# Patient Record
Sex: Female | Born: 1965 | Race: White | Hispanic: No | Marital: Married | Smoking: Never smoker
Health system: Southern US, Community
[De-identification: ages and names within clinical notes are randomized; demographics above are authoritative.]

## PROBLEM LIST (undated history)

## (undated) DIAGNOSIS — I739 Peripheral vascular disease, unspecified: Secondary | ICD-10-CM

## (undated) DIAGNOSIS — G43009 Migraine without aura, not intractable, without status migrainosus: Secondary | ICD-10-CM

## (undated) DIAGNOSIS — E559 Vitamin D deficiency, unspecified: Secondary | ICD-10-CM

## (undated) DIAGNOSIS — E034 Atrophy of thyroid (acquired): Secondary | ICD-10-CM

## (undated) DIAGNOSIS — E063 Autoimmune thyroiditis: Secondary | ICD-10-CM

## (undated) DIAGNOSIS — K219 Gastro-esophageal reflux disease without esophagitis: Secondary | ICD-10-CM

## (undated) DIAGNOSIS — R5382 Chronic fatigue, unspecified: Secondary | ICD-10-CM

## (undated) DIAGNOSIS — N951 Menopausal and female climacteric states: Secondary | ICD-10-CM

## (undated) DIAGNOSIS — F411 Generalized anxiety disorder: Secondary | ICD-10-CM

## (undated) HISTORY — DX: Vitamin D deficiency, unspecified: E55.9

## (undated) HISTORY — DX: Chronic fatigue, unspecified: R53.82

## (undated) HISTORY — DX: Menopausal and female climacteric states: N95.1

## (undated) HISTORY — DX: Gastro-esophageal reflux disease without esophagitis: K21.9

## (undated) HISTORY — PX: TONSILLECTOMY: SUR1361

## (undated) HISTORY — DX: Autoimmune thyroiditis: E06.3

## (undated) HISTORY — DX: Atrophy of thyroid (acquired): E03.4

## (undated) HISTORY — DX: Generalized anxiety disorder: F41.1

## (undated) HISTORY — DX: Migraine without aura, not intractable, without status migrainosus: G43.009

## (undated) SURGERY — Surgical Case
Anesthesia: *Unknown

---

## 1990-08-16 DIAGNOSIS — I739 Peripheral vascular disease, unspecified: Secondary | ICD-10-CM

## 1990-08-16 HISTORY — DX: Peripheral vascular disease, unspecified: I73.9

## 1998-11-07 ENCOUNTER — Other Ambulatory Visit: Admission: RE | Admit: 1998-11-07 | Discharge: 1998-11-07 | Payer: Self-pay | Admitting: Obstetrics and Gynecology

## 1999-12-08 ENCOUNTER — Other Ambulatory Visit: Admission: RE | Admit: 1999-12-08 | Discharge: 1999-12-08 | Payer: Self-pay | Admitting: Emergency Medicine

## 2000-12-13 ENCOUNTER — Other Ambulatory Visit: Admission: RE | Admit: 2000-12-13 | Discharge: 2000-12-13 | Payer: Self-pay | Admitting: *Deleted

## 2002-03-27 ENCOUNTER — Other Ambulatory Visit: Admission: RE | Admit: 2002-03-27 | Discharge: 2002-03-27 | Payer: Self-pay | Admitting: Obstetrics and Gynecology

## 2003-05-22 ENCOUNTER — Other Ambulatory Visit: Admission: RE | Admit: 2003-05-22 | Discharge: 2003-05-22 | Payer: Self-pay | Admitting: Obstetrics and Gynecology

## 2004-09-14 ENCOUNTER — Ambulatory Visit: Payer: Self-pay | Admitting: Family Medicine

## 2004-10-02 ENCOUNTER — Ambulatory Visit: Payer: Self-pay | Admitting: Family Medicine

## 2004-10-06 ENCOUNTER — Ambulatory Visit: Payer: Self-pay | Admitting: Family Medicine

## 2007-08-29 ENCOUNTER — Encounter: Admission: RE | Admit: 2007-08-29 | Discharge: 2007-08-29 | Payer: Self-pay | Admitting: Obstetrics and Gynecology

## 2007-08-31 ENCOUNTER — Encounter: Admission: RE | Admit: 2007-08-31 | Discharge: 2007-08-31 | Payer: Self-pay | Admitting: Obstetrics and Gynecology

## 2008-09-02 ENCOUNTER — Encounter: Admission: RE | Admit: 2008-09-02 | Discharge: 2008-09-02 | Payer: Self-pay | Admitting: Obstetrics and Gynecology

## 2009-09-04 ENCOUNTER — Encounter: Admission: RE | Admit: 2009-09-04 | Discharge: 2009-09-04 | Payer: Self-pay | Admitting: Obstetrics and Gynecology

## 2010-08-16 HISTORY — PX: OTHER SURGICAL HISTORY: SHX169

## 2010-09-06 ENCOUNTER — Encounter: Payer: Self-pay | Admitting: Obstetrics and Gynecology

## 2010-09-07 ENCOUNTER — Encounter
Admission: RE | Admit: 2010-09-07 | Discharge: 2010-09-07 | Payer: Self-pay | Source: Home / Self Care | Attending: Obstetrics and Gynecology | Admitting: Obstetrics and Gynecology

## 2011-01-13 ENCOUNTER — Ambulatory Visit
Admission: RE | Admit: 2011-01-13 | Discharge: 2011-01-13 | Disposition: A | Discharge status: Home / Self Care | Payer: 59 | Source: Ambulatory Visit | Attending: Obstetrics and Gynecology | Admitting: Obstetrics and Gynecology

## 2011-01-13 ENCOUNTER — Other Ambulatory Visit: Payer: Self-pay | Admitting: Obstetrics and Gynecology

## 2011-01-13 DIAGNOSIS — R109 Unspecified abdominal pain: Secondary | ICD-10-CM

## 2011-01-13 DIAGNOSIS — N200 Calculus of kidney: Secondary | ICD-10-CM

## 2011-01-13 DIAGNOSIS — N23 Unspecified renal colic: Secondary | ICD-10-CM

## 2011-03-17 ENCOUNTER — Encounter (HOSPITAL_COMMUNITY): Payer: Self-pay | Admitting: *Deleted

## 2011-04-07 NOTE — H&P (Signed)
[name], [name]               ACCOUNT NO.:  [id number]  MEDICAL RECORD NO.:  [id number]  LOCATION:  [facility]                         FACILITY:  [facility]  PHYSICIAN:  Lenoard Aden, M.D.DATE OF BIRTH:  1966/04/03  DATE OF ADMISSION:  03/16/2011 DATE OF DISCHARGE:                             HISTORY & PHYSICAL   CHIEF COMPLAINT:  Refractory dysfunctional uterine bleeding with normal labs and normal hysterogram.  HISTORY OF PRESENT ILLNESS:  This is a 45 year old white female G1, P1 with a history of negative workup with dysfunctional bleeding.  She has had normal TSH and normal sonohysterogram who now presents for definitive therapy.  MEDICATIONS:  Synthroid, Tums.  ALLERGIES:  She has no known drug allergies.  FAMILY HISTORY:  Chronic hypertension, diabetes, and myocardial infarction.  She has a history of multiple pregnancy.  SURGICAL HISTORY:  Remarkable for laser surgery.  PHYSICAL EXAMINATION:  GENERAL:  She is a well-developed, well-nourished white female, in no acute distress. VITAL SIGNS:  Blood pressure 154/84, weight of 170 pounds. HEENT:  Normal. NECK:  Supple.  Full range of motion. LUNGS:  Clear. HEART:  Regular rate and rhythm. ABDOMEN:  Soft and nontender. PELVIC:  Anteflexed uterus.  No adnexal masses. EXTREMITIES:  No cords. NEUROLOGIC:  Nonfocal. SKIN:  Intact.  IMPRESSION:  Refractory dysfunctional uterine bleeding with negative workup for __________and proceed with diagnostic __________ NovaSure endometrial ablation.  Risks of anesthesia, infection, bleeding, injury to abdominal organs __________delayed versus immediate complications of bowel and bladder __________.     Lenoard Aden, M.D.     RJT/MEDQ  D:  04/07/2011  T:  04/07/2011  Job:  [id number]

## 2011-04-08 ENCOUNTER — Other Ambulatory Visit: Payer: Self-pay | Admitting: Obstetrics and Gynecology

## 2011-04-08 ENCOUNTER — Encounter (HOSPITAL_COMMUNITY): Payer: Self-pay | Admitting: Anesthesiology

## 2011-04-08 ENCOUNTER — Encounter (HOSPITAL_COMMUNITY)
Admission: RE | Disposition: A | Discharge status: Home / Self Care | Payer: Self-pay | Source: Ambulatory Visit | Attending: Obstetrics and Gynecology

## 2011-04-08 ENCOUNTER — Ambulatory Visit (HOSPITAL_COMMUNITY): Payer: 59 | Admitting: Anesthesiology

## 2011-04-08 ENCOUNTER — Ambulatory Visit (HOSPITAL_COMMUNITY)
Admission: RE | Admit: 2011-04-08 | Discharge: 2011-04-08 | Disposition: A | Discharge status: Home / Self Care | Payer: 59 | Source: Ambulatory Visit | Attending: Obstetrics and Gynecology | Admitting: Obstetrics and Gynecology

## 2011-04-08 DIAGNOSIS — N840 Polyp of corpus uteri: Secondary | ICD-10-CM | POA: Insufficient documentation

## 2011-04-08 DIAGNOSIS — N925 Other specified irregular menstruation: Secondary | ICD-10-CM | POA: Insufficient documentation

## 2011-04-08 DIAGNOSIS — N938 Other specified abnormal uterine and vaginal bleeding: Secondary | ICD-10-CM | POA: Insufficient documentation

## 2011-04-08 DIAGNOSIS — N949 Unspecified condition associated with female genital organs and menstrual cycle: Secondary | ICD-10-CM | POA: Insufficient documentation

## 2011-04-08 DIAGNOSIS — N920 Excessive and frequent menstruation with regular cycle: Secondary | ICD-10-CM

## 2011-04-08 HISTORY — DX: Peripheral vascular disease, unspecified: I73.9

## 2011-04-08 LAB — CBC
HCT: 39.7 % (ref 36.0–46.0)
Hemoglobin: 13.4 g/dL (ref 12.0–15.0)
MCH: 32.5 pg (ref 26.0–34.0)
MCHC: 33.8 g/dL (ref 30.0–36.0)
Platelets: 288 10*3/uL (ref 150–400)

## 2011-04-08 SURGERY — DILATATION & CURETTAGE/HYSTEROSCOPY WITH NOVASURE ABLATION
Anesthesia: General | Site: Uterus | Wound class: Clean Contaminated

## 2011-04-08 MED ORDER — FENTANYL CITRATE 0.05 MG/ML IJ SOLN
INTRAMUSCULAR | Status: DC | PRN
  Administered 2011-04-08 (×2): 50 ug via INTRAVENOUS

## 2011-04-08 MED ORDER — ONDANSETRON HCL 4 MG/2ML IJ SOLN
INTRAMUSCULAR | Status: AC
  Filled 2011-04-08: qty 2

## 2011-04-08 MED ORDER — PROPOFOL 10 MG/ML IV EMUL
INTRAVENOUS | Status: AC
  Filled 2011-04-08: qty 20

## 2011-04-08 MED ORDER — LACTATED RINGERS IR SOLN
Status: DC | PRN
  Administered 2011-04-08: 3000 mL

## 2011-04-08 MED ORDER — KETOROLAC TROMETHAMINE 30 MG/ML IJ SOLN: 15.0000 mg | Freq: Once | INTRAMUSCULAR | Status: DC | PRN

## 2011-04-08 MED ORDER — FENTANYL CITRATE 0.05 MG/ML IJ SOLN
INTRAMUSCULAR | Status: AC
  Filled 2011-04-08: qty 2

## 2011-04-08 MED ORDER — LACTATED RINGERS IV SOLN
INTRAVENOUS | Status: DC
  Administered 2011-04-08: 10:00:00 via INTRAVENOUS

## 2011-04-08 MED ORDER — PROPOFOL 10 MG/ML IV EMUL
INTRAVENOUS | Status: DC | PRN
  Administered 2011-04-08: 170 mg via INTRAVENOUS
  Administered 2011-04-08: 30 mg via INTRAVENOUS

## 2011-04-08 MED ORDER — MIDAZOLAM HCL 5 MG/5ML IJ SOLN
INTRAMUSCULAR | Status: DC | PRN
  Administered 2011-04-08: 2 mg via INTRAVENOUS

## 2011-04-08 MED ORDER — KETOROLAC TROMETHAMINE 30 MG/ML IJ SOLN
INTRAMUSCULAR | Status: DC | PRN
  Administered 2011-04-08: 30 mg via INTRAVENOUS

## 2011-04-08 MED ORDER — MIDAZOLAM HCL 2 MG/2ML IJ SOLN
INTRAMUSCULAR | Status: AC
  Filled 2011-04-08: qty 2

## 2011-04-08 MED ORDER — KETOROLAC TROMETHAMINE 30 MG/ML IJ SOLN
INTRAMUSCULAR | Status: AC
  Filled 2011-04-08: qty 1

## 2011-04-08 MED ORDER — LIDOCAINE HCL (CARDIAC) 20 MG/ML IV SOLN
INTRAVENOUS | Status: AC
  Filled 2011-04-08: qty 5

## 2011-04-08 MED ORDER — DEXAMETHASONE SODIUM PHOSPHATE 4 MG/ML IJ SOLN
INTRAMUSCULAR | Status: DC | PRN
  Administered 2011-04-08: 10 mg via INTRAVENOUS

## 2011-04-08 MED ORDER — BUPIVACAINE HCL (PF) 0.25 % IJ SOLN
INTRAMUSCULAR | Status: DC | PRN
  Administered 2011-04-08: 30 mL

## 2011-04-08 MED ORDER — OXYCODONE-ACETAMINOPHEN 5-325 MG PO TABS: 1.0000 | ORAL_TABLET | ORAL | Status: AC | PRN

## 2011-04-08 MED ORDER — FENTANYL CITRATE 0.05 MG/ML IJ SOLN
25.0000 ug | INTRAMUSCULAR | Status: DC | PRN
  Administered 2011-04-08: 50 ug via INTRAVENOUS

## 2011-04-08 MED ORDER — ONDANSETRON HCL 4 MG/2ML IJ SOLN
INTRAMUSCULAR | Status: DC | PRN
  Administered 2011-04-08: 4 mg via INTRAVENOUS

## 2011-04-08 MED ORDER — LIDOCAINE HCL (CARDIAC) 20 MG/ML IV SOLN
INTRAVENOUS | Status: DC | PRN
  Administered 2011-04-08 (×2): 30 mg via INTRAVENOUS

## 2011-04-08 SURGICAL SUPPLY — 14 items
ABLATOR ENDOMETRIAL BIPOLAR (ABLATOR) ×2 IMPLANT
CATH ROBINSON RED A/P 16FR (CATHETERS) ×2 IMPLANT
CLOTH BEACON ORANGE TIMEOUT ST (SAFETY) ×2 IMPLANT
CONTAINER PREFILL 10% NBF 60ML (FORM) ×3 IMPLANT
GLOVE BIO SURGEON STRL SZ7.5 (GLOVE) ×4 IMPLANT
GOWN PREVENTION PLUS LG XLONG (DISPOSABLE) ×2 IMPLANT
GOWN PREVENTION PLUS XLARGE (GOWN DISPOSABLE) ×2 IMPLANT
NDL SPNL 22GX3.5 QUINCKE BK (NEEDLE) ×1 IMPLANT
NEEDLE SPNL 22GX3.5 QUINCKE BK (NEEDLE) ×2 IMPLANT
PACK HYSTEROSCOPY LF (CUSTOM PROCEDURE TRAY) ×2 IMPLANT
PAD PREP 24X48 CUFFED NSTRL (MISCELLANEOUS) ×2 IMPLANT
SYR TB 1ML 25GX5/8 (SYRINGE) ×1 IMPLANT
TOWEL OR 17X24 6PK STRL BLUE (TOWEL DISPOSABLE) ×4 IMPLANT
WATER STERILE IRR 1000ML POUR (IV SOLUTION) ×1 IMPLANT

## 2011-04-08 NOTE — Anesthesia Preprocedure Evaluation (Signed)
Anesthesia Evaluation  Name, MR# and DOB Patient awake  General Assessment Comment  Reviewed: Allergy & Precautions, H&P , NPO status , Patient's Chart, lab work & pertinent test results, reviewed documented beta blocker date and time   History of Anesthesia Complications Negative for: history of anesthetic complications  Airway Mallampati: II TM Distance: >3 FB Neck ROM: full    Dental  (+) Teeth Intact   Pulmonary  clear to auscultation  breath sounds clear to auscultation none    Cardiovascular Exercise Tolerance: Good regular Normal PE from DVT while on OCPs and leg in a cast, 1992PE from DVT while on OCPs and leg in a cast, 1992:     Neuro/Psych Negative Neurological ROS  Negative Psych ROS  GI/Hepatic/Renal negative GI ROS  negative Liver ROS  negative Renal ROS        Endo/Other  (+) Hypothyroidism,      Abdominal   Musculoskeletal   Hematology negative hematology ROS (+)   Peds  Reproductive/Obstetrics negative OB ROS    Anesthesia Other Findings             Anesthesia Physical Anesthesia Plan  ASA: II  Anesthesia Plan: General   Post-op Pain Management:    Induction:   Airway Management Planned:   Additional Equipment:   Intra-op Plan:   Post-operative Plan:   Informed Consent: I have reviewed the patients History and Physical, chart, labs and discussed the procedure including the risks, benefits and alternatives for the proposed anesthesia with the patient or authorized representative who has indicated his/her understanding and acceptance.   Dental Advisory Given  Plan Discussed with: CRNA and Surgeon  Anesthesia Plan Comments:         Anesthesia Quick Evaluation

## 2011-04-08 NOTE — Anesthesia Procedure Notes (Addendum)
Procedure Name: LMA Insertion Date/Time: 04/08/2011 10:54 AM Performed by: Suella Grove Pre-anesthesia Checklist: Patient identified, Patient being monitored, Timeout performed, Emergency Drugs available and Suction available Patient Re-evaluated:Patient Re-evaluated prior to inductionOxygen Delivery Method: Circle System Utilized Preoxygenation: Pre-oxygenation with 100% oxygen Intubation Type: IV induction Ventilation: Mask ventilation without difficulty LMA: LMA with gastric port inserted LMA Size: 3.0 Grade View: Grade I Number of attempts: 1 Placement Confirmation: breath sounds checked- equal and bilateral and positive ETCO2 Dental Injury: Teeth and Oropharynx as per pre-operative assessment

## 2011-04-08 NOTE — Op Note (Signed)
[name], [name]               ACCOUNT NO.:  [id number]  MEDICAL RECORD NO.:  [id number]  LOCATION:  [facility]                          FACILITY:  [facility]  PHYSICIAN:  Lenoard Aden, M.D.DATE OF BIRTH:  06-25-1966  DATE OF PROCEDURE:  04/08/2011 DATE OF DISCHARGE:  04/08/2011                              OPERATIVE REPORT   PREOPERATIVE DIAGNOSIS:  Refractory menometrorrhagia.  POSTOPERATIVE DIAGNOSIS:  Refractory menometrorrhagia plus endometrial polyp.  PROCEDURE:  Diagnostic hysteroscopy, D and C, NovaSure endometrial ablation, endometrial polypectomy.  SURGEON:  Lenoard Aden, MD  ASSISTANT:  None.  ANESTHESIA:  General and local.  ESTIMATED BLOOD LOSS:  Less than 50 mL.  FLUID DEFICIT:  50 mL.  COMPLICATIONS:  None.  DRAINS:  None.  COUNTS:  Correct.  The patient to recovery in good condition.  After being apprised of the risks of anesthesia, infection, bleeding, injury to abdominal organs, need for repair, delayed versus immediate complications include bowel and bladder injury, possible need for repair.  The patient was brought to the operating room where she was administered a general anesthetic without complications, prepped and draped in usual sterile fashion and catheterized until the bladder was emptied.  Exam under anesthesia revealed a small anteflexed uterus and no adnexal masses.  Cervix was then easily dilated up to a #21 Pratt dilator. After dilute paracervical block with 30 mL of a dilute Marcaine solution placed, at this time, the hysteroscope was placed.  Visualization reveals a small posterior wall endometrial polyp and bilateral normal tubal ostia and otherwise normal endometrial cavity.  Specimen was collected using sharp curettage in a four-quadrant method followed by endometrial polypectomy.  Specimen was sent together.  At this time, measurements were taken for the NovaSure device.  The NovaSure device was seated placed.  CO2 test was  done and was negative.  The NovaSure device was then initiated for a time of 70 seconds to a width of 4.5 and a length of 6.5 without difficulty. At this time, the NovaSure device was removed, intact and the hysteroscope reveals a well-ablated endometrial cavity and no further presence of endometrial polyp.  At this time, the procedure was terminated.  The patient tolerated procedure well, was awakened and transferred to recovery in good condition.     Lenoard Aden, M.D.     RJT/MEDQ  D:  04/08/2011  T:  04/08/2011  Job:  [id number]  cc:   Dr. Ma Hillock

## 2011-04-08 NOTE — Anesthesia Postprocedure Evaluation (Signed)
Anesthesia Post Note  Patient: [name]  Procedure(s) Performed:  DILATATION & CURETTAGE/HYSTEROSCOPY WITH NOVASURE ABLATION  Anesthesia type: General  Patient location: PACU  Post pain: Pain level controlled  Post assessment: Post-op Vital signs reviewed  Last Vitals:  Filed Vitals:   04/08/11 1224  BP:   Pulse:   Temp: 98.4 F (36.9 C)  Resp:     Post vital signs: Reviewed  Level of consciousness: sedated  Complications: No apparent anesthesia complications

## 2011-04-08 NOTE — Progress Notes (Signed)
  No changes. History and physical done.

## 2011-04-08 NOTE — Transfer of Care (Signed)
  Anesthesia Post-op Note  Patient: [name]  Procedure(s) Performed:  DILATATION & CURETTAGE/HYSTEROSCOPY WITH NOVASURE ABLATION  Patient Location: PACU  Anesthesia Type: General  Level of Consciousness: awake, alert , oriented, patient cooperative and responds to stimulation  Airway and Oxygen Therapy: Patient Spontanous Breathing and Patient connected to nasal cannula oxygen  Post-op Pain: none  Post-op Assessment: Post-op Vital signs reviewed, Patient's Cardiovascular Status Stable, Respiratory Function Stable and Pain level controlled  Post-op Vital Signs: Reviewed and stable  Complications: No apparent anesthesia complications

## 2011-04-08 NOTE — Op Note (Signed)
04/08/2011  11:15 AM  PATIENT:  [name]  45 y.o. female  PRE-OPERATIVE DIAGNOSIS:  Menorrhagia refractory to medical therapy  POST-OPERATIVE DIAGNOSIS:  Same plus Endometrial polyp  PROCEDURE:  Procedure(s): DILATATION & CURETTAGE/HYSTEROSCOPY WITH NOVASURE ABLATION Endometrial polypectomy SURGEON:  Surgeon(s): Lenoard Aden, MD  PHYSICIAN ASSISTANT:   ASSISTANTS: none   ANESTHESIA:   local and general  ESTIMATED BLOOD LOSS: * No blood loss amount entered *   BLOOD ADMINISTERED:none  DRAINS: none   LOCAL MEDICATIONS USED:  MARCAINE 30CC  SPECIMEN:  Source of Specimen:  EMC with polyp  DISPOSITION OF SPECIMEN:  PATHOLOGY  COUNTS:  YES  TOURNIQUET:  * No tourniquets in log *  DICTATION #: [id number]  PLAN OF CARE: na  PATIENT DISPOSITION:  PACU - hemodynamically stable.

## 2011-09-20 ENCOUNTER — Other Ambulatory Visit: Payer: Self-pay | Admitting: Obstetrics and Gynecology

## 2011-09-20 DIAGNOSIS — Z1231 Encounter for screening mammogram for malignant neoplasm of breast: Secondary | ICD-10-CM

## 2011-09-28 ENCOUNTER — Ambulatory Visit
Admission: RE | Admit: 2011-09-28 | Discharge: 2011-09-28 | Disposition: A | Discharge status: Home / Self Care | Payer: 59 | Source: Ambulatory Visit | Attending: Obstetrics and Gynecology | Admitting: Obstetrics and Gynecology

## 2011-09-28 DIAGNOSIS — Z1231 Encounter for screening mammogram for malignant neoplasm of breast: Secondary | ICD-10-CM

## 2012-09-19 ENCOUNTER — Other Ambulatory Visit: Payer: Self-pay | Admitting: Obstetrics and Gynecology

## 2012-09-19 DIAGNOSIS — Z803 Family history of malignant neoplasm of breast: Secondary | ICD-10-CM

## 2012-09-19 DIAGNOSIS — Z1231 Encounter for screening mammogram for malignant neoplasm of breast: Secondary | ICD-10-CM

## 2012-09-28 ENCOUNTER — Ambulatory Visit: Payer: 59

## 2012-10-30 ENCOUNTER — Ambulatory Visit
Admission: RE | Admit: 2012-10-30 | Discharge: 2012-10-30 | Disposition: A | Discharge status: Home / Self Care | Payer: 59 | Source: Ambulatory Visit | Attending: Obstetrics and Gynecology | Admitting: Obstetrics and Gynecology

## 2012-10-30 DIAGNOSIS — Z803 Family history of malignant neoplasm of breast: Secondary | ICD-10-CM

## 2012-10-30 DIAGNOSIS — Z1231 Encounter for screening mammogram for malignant neoplasm of breast: Secondary | ICD-10-CM

## 2013-08-30 ENCOUNTER — Other Ambulatory Visit: Payer: Self-pay

## 2013-08-30 DIAGNOSIS — Z1231 Encounter for screening mammogram for malignant neoplasm of breast: Secondary | ICD-10-CM

## 2013-11-01 ENCOUNTER — Ambulatory Visit
Admission: RE | Admit: 2013-11-01 | Discharge: 2013-11-01 | Disposition: A | Discharge status: Home / Self Care | Payer: 59 | Source: Ambulatory Visit

## 2013-11-01 DIAGNOSIS — Z1231 Encounter for screening mammogram for malignant neoplasm of breast: Secondary | ICD-10-CM

## 2014-10-01 ENCOUNTER — Other Ambulatory Visit: Payer: Self-pay

## 2014-10-01 DIAGNOSIS — Z1231 Encounter for screening mammogram for malignant neoplasm of breast: Secondary | ICD-10-CM

## 2014-11-04 ENCOUNTER — Ambulatory Visit: Payer: Self-pay

## 2014-12-15 HISTORY — PX: OTHER SURGICAL HISTORY: SHX169

## 2016-12-01 DIAGNOSIS — K219 Gastro-esophageal reflux disease without esophagitis: Secondary | ICD-10-CM | POA: Diagnosis not present

## 2016-12-01 DIAGNOSIS — R6889 Other general symptoms and signs: Secondary | ICD-10-CM | POA: Diagnosis not present

## 2016-12-07 DIAGNOSIS — Z01419 Encounter for gynecological examination (general) (routine) without abnormal findings: Secondary | ICD-10-CM | POA: Diagnosis not present

## 2016-12-07 DIAGNOSIS — R946 Abnormal results of thyroid function studies: Secondary | ICD-10-CM | POA: Diagnosis not present

## 2016-12-07 DIAGNOSIS — Z130 Encounter for screening for diseases of the blood and blood-forming organs and certain disorders involving the immune mechanism: Secondary | ICD-10-CM | POA: Diagnosis not present

## 2016-12-07 DIAGNOSIS — N951 Menopausal and female climacteric states: Secondary | ICD-10-CM | POA: Diagnosis not present

## 2016-12-07 DIAGNOSIS — Z0000 Encounter for general adult medical examination without abnormal findings: Secondary | ICD-10-CM | POA: Diagnosis not present

## 2016-12-15 DIAGNOSIS — K219 Gastro-esophageal reflux disease without esophagitis: Secondary | ICD-10-CM | POA: Diagnosis not present

## 2017-01-07 DIAGNOSIS — Z1211 Encounter for screening for malignant neoplasm of colon: Secondary | ICD-10-CM | POA: Diagnosis not present

## 2017-01-07 DIAGNOSIS — K635 Polyp of colon: Secondary | ICD-10-CM | POA: Diagnosis not present

## 2017-01-07 LAB — HM COLONOSCOPY

## 2017-02-15 DIAGNOSIS — J45909 Unspecified asthma, uncomplicated: Secondary | ICD-10-CM | POA: Diagnosis not present

## 2017-02-15 DIAGNOSIS — J209 Acute bronchitis, unspecified: Secondary | ICD-10-CM | POA: Diagnosis not present

## 2017-02-15 DIAGNOSIS — J0100 Acute maxillary sinusitis, unspecified: Secondary | ICD-10-CM | POA: Diagnosis not present

## 2017-05-06 DIAGNOSIS — D2239 Melanocytic nevi of other parts of face: Secondary | ICD-10-CM | POA: Diagnosis not present

## 2017-05-06 DIAGNOSIS — C4401 Basal cell carcinoma of skin of lip: Secondary | ICD-10-CM | POA: Diagnosis not present

## 2017-05-17 DIAGNOSIS — E039 Hypothyroidism, unspecified: Secondary | ICD-10-CM | POA: Diagnosis not present

## 2017-05-17 DIAGNOSIS — G43909 Migraine, unspecified, not intractable, without status migrainosus: Secondary | ICD-10-CM | POA: Diagnosis not present

## 2017-05-17 DIAGNOSIS — R5381 Other malaise: Secondary | ICD-10-CM | POA: Diagnosis not present

## 2017-06-01 DIAGNOSIS — Z23 Encounter for immunization: Secondary | ICD-10-CM | POA: Diagnosis not present

## 2017-06-06 DIAGNOSIS — N952 Postmenopausal atrophic vaginitis: Secondary | ICD-10-CM | POA: Diagnosis not present

## 2017-06-16 DIAGNOSIS — I83811 Varicose veins of right lower extremities with pain: Secondary | ICD-10-CM | POA: Diagnosis not present

## 2017-06-16 DIAGNOSIS — I8311 Varicose veins of right lower extremity with inflammation: Secondary | ICD-10-CM | POA: Diagnosis not present

## 2017-06-24 DIAGNOSIS — I8312 Varicose veins of left lower extremity with inflammation: Secondary | ICD-10-CM | POA: Diagnosis not present

## 2017-08-04 DIAGNOSIS — I83811 Varicose veins of right lower extremities with pain: Secondary | ICD-10-CM | POA: Diagnosis not present

## 2017-08-04 DIAGNOSIS — I8311 Varicose veins of right lower extremity with inflammation: Secondary | ICD-10-CM | POA: Diagnosis not present

## 2017-08-18 DIAGNOSIS — I824Z1 Acute embolism and thrombosis of unspecified deep veins of right distal lower extremity: Secondary | ICD-10-CM | POA: Diagnosis not present

## 2017-10-03 DIAGNOSIS — I824Z1 Acute embolism and thrombosis of unspecified deep veins of right distal lower extremity: Secondary | ICD-10-CM | POA: Diagnosis not present

## 2017-10-13 DIAGNOSIS — I82409 Acute embolism and thrombosis of unspecified deep veins of unspecified lower extremity: Secondary | ICD-10-CM | POA: Diagnosis not present

## 2017-10-25 DIAGNOSIS — Z86718 Personal history of other venous thrombosis and embolism: Secondary | ICD-10-CM

## 2017-10-25 DIAGNOSIS — I82441 Acute embolism and thrombosis of right tibial vein: Secondary | ICD-10-CM

## 2017-10-25 DIAGNOSIS — Z7901 Long term (current) use of anticoagulants: Secondary | ICD-10-CM | POA: Diagnosis not present

## 2017-11-02 DIAGNOSIS — Z86718 Personal history of other venous thrombosis and embolism: Secondary | ICD-10-CM | POA: Diagnosis not present

## 2017-11-02 DIAGNOSIS — Z808 Family history of malignant neoplasm of other organs or systems: Secondary | ICD-10-CM | POA: Diagnosis not present

## 2017-11-02 DIAGNOSIS — Z803 Family history of malignant neoplasm of breast: Secondary | ICD-10-CM | POA: Diagnosis not present

## 2017-11-02 DIAGNOSIS — Z8051 Family history of malignant neoplasm of kidney: Secondary | ICD-10-CM | POA: Diagnosis not present

## 2017-11-02 DIAGNOSIS — Z7901 Long term (current) use of anticoagulants: Secondary | ICD-10-CM | POA: Diagnosis not present

## 2017-11-09 DIAGNOSIS — R002 Palpitations: Secondary | ICD-10-CM | POA: Diagnosis not present

## 2017-11-09 DIAGNOSIS — R200 Anesthesia of skin: Secondary | ICD-10-CM | POA: Diagnosis not present

## 2017-11-09 DIAGNOSIS — R42 Dizziness and giddiness: Secondary | ICD-10-CM | POA: Diagnosis not present

## 2017-11-10 DIAGNOSIS — R002 Palpitations: Secondary | ICD-10-CM | POA: Diagnosis not present

## 2017-11-14 DIAGNOSIS — Z87898 Personal history of other specified conditions: Secondary | ICD-10-CM | POA: Diagnosis not present

## 2017-11-14 DIAGNOSIS — J302 Other seasonal allergic rhinitis: Secondary | ICD-10-CM | POA: Diagnosis not present

## 2017-11-15 DIAGNOSIS — I824Z1 Acute embolism and thrombosis of unspecified deep veins of right distal lower extremity: Secondary | ICD-10-CM | POA: Diagnosis not present

## 2017-12-09 DIAGNOSIS — J329 Chronic sinusitis, unspecified: Secondary | ICD-10-CM | POA: Diagnosis not present

## 2017-12-09 DIAGNOSIS — J40 Bronchitis, not specified as acute or chronic: Secondary | ICD-10-CM | POA: Diagnosis not present

## 2017-12-12 DIAGNOSIS — R87610 Atypical squamous cells of undetermined significance on cytologic smear of cervix (ASC-US): Secondary | ICD-10-CM | POA: Diagnosis not present

## 2017-12-12 DIAGNOSIS — E039 Hypothyroidism, unspecified: Secondary | ICD-10-CM | POA: Diagnosis not present

## 2017-12-12 DIAGNOSIS — Z1231 Encounter for screening mammogram for malignant neoplasm of breast: Secondary | ICD-10-CM | POA: Diagnosis not present

## 2017-12-12 DIAGNOSIS — Z01419 Encounter for gynecological examination (general) (routine) without abnormal findings: Secondary | ICD-10-CM | POA: Diagnosis not present

## 2017-12-26 DIAGNOSIS — R42 Dizziness and giddiness: Secondary | ICD-10-CM | POA: Diagnosis not present

## 2017-12-26 DIAGNOSIS — H9313 Tinnitus, bilateral: Secondary | ICD-10-CM | POA: Diagnosis not present

## 2017-12-26 DIAGNOSIS — H8123 Vestibular neuronitis, bilateral: Secondary | ICD-10-CM | POA: Diagnosis not present

## 2017-12-27 DIAGNOSIS — I824Z1 Acute embolism and thrombosis of unspecified deep veins of right distal lower extremity: Secondary | ICD-10-CM | POA: Diagnosis not present

## 2017-12-29 DIAGNOSIS — I8311 Varicose veins of right lower extremity with inflammation: Secondary | ICD-10-CM | POA: Diagnosis not present

## 2018-02-15 DIAGNOSIS — E039 Hypothyroidism, unspecified: Secondary | ICD-10-CM | POA: Diagnosis not present

## 2018-03-16 DIAGNOSIS — E034 Atrophy of thyroid (acquired): Secondary | ICD-10-CM | POA: Diagnosis not present

## 2018-03-16 DIAGNOSIS — Z5181 Encounter for therapeutic drug level monitoring: Secondary | ICD-10-CM | POA: Diagnosis not present

## 2018-03-16 DIAGNOSIS — K219 Gastro-esophageal reflux disease without esophagitis: Secondary | ICD-10-CM | POA: Diagnosis not present

## 2018-05-16 DIAGNOSIS — Z23 Encounter for immunization: Secondary | ICD-10-CM | POA: Diagnosis not present

## 2018-05-16 DIAGNOSIS — E034 Atrophy of thyroid (acquired): Secondary | ICD-10-CM | POA: Diagnosis not present

## 2018-06-06 DIAGNOSIS — E034 Atrophy of thyroid (acquired): Secondary | ICD-10-CM | POA: Diagnosis not present

## 2018-06-06 DIAGNOSIS — R072 Precordial pain: Secondary | ICD-10-CM | POA: Diagnosis not present

## 2018-06-19 DIAGNOSIS — B029 Zoster without complications: Secondary | ICD-10-CM | POA: Diagnosis not present

## 2018-07-18 DIAGNOSIS — E034 Atrophy of thyroid (acquired): Secondary | ICD-10-CM | POA: Diagnosis not present

## 2018-07-18 DIAGNOSIS — Z0000 Encounter for general adult medical examination without abnormal findings: Secondary | ICD-10-CM | POA: Diagnosis not present

## 2018-07-18 DIAGNOSIS — Z6828 Body mass index (BMI) 28.0-28.9, adult: Secondary | ICD-10-CM | POA: Diagnosis not present

## 2018-09-14 DIAGNOSIS — R87610 Atypical squamous cells of undetermined significance on cytologic smear of cervix (ASC-US): Secondary | ICD-10-CM | POA: Diagnosis not present

## 2018-09-14 DIAGNOSIS — N840 Polyp of corpus uteri: Secondary | ICD-10-CM | POA: Diagnosis not present

## 2018-10-05 DIAGNOSIS — J018 Other acute sinusitis: Secondary | ICD-10-CM | POA: Diagnosis not present

## 2018-10-26 DIAGNOSIS — J329 Chronic sinusitis, unspecified: Secondary | ICD-10-CM | POA: Diagnosis not present

## 2018-10-30 ENCOUNTER — Other Ambulatory Visit: Payer: Self-pay | Admitting: Otolaryngology

## 2018-10-30 DIAGNOSIS — J329 Chronic sinusitis, unspecified: Secondary | ICD-10-CM

## 2018-11-02 ENCOUNTER — Other Ambulatory Visit: Payer: Self-pay | Admitting: Otolaryngology

## 2018-11-02 DIAGNOSIS — J329 Chronic sinusitis, unspecified: Secondary | ICD-10-CM

## 2018-11-06 ENCOUNTER — Ambulatory Visit
Admission: RE | Admit: 2018-11-06 | Discharge: 2018-11-06 | Disposition: A | Discharge status: Home / Self Care | Payer: 59 | Source: Ambulatory Visit | Attending: Otolaryngology | Admitting: Otolaryngology

## 2018-11-06 ENCOUNTER — Other Ambulatory Visit: Payer: Self-pay

## 2018-11-06 DIAGNOSIS — J329 Chronic sinusitis, unspecified: Secondary | ICD-10-CM

## 2018-11-07 DIAGNOSIS — J342 Deviated nasal septum: Secondary | ICD-10-CM | POA: Diagnosis not present

## 2018-11-07 DIAGNOSIS — J300 Vasomotor rhinitis: Secondary | ICD-10-CM | POA: Diagnosis not present

## 2018-12-21 DIAGNOSIS — K219 Gastro-esophageal reflux disease without esophagitis: Secondary | ICD-10-CM | POA: Diagnosis not present

## 2018-12-21 DIAGNOSIS — G43009 Migraine without aura, not intractable, without status migrainosus: Secondary | ICD-10-CM | POA: Diagnosis not present

## 2018-12-21 DIAGNOSIS — E034 Atrophy of thyroid (acquired): Secondary | ICD-10-CM | POA: Diagnosis not present

## 2019-04-02 LAB — HM MAMMOGRAPHY

## 2019-05-18 ENCOUNTER — Other Ambulatory Visit: Payer: Self-pay

## 2019-05-22 ENCOUNTER — Ambulatory Visit: Payer: 59 | Admitting: Internal Medicine

## 2019-05-24 ENCOUNTER — Encounter: Payer: Self-pay | Admitting: Internal Medicine

## 2019-05-24 ENCOUNTER — Other Ambulatory Visit: Payer: Self-pay

## 2019-05-24 ENCOUNTER — Ambulatory Visit (INDEPENDENT_AMBULATORY_CARE_PROVIDER_SITE_OTHER): Payer: 59 | Admitting: Internal Medicine

## 2019-05-24 VITALS — BP 98/60 | HR 84 | Ht 65.0 in | Wt 176.0 lb

## 2019-05-24 DIAGNOSIS — E039 Hypothyroidism, unspecified: Secondary | ICD-10-CM | POA: Diagnosis not present

## 2019-05-24 DIAGNOSIS — M2550 Pain in unspecified joint: Secondary | ICD-10-CM | POA: Insufficient documentation

## 2019-05-24 LAB — TSH: TSH: 1.43 u[IU]/mL (ref 0.35–4.50)

## 2019-05-24 LAB — VITAMIN D 25 HYDROXY (VIT D DEFICIENCY, FRACTURES): VITD: 26.8 ng/mL — ABNORMAL LOW (ref 30.00–100.00)

## 2019-05-24 LAB — T3, FREE: T3, Free: 2.9 pg/mL (ref 2.3–4.2)

## 2019-05-24 LAB — T4, FREE: Free T4: 0.69 ng/dL (ref 0.60–1.60)

## 2019-05-24 NOTE — Patient Instructions (Signed)
  Please stop at the lab.  Continue Synthroid 75 mcg daily.  Take the thyroid hormone every day, with water, at least 30 minutes before breakfast, separated by at least 4 hours from: - acid reflux medications - calcium - iron - multivitamins  Please return in 4 months.

## 2019-05-24 NOTE — Progress Notes (Signed)
Patient ID: [name], female   DOB: Dec 26, 1965, 53 y.o.   MRN: [id number]    HPI  [name] is a 53 y.o.-year-old female, referred by her PCP, Dr. Ronita Hipps, for management of hypothyroidism.  Pt. has been dx with hypothyroidism in 2007>> on Synthroid d.a.w. 75 mcg.  Her hypothyroidism was previously managed by Dr. Ronita Hipps and more recently by her PCP.  Her dose was reduced in the last year from 112 down to 75 mcg daily, dose that she continues today.  She has several symptoms that feels may be related to the thyroid and she would like to know whether her thyroid regimen is optimal.  She takes the thyroid hormone: - fasting - with water - separated by >30 min from b'fast  - no calcium, iron, PPIs, multivitamins   I reviewed pt's thyroid tests: 04/02/2019: TSH 1.81 (0.4-4.0) - on 75 mcg daily 02/15/2018: TSH TSH 0.23 - on 112 mcg daily >> dose decreased to 88 mcg daily No results found for: TSH, FREET4, T3FREE  Antithyroid antibodies: No results found for: THGAB No components found for: TPOAB  Pt describes: - + fatigue  - + foggy brain - + tender neck - + insomnia - + weight gain - in last 5 years: 50 lbs - + joint pain - + heat and cold intolerance - + depression - + constipation - + dry skin - + hair loss  Pt denies feeling nodules in neck, hoarseness, dysphagia/odynophagia, SOB with lying down.  She has + FH of thyroid disorders in: M aunt and uncle. No FH of thyroid cancer.  No h/o radiation tx to head or neck. No recent use of iodine supplements. She was on [employer]- Ashwaganda, Iodine, L-thyroine - stopped 1-2 weeks ago 2/2 GERD.  Pt. also has a history of depression and anxiety-on clonazepam and Lexapro.  ROS: Constitutional: + See HPI Eyes: No blurry vision, + xerophthalmia ENT: no sore throat, no nodules palpated in neck, no dysphagia/odynophagia, + hoarseness, + tinnitus, + hypoacusis Cardiovascular: + CP-acid reflux/+ SOB/no palpitations/+ leg  swelling Respiratory: no cough/+ SOB/+ wheezing Gastrointestinal: + N/no V/D/+ C, + heartburn Musculoskeletal: + muscle aches/+ joint aches Skin: no rashes, + itching, + hair loss Neurological: no tremors/numbness/tingling/dizziness, + HA Psychiatric: + Both depression/anxiety  Past Medical History:  Diagnosis Date  . Peripheral vascular disease ([place]) 1992   blood clot with broken leg, coumadin x 6 mos   Past Surgical History:  Procedure Laterality Date  . TONSILLECTOMY     as a child   Social History   Socioeconomic History  . Marital status: Married    Spouse name: Not on file  . Number of children: 19 -63 years old in 05/2019 she also takes care of her 63-year-old niece  . Years of education: Not on file  . Highest education level: Not on file  Occupational History  .  Hairstylist  Social Needs  . Financial resource strain: Not on file  . Food insecurity    Worry: Not on file    Inability: Not on file  . Transportation needs    Medical: Not on file    Non-medical: Not on file  Tobacco Use  . Smoking status: Never Smoker  . Smokeless tobacco: Never Used  Substance and Sexual Activity  . Alcohol use: Yes    Alcohol/week:     Types:  3 drinks per week-wine or beer  . Drug use: No  . Sexual activity: Yes    Birth  control/protection: Surgical    Comment: husband had vasectomy   Current Outpatient Medications on File Prior to Visit  Medication Sig Dispense Refill  . clonazePAM (KLONOPIN) 0.5 MG tablet Take 0.5 mg by mouth daily as needed.    Marland Kitchen escitalopram (LEXAPRO) 10 MG tablet Take 10 mg by mouth daily.    Marland Kitchen ibuprofen (ADVIL,MOTRIN) 200 MG tablet Take 400 mg by mouth daily as needed. For headache     . levothyroxine (SYNTHROID, LEVOTHROID) 75 MCG tablet Take 75 mcg by mouth daily.      Marland Kitchen PREMARIN vaginal cream APPLY 1/2 GRAM VIA APPLICATOR EVERY NIGHT FOR 3 WEEKS THEN TWICE WEEKLY    . SUMAtriptan (IMITREX) 100 MG tablet      No current facility-administered  medications on file prior to visit.    No Known Allergies   Family history:  Diabetes in mother and grandmother HTN and HL in mother and maternal aunt Heart disease in mother, maternal grandfather Breast and skin cancer in mother Also, biological father died of cancer  PE: BP 98/60   Pulse 84   Ht 5\' 5"  (1.651 m) Comment: measured without shoes  Wt 176 lb (79.8 kg)   BMI 29.29 kg/m  Wt Readings from Last 3 Encounters:  05/24/19 176 lb (79.8 kg)  04/08/11 165 lb (74.8 kg)   Constitutional: overweight, in NAD Eyes: PERRLA, EOMI, no exophthalmos ENT: moist mucous membranes, no thyromegaly, no cervical lymphadenopathy Cardiovascular: RRR, No MRG Respiratory: CTA B Gastrointestinal: abdomen soft, NT, ND, BS+ Musculoskeletal: no deformities, strength intact in all 4 Skin: moist, warm, no rashes Neurological: no tremor with outstretched hands, DTR normal in all 4  ASSESSMENT: 1. Hypothyroidism  2. Joint pain  PLAN:  1. Patient with long-standing hypothyroidism, on levothyroxine therapy, with normal TFTs obtained in 03/2019.  Her dose of levothyroxine had been decreased based on her TFTs.  Per review of the records brought by patient, a TSH was suppressed on her previous dose of 112 mcg daily, so the decrease in dose was required.  However, she feels too tired and has many nonspecific symptoms, which may be related to thyroid but could also be related to menopause, depression, insomnia, deconditioning. - At this visit, we discussed about rechecking her TFTs and I will also add TPO and ATA antibodies to screen her for Hashimoto's thyroiditis.  She does not have a previous history of this.  We discussed that if she does have positive antibodies, her condition is not managed differently, but at least we have a more precise diagnosis. - she does not appear to have a goiter, thyroid nodules, or neck compression symptoms - We discussed about correct intake of levothyroxine, fasting, with  water, separated by at least 30 minutes from breakfast, and separated by more than 4 hours from calcium, iron, multivitamins, acid reflux medications (PPIs).  She is taking it correctly. - will check thyroid tests today: TSH, free T4, free T3, TPO and ATA antibodies -After the results are back, I suggested to try to change to Armour thyroid. We discussed about positive and negative aspects of using Armour thyroid. I underlined the fact that:  Armour is purified from porcine thyroid glands, which is not without risk for contaminants  Also, the ratio between T4 and T3 in Armour is physiologic for pigs, not for humans.  The concentration of the active substances (T4 and T3) can be expected to vary between different Armour lots, which can cause variation in the thyroid function tests.  -She would  like to try this to see if this improves her wellbeing -I will have her back for labs in 1.5 months after changing her thyroid regimen - I will see her back in 4 months  2. Joint pain -Generalized -We discussed that this would be unlikely to be related to her thyroid condition - will check a vit D level  Orders Placed This Encounter  Procedures  . VITAMIN D 25 Hydroxy (Vit-D Deficiency, Fractures)  . Thyroid peroxidase antibody  . Thyroglobulin antibody  . TSH  . T4, free  . T3, free   Office Visit on 05/24/2019  Component Date Value Ref Range Status  . VITD 05/24/2019 26.80* 30.00 - 100.00 ng/mL Final  . Thyroperoxidase Ab SerPl-aCnc 05/24/2019 98* <9 IU/mL Final  . Thyroglobulin Ab 05/24/2019 <1  < or = 1 IU/mL Final  . TSH 05/24/2019 1.43  0.35 - 4.50 uIU/mL Final  . Free T4 05/24/2019 0.69  0.60 - 1.60 ng/dL Final   Comment: Specimens from patients who are undergoing biotin therapy and /or ingesting biotin supplements may contain high levels of biotin.  The higher biotin concentration in these specimens interferes with this Free T4 assay.  Specimens that contain high levels  of biotin may  cause false high results for this Free T4 assay.  Please interpret results in light of the total clinical presentation of the patient.    . T3, Free 05/24/2019 2.9  2.3 - 4.2 pg/mL Final   TPO antibodies are high, pointing towards Hashimoto's thyroiditis. Her TFTs are normal.  I suggested to switch to a regimen of 45 mg of Armour daily (1/2 tablets of the 90 mg dose).  We will recheck her TFTs in 1.5 months. Vitamin D slightly low so I will suggest to start 2000 units vitamin D daily.  We will recheck this at next visit.  Philemon Kingdom, MD PhD [facility] Endocrinology

## 2019-05-25 ENCOUNTER — Encounter: Payer: Self-pay | Admitting: Internal Medicine

## 2019-05-25 LAB — THYROID PEROXIDASE ANTIBODY: Thyroperoxidase Ab SerPl-aCnc: 98 IU/mL — ABNORMAL HIGH (ref <9)

## 2019-05-25 LAB — THYROGLOBULIN ANTIBODY: Thyroglobulin Ab: <1 IU/mL (ref <=1)

## 2019-05-25 MED ORDER — ARMOUR THYROID 90 MG PO TABS: 45.0000 mg | ORAL_TABLET | Freq: Every day | ORAL | 1 refills | Status: DC

## 2019-05-27 ENCOUNTER — Encounter: Payer: Self-pay | Admitting: Internal Medicine

## 2019-05-28 ENCOUNTER — Other Ambulatory Visit: Payer: Self-pay | Admitting: Internal Medicine

## 2019-05-28 ENCOUNTER — Telehealth: Payer: Self-pay

## 2019-05-28 NOTE — Telephone Encounter (Signed)
Chart reviewed, looks correct, she does not need a follow up until 4 months. Thank you!

## 2019-05-28 NOTE — Telephone Encounter (Signed)
Patient called in saying she needs an appt per Dr. Rene Paci looked in her chart and doesn't look like she needs another appt per her notes until 4 months and that's already scheduled. Please advise if I need to call and schedule her an appt

## 2019-06-12 ENCOUNTER — Telehealth: Payer: Self-pay

## 2019-06-12 NOTE — Telephone Encounter (Signed)
Patient called in stating that the new medication has made her have swollen lympthnodes and wanted to know this a side effect of the medication   Medicine : ARMOUR THYROID 90 MG tablet  Please call and advise

## 2019-06-12 NOTE — Telephone Encounter (Signed)
No, this is not a SE of Armour

## 2019-07-05 ENCOUNTER — Other Ambulatory Visit: Payer: Self-pay

## 2019-07-05 ENCOUNTER — Other Ambulatory Visit (INDEPENDENT_AMBULATORY_CARE_PROVIDER_SITE_OTHER): Payer: 59

## 2019-07-05 DIAGNOSIS — E039 Hypothyroidism, unspecified: Secondary | ICD-10-CM

## 2019-07-05 LAB — T4, FREE: Free T4: 0.44 ng/dL — ABNORMAL LOW (ref 0.60–1.60)

## 2019-07-05 LAB — TSH: TSH: 4.79 u[IU]/mL — ABNORMAL HIGH (ref 0.35–4.50)

## 2019-07-05 LAB — T3, FREE: T3, Free: 3.2 pg/mL (ref 2.3–4.2)

## 2019-07-06 ENCOUNTER — Other Ambulatory Visit: Payer: Self-pay | Admitting: Internal Medicine

## 2019-07-06 DIAGNOSIS — E039 Hypothyroidism, unspecified: Secondary | ICD-10-CM

## 2019-07-06 MED ORDER — ARMOUR THYROID 60 MG PO TABS: 60.0000 mg | ORAL_TABLET | Freq: Every day | ORAL | 3 refills | Status: DC

## 2019-09-05 ENCOUNTER — Other Ambulatory Visit: Payer: 59

## 2019-09-10 ENCOUNTER — Other Ambulatory Visit: Payer: Self-pay

## 2019-09-10 ENCOUNTER — Other Ambulatory Visit (INDEPENDENT_AMBULATORY_CARE_PROVIDER_SITE_OTHER): Payer: 59

## 2019-09-10 DIAGNOSIS — E039 Hypothyroidism, unspecified: Secondary | ICD-10-CM | POA: Diagnosis not present

## 2019-09-10 LAB — T4, FREE: Free T4: 0.49 ng/dL — ABNORMAL LOW (ref 0.60–1.60)

## 2019-09-10 LAB — T3, FREE: T3, Free: 3.8 pg/mL (ref 2.3–4.2)

## 2019-09-10 LAB — TSH: TSH: 2.34 u[IU]/mL (ref 0.35–4.50)

## 2019-09-11 ENCOUNTER — Telehealth: Payer: Self-pay | Admitting: Internal Medicine

## 2019-09-11 NOTE — Telephone Encounter (Signed)
Patient called re: Patient's insurance now requires a 90 day supply of Armour Thyroid which requires a new RX per below:  MEDICATION: ARMOUR THYROID 60 MG tablet  PHARMACY:   [id number] - [place], [place] - [address] Phone:  [phone]  Fax:  [phone]      IS THIS A 90 DAY SUPPLY : Yes  IS PATIENT OUT OF MEDICATION: Yes  IF NOT; HOW MUCH IS LEFT: 0  LAST APPOINTMENT DATE: @10 /27/2020  NEXT APPOINTMENT DATE:@2 /05/2020  DO WE HAVE YOUR PERMISSION TO LEAVE A DETAILED MESSAGE: Yes  OTHER COMMENTS:    **Let patient know to contact pharmacy at the end of the day to make sure medication is ready. **  ** Please notify patient to allow 48-72 hours to process**  **Encourage patient to contact the pharmacy for refills or they can request refills through [facility]**

## 2019-09-12 MED ORDER — ARMOUR THYROID 60 MG PO TABS: 60.0000 mg | ORAL_TABLET | Freq: Every day | ORAL | 2 refills | Status: DC

## 2019-09-12 NOTE — Telephone Encounter (Signed)
90 day RX sent.

## 2019-09-19 ENCOUNTER — Other Ambulatory Visit: Payer: Self-pay

## 2019-09-19 MED ORDER — ESCITALOPRAM OXALATE 10 MG PO TABS: 20.0000 mg | ORAL_TABLET | Freq: Every day | ORAL | 1 refills | Status: DC

## 2019-09-20 ENCOUNTER — Other Ambulatory Visit: Payer: Self-pay

## 2019-09-20 MED ORDER — ESCITALOPRAM OXALATE 20 MG PO TABS: 20.0000 mg | ORAL_TABLET | Freq: Every day | ORAL | 0 refills | Status: DC

## 2019-09-24 ENCOUNTER — Other Ambulatory Visit: Payer: Self-pay

## 2019-09-26 ENCOUNTER — Ambulatory Visit: Payer: 59 | Admitting: Internal Medicine

## 2019-09-26 ENCOUNTER — Encounter: Payer: Self-pay | Admitting: Internal Medicine

## 2019-09-26 ENCOUNTER — Other Ambulatory Visit: Payer: Self-pay

## 2019-09-26 VITALS — BP 110/60 | HR 77 | Ht 65.0 in | Wt 180.0 lb

## 2019-09-26 DIAGNOSIS — E559 Vitamin D deficiency, unspecified: Secondary | ICD-10-CM | POA: Diagnosis not present

## 2019-09-26 DIAGNOSIS — E063 Autoimmune thyroiditis: Secondary | ICD-10-CM

## 2019-09-26 DIAGNOSIS — E038 Other specified hypothyroidism: Secondary | ICD-10-CM | POA: Diagnosis not present

## 2019-09-26 NOTE — Progress Notes (Signed)
Patient ID: [name], female   DOB: November 14, 1965, 54 y.o.   MRN: [id number]   This visit occurred during the SARS-CoV-2 public health emergency.  Safety protocols were in place, including screening questions prior to the visit, additional usage of staff PPE, and extensive cleaning of exam room while observing appropriate contact time as indicated for disinfecting solutions.   HPI  [name] is a 54 y.o.-year-old female, referred by her PCP, Dr. Ronita Hipps, returning for follow-up for hypothyroidism, diagnosed as Hashimoto's hypothyroidism at last visit.  Last visit 4 months ago.  Pt. has been dx with hypothyroidism in 11/2005 >> she initially started on Synthroid d.a.w. 75 mcg daily.  Her hypothyroidism was previously managed by Dr. Ronita Hipps and more recently by her PCP.  Her dose was reduced in the last year from 112 down to 75 mcg daily, dose that she continued at our 1st visit in 05/2019.  At that time, she had several symptoms possibly related to the thyroid so we changed to Armour Thyroid.  We initially started 45 mg daily in 05/2019, and we increased the dose to 60 mg daily in 06/2019.  She continues on this dose now.  Pt takes Armour: - in am - fasting - at least 1h from b'fast - no Ca, Fe, PPIs - + on Biotin 5000 mcg   I reviewed patient's TFTs: Lab Results  Component Value Date   TSH 2.34 09/10/2019   TSH 4.79 (H) 07/05/2019   TSH 1.43 05/24/2019   FREET4 0.49 (L) 09/10/2019   FREET4 0.44 (L) 07/05/2019   FREET4 0.69 05/24/2019   T3FREE 3.8 09/10/2019   T3FREE 3.2 07/05/2019   T3FREE 2.9 05/24/2019  04/02/2019: TSH 1.81 (0.4-4.0) - on 75 mcg daily 02/15/2018: TSH TSH 0.23 - on 112 mcg daily >> dose decreased to 88 mcg daily  Her antithyroid antibodies were elevated >> new diagnosis of Hashimoto's thyroiditis: Component     Latest Ref Rng & Units 05/24/2019  Thyroperoxidase Ab SerPl-aCnc     <9 IU/mL 98 (H)  Thyroglobulin Ab     < or = 1 IU/mL <1   At last visit,  she described: Fatigue, foggy brain, insomnia, weight gain (50 pounds in 5 years), joint pain, heat and cold intolerance, constipation, dry skin, hair loss, depression. Symptoms improved after we switched to Armour Thyroid.  She would like to continue with this.  Pt denies: - feeling nodules in neck - dysphagia - choking - SOB with lying down She does have occasional hoarseness.  She has + FH of thyroid disorders in: M aunt and uncle. No FH of thyroid cancer. No h/o radiation tx to head or neck.  No recent steroids use. No herbal supplements. In the past, she was on thyroid Energy- Ashw[place]anda, Iodine, L-thyroine - stopped 1 to 2 weeks before our last visit due to GERD.  Pt. also has a history of anxiety and depression -on clonazepam and Lexapro.  At last visit she was complaining of joint pains and a vitamin D level was found to be slightly low: Lab Results  Component Value Date   VD25OH 26.80 (L) 05/24/2019   We started 2000 units vitamin D daily.  ROS: Constitutional: no weight gain/no weight loss, + fatigue, + subjective hyperthermia, + subjective hypothermia Eyes: no blurry vision, + xerophthalmia ENT: no sore throat, + see HPI, + tinnitus, + hypoacusis Cardiovascular: no CP/no SOB/no palpitations/no leg swelling Respiratory: no cough/no SOB/no wheezing Gastrointestinal: no N/no V/no D/+ C/+ acid reflux Musculoskeletal: +  Muscle aches/+ joint aches Skin: no rashes, + hair loss Neurological: no tremors/no numbness/no tingling/no dizziness, + headaches  I reviewed pt's medications, allergies, PMH, social hx, family hx, and changes were documented in the history of present illness. Otherwise, unchanged from my initial visit note.  Past Medical History:  Diagnosis Date  . Peripheral vascular disease ([place]) 1992   blood clot with broken leg, coumadin x 6 mos   Past Surgical History:  Procedure Laterality Date  . TONSILLECTOMY     as a child   Social History    Socioeconomic History  . Marital status: Married    Spouse name: Not on file  . Number of children: 55 -75 years old in 05/2019 she also takes care of her 62-year-old niece  . Years of education: Not on file  . Highest education level: Not on file  Occupational History  .  Hairstylist  Social Needs  . Financial resource strain: Not on file  . Food insecurity    Worry: Not on file    Inability: Not on file  . Transportation needs    Medical: Not on file    Non-medical: Not on file  Tobacco Use  . Smoking status: Never Smoker  . Smokeless tobacco: Never Used  Substance and Sexual Activity  . Alcohol use: Yes    Alcohol/week:     Types:  3 drinks per week-wine or beer  . Drug use: No  . Sexual activity: Yes    Birth control/protection: Surgical    Comment: husband had vasectomy   Current Outpatient Medications on File Prior to Visit  Medication Sig Dispense Refill  . ARMOUR THYROID 60 MG tablet Take 1 tablet (60 mg total) by mouth daily before breakfast. 90 tablet 2  . clonazePAM (KLONOPIN) 0.5 MG tablet Take 0.5 mg by mouth daily as needed.    Marland Kitchen escitalopram (LEXAPRO) 20 MG tablet Take 1 tablet (20 mg total) by mouth daily. 90 tablet 0  . ibuprofen (ADVIL,MOTRIN) 200 MG tablet Take 400 mg by mouth daily as needed. For headache     . PREMARIN vaginal cream APPLY 1/2 GRAM VIA APPLICATOR EVERY NIGHT FOR 3 WEEKS THEN TWICE WEEKLY    . SUMAtriptan (IMITREX) 100 MG tablet      No current facility-administered medications on file prior to visit.   No Known Allergies   Family history:  Diabetes in mother and grandmother HTN and HL in mother and maternal aunt Heart disease in mother, maternal grandfather Breast and skin cancer in mother Also, biological father died of cancer  PE: BP 110/60   Pulse 77   Ht 5\' 5"  (1.651 m)   Wt 180 lb (81.6 kg)   SpO2 96%   BMI 29.95 kg/m  Wt Readings from Last 3 Encounters:  09/26/19 180 lb (81.6 kg)  05/24/19 176 lb (79.8 kg)   04/08/11 165 lb (74.8 kg)   Constitutional: overweight, in NAD Eyes: PERRLA, EOMI, no exophthalmos ENT: moist mucous membranes, no thyromegaly, no cervical lymphadenopathy Cardiovascular: RRR, No MRG Respiratory: CTA B Gastrointestinal: abdomen soft, NT, ND, BS+ Musculoskeletal: no deformities, strength intact in all 4 Skin: moist, warm, no rashes Neurological: no tremor with outstretched hands, DTR normal in all 4  ASSESSMENT: 1. Hypothyroidism -Diagnosed as Hashimoto's hypothyroidism at last visit  PLAN:  1. Patient with longstanding hypothyroidism, previously on levothyroxine therapy, with normal TFTs but many nonspecific symptoms which could have been related to the thyroid but also to menopause, depression, insomnia, deconditioning.  However,  at last visit,  we discussed about trying a desiccated thyroid extract.  We started Armour thyroid at 45 mg daily and we increased the dose in 06/2019 to 60 mg daily.  Subsequent TFTs were at goal on 09/10/2019: Lab Results  Component Value Date   TSH 2.34 09/10/2019   - she continues on Armour 60 mg daily - pt feels better on this formulation and would like to continue - we discussed about taking the thyroid hormone every day, with water, >30 minutes before breakfast, separated by >4 hours from acid reflux medications, calcium, iron, multivitamins. Pt. is taking it correctly.  She did biotin and she is taking a relatively high dose, 5000 mcg daily.  I advised her to stop this at least a week before the next set of labs. - will check thyroid tests in 1 more month: TSH, free T3 and fT4 - At this visit, we again discussed about her new diagnosis of Hashimoto's thyroiditis.  I explained what this means and we also decided to start selenium 200 mcg daily to hopefully help reduce her antibody levels. - I will see her back in 6 months  2. vitamin D insufficiency -At last visit she was having generalized joint pains and we checked a vitamin D level  which returned slightly low. -I advised her to start 2000 units vitamin D daily -We will check a vitamin D level at next lab draw in 1 month  Orders Placed This Encounter  Procedures  . TSH  . T4, free  . T3, free  . VITAMIN D 25 Hydroxy (Vit-D Deficiency, Fractures)   Philemon Kingdom, MD PhD [facility] Endocrinology

## 2019-09-26 NOTE — Patient Instructions (Addendum)
Please come back for labs in a month.  Continue Armour thyroid 60 mg daily.  Take the thyroid hormone every day, with water, at least 30 minutes before breakfast, separated by at least 4 hours from: - acid reflux medications - calcium - iron - multivitamins  Stop Biotin at least 7 days before thyroid labs.  Consider starting Selenium 200 mcg daily.  Please come back for a follow-up appointment in 6 months.

## 2019-10-25 ENCOUNTER — Encounter: Payer: Self-pay | Admitting: Internal Medicine

## 2019-10-25 ENCOUNTER — Other Ambulatory Visit (INDEPENDENT_AMBULATORY_CARE_PROVIDER_SITE_OTHER): Payer: 59

## 2019-10-25 ENCOUNTER — Other Ambulatory Visit: Payer: Self-pay

## 2019-10-25 DIAGNOSIS — E063 Autoimmune thyroiditis: Secondary | ICD-10-CM

## 2019-10-25 DIAGNOSIS — E559 Vitamin D deficiency, unspecified: Secondary | ICD-10-CM

## 2019-10-25 DIAGNOSIS — E038 Other specified hypothyroidism: Secondary | ICD-10-CM

## 2019-10-25 LAB — TSH: TSH: 1.69 u[IU]/mL (ref 0.35–4.50)

## 2019-10-25 LAB — VITAMIN D 25 HYDROXY (VIT D DEFICIENCY, FRACTURES): VITD: 26.01 ng/mL — ABNORMAL LOW (ref 30.00–100.00)

## 2019-10-25 LAB — T4, FREE: Free T4: 0.48 ng/dL — ABNORMAL LOW (ref 0.60–1.60)

## 2019-10-25 LAB — T3, FREE: T3, Free: 2.8 pg/mL (ref 2.3–4.2)

## 2019-11-19 ENCOUNTER — Other Ambulatory Visit: Payer: Self-pay

## 2019-11-19 MED ORDER — SUMATRIPTAN SUCCINATE 100 MG PO TABS: 100.0000 mg | ORAL_TABLET | ORAL | 0 refills | Status: DC | PRN

## 2019-11-29 ENCOUNTER — Encounter: Payer: Self-pay | Admitting: Physician Assistant

## 2019-11-29 ENCOUNTER — Ambulatory Visit: Payer: 59 | Admitting: Physician Assistant

## 2019-11-29 ENCOUNTER — Other Ambulatory Visit: Payer: Self-pay

## 2019-11-29 VITALS — BP 122/70 | HR 82 | Temp 98.1°F | Ht 64.0 in | Wt 181.8 lb

## 2019-11-29 DIAGNOSIS — M792 Neuralgia and neuritis, unspecified: Secondary | ICD-10-CM | POA: Diagnosis not present

## 2019-11-29 MED ORDER — CLONAZEPAM 0.5 MG PO TABS: 0.5000 mg | ORAL_TABLET | Freq: Every day | ORAL | 1 refills | Status: DC

## 2019-11-29 MED ORDER — GABAPENTIN 100 MG PO CAPS: 100.0000 mg | ORAL_CAPSULE | Freq: Every day | ORAL | 1 refills | Status: DC

## 2019-11-29 NOTE — Progress Notes (Signed)
Acute Office Visit  Subjective:    Patient ID: [name], female    DOB: Dec 20, 1965, 54 y.o.   MRN: [id number]  Chief Complaint  Patient presents with  . Rash    Head/neck itching x3 months    HPI Patient is in today for itching  Pt states that for the past 3 months she has had itching from her neck up - her whole neck, face, ears, eyelids, forehead, scalp etc --- says it at times feels like bees stinging her Denies rash - denies any new products used Is actually taking allegra 180 qam and loratidine 10mg  qhs with no relief Recently saw endocrinologist and thyroid labwork stable  Past Medical History:  Diagnosis Date  . Atrophy of thyroid (acquired)   . Autoimmune thyroiditis   . Chronic fatigue, unspecified   . Gastro-esophageal reflux disease without esophagitis   . Generalized anxiety disorder   . Menopausal and female climacteric states   . Migraine without aura, not intractable, without status migrainosus   . Peripheral vascular disease ([place]) 1992   blood clot with broken leg, coumadin x 6 mos  . Vitamin D deficiency, unspecified     Past Surgical History:  Procedure Laterality Date  . Nova sure ablation  2012  . rotator cuff surgery  12/2014  . TONSILLECTOMY     as a child    Family History  Problem Relation Age of Onset  . Hypertension Mother   . Hyperlipidemia Mother   . Breast cancer Mother   . Melanoma Mother   . Migraines Mother   . Diabetes type II Mother   . Lung cancer Father   . Hypertension Sister   . Heart attack Maternal Grandmother   . Heart attack Maternal Grandfather   . Stroke Maternal Grandfather     Social History   Socioeconomic History  . Marital status: Married    Spouse name: Not on file  . Number of children: 2  . Years of education: Not on file  . Highest education level: Not on file  Occupational History  . Occupation: Hair stylist  Tobacco Use  . Smoking status: Never Smoker  . Smokeless tobacco: Never Used    Substance and Sexual Activity  . Alcohol use: Yes    Alcohol/week: 1.0 standard drinks    Types: 1 Glasses of wine per week    Comment: Drinks on a regular basis and average quanity when she drinks is usually 1-2 drinks.  . Drug use: No  . Sexual activity: Yes    Birth control/protection: Surgical    Comment: husband had vasectomy  Other Topics Concern  . Not on file  Social History Narrative  . Not on file   Social Determinants of Health   Financial Resource Strain:   . Difficulty of Paying Living Expenses:   Food Insecurity:   . Worried About Charity fundraiser in the Last Year:   . Arboriculturist in the Last Year:   Transportation Needs:   . Film/video editor (Medical):   Marland Kitchen Lack of Transportation (Non-Medical):   Physical Activity:   . Days of Exercise per Week:   . Minutes of Exercise per Session:   Stress:   . Feeling of Stress :   Social Connections:   . Frequency of Communication with Friends and Family:   . Frequency of Social Gatherings with Friends and Family:   . Attends Religious Services:   . Active Member of Clubs or  Organizations:   . Attends Archivist Meetings:   Marland Kitchen Marital Status:   Intimate Partner Violence:   . Fear of Current or Ex-Partner:   . Emotionally Abused:   Marland Kitchen Physically Abused:   . Sexually Abused:      Current Outpatient Medications:  .  ARMOUR THYROID 60 MG tablet, Take 1 tablet (60 mg total) by mouth daily before breakfast., Disp: 90 tablet, Rfl: 2 .  Carboxymethylcellulose Sodium (LUBRICANT EYE DROPS OP), Apply to eye., Disp: , Rfl:  .  Cholecalciferol (VITAMIN D-3) 125 MCG (5000 UT) TABS, Take 1 tablet by mouth daily., Disp: , Rfl:  .  clonazePAM (KLONOPIN) 0.5 MG tablet, Take 1 tablet (0.5 mg total) by mouth at bedtime., Disp: 30 tablet, Rfl: 1 .  escitalopram (LEXAPRO) 20 MG tablet, Take 1 tablet (20 mg total) by mouth daily., Disp: 90 tablet, Rfl: 0 .  fexofenadine (ALLEGRA) 180 MG tablet, Take 180 mg by mouth  daily., Disp: , Rfl:  .  ibuprofen (ADVIL,MOTRIN) 200 MG tablet, Take 400 mg by mouth daily as needed. For headache , Disp: , Rfl:  .  loratadine (CLARITIN) 10 MG tablet, Take 10 mg by mouth daily., Disp: , Rfl:  .  Multiple Vitamin (MULTIVITAMIN) tablet, Take 1 tablet by mouth daily., Disp: , Rfl:  .  polyethylene glycol (MIRALAX / GLYCOLAX) 17 g packet, Take 17 g by mouth daily., Disp: , Rfl:  .  PREMARIN vaginal cream, APPLY 1/2 GRAM VIA APPLICATOR EVERY NIGHT FOR 3 WEEKS THEN TWICE WEEKLY, Disp: , Rfl:  .  SUMAtriptan (IMITREX) 100 MG tablet, Take 1 tablet (100 mg total) by mouth every 2 (two) hours as needed for migraine. One tablet one daily at onset of migraine, repeat in 2 hours prn continued migraine. No more than 2 in 24 hours., Disp: 9 tablet, Rfl: 0 .  triamcinolone cream (KENALOG) 0.1 %, Apply 1 application topically 2 (two) times daily., Disp: , Rfl:  .  gabapentin (NEURONTIN) 100 MG capsule, Take 1 capsule (100 mg total) by mouth at bedtime., Disp: 30 capsule, Rfl: 1   No Known Allergies  CONSTITUTIONAL: Negative for chills, fatigue, fever, unintentional weight gain and unintentional weight loss.  E/N/T: Negative for ear pain, nasal congestion and sore throat.  CARDIOVASCULAR: Negative for chest pain, dizziness, palpitations and pedal edema.  RESPIRATORY: Negative for recent cough and dyspnea.  GASTROINTESTINAL: Negative for abdominal pain, acid reflux symptoms, constipation, diarrhea, nausea and vomiting.  MSK: Negative for arthralgias and myalgias.  INTEGUMENTARY: see HPI NEUROLOGICAL: Negative for dizziness and headaches.  PSYCHIATRIC: Negative for sleep disturbance and to question depression screen.  Negative for depression, negative for anhedonia.         Objective:    PHYSICAL EXAM:   VS: BP 122/70 (BP Location: Left Arm, Patient Position: Sitting)   Pulse 82   Temp 98.1 F (36.7 C) (Temporal)   Ht 5\' 4"  (1.626 m)   Wt 181 lb 12.8 oz (82.5 kg)   SpO2 96%   BMI  31.21 kg/m   GEN: Well nourished, well developed, in no acute distress   Neck: no JVD or masses - no thyromegaly Cardiac: RRR; no murmurs, rubs, or gallops,no edema - no significant varicosities Respiratory:  normal respiratory rate and pattern with no distress - normal breath sounds with no rales, rhonchi, wheezes or rubs GI: normal bowel sounds, no masses or tenderness MS: no deformity or atrophy  Skin: no skin lesions or rashes noted Neuro:  Alert and Oriented  x 3, Strength and sensation are intact - CN II-Xii grossly intact Psych: euthymic mood, appropriate affect and demeanor   Wt Readings from Last 3 Encounters:  11/29/19 181 lb 12.8 oz (82.5 kg)  09/26/19 180 lb (81.6 kg)  05/24/19 176 lb (79.8 kg)    Health Maintenance Due  Topic Date Due  . HIV Screening  Never done  . TETANUS/TDAP  Never done    There are no preventive care reminders to display for this patient.        Assessment & Plan:   Problem List Items Addressed This Visit    None    Visit Diagnoses    Neuralgia    -  Primary   Relevant Medications   gabapentin (NEURONTIN) 100 MG capsule   Other Relevant Orders   CBC with Differential/Platelet   Comprehensive metabolic panel       Meds ordered this encounter  Medications  . gabapentin (NEURONTIN) 100 MG capsule    Sig: Take 1 capsule (100 mg total) by mouth at bedtime.    Dispense:  30 capsule    Refill:  1    Order Specific Question:   Supervising Provider    AnswerRochel Brome [id number]  . clonazePAM (KLONOPIN) 0.5 MG tablet    Sig: Take 1 tablet (0.5 mg total) by mouth at bedtime.    Dispense:  30 tablet    Refill:  1    Order Specific Question:   Supervising Provider    Answer:   COX, Lynder Parents     [place], PA-C

## 2019-11-30 LAB — COMPREHENSIVE METABOLIC PANEL
ALT: 13 IU/L (ref 0–32)
AST: 19 IU/L (ref 0–40)
Albumin/Globulin Ratio: 1.9 (ref 1.2–2.2)
Albumin: 4.7 g/dL (ref 3.8–4.9)
Alkaline Phosphatase: 81 IU/L (ref 39–117)
BUN/Creatinine Ratio: 21 (ref 9–23)
BUN: 12 mg/dL (ref 6–24)
Bilirubin Total: 0.2 mg/dL (ref 0.0–1.2)
CO2: 26 mmol/L (ref 20–29)
Calcium: 9.8 mg/dL (ref 8.7–10.2)
Chloride: 103 mmol/L (ref 96–106)
Creatinine, Ser: 0.58 mg/dL (ref 0.57–1.00)
GFR calc Af Amer: 122 mL/min/{1.73_m2} (ref >59)
GFR calc non Af Amer: 106 mL/min/{1.73_m2} (ref >59)
Globulin, Total: 2.5 g/dL (ref 1.5–4.5)
Glucose: 84 mg/dL (ref 65–99)
Potassium: 4.1 mmol/L (ref 3.5–5.2)
Sodium: 142 mmol/L (ref 134–144)
Total Protein: 7.2 g/dL (ref 6.0–8.5)

## 2019-11-30 LAB — CBC WITH DIFFERENTIAL/PLATELET
Basophils Absolute: 0.1 10*3/uL (ref 0.0–0.2)
Basos: 1 %
EOS (ABSOLUTE): 0.3 10*3/uL (ref 0.0–0.4)
Eos: 3 %
Hematocrit: 37.5 % (ref 34.0–46.6)
Hemoglobin: 12.8 g/dL (ref 11.1–15.9)
Immature Grans (Abs): 0.1 10*3/uL (ref 0.0–0.1)
Immature Granulocytes: 1 %
Lymphocytes Absolute: 2.9 10*3/uL (ref 0.7–3.1)
Lymphs: 34 %
MCH: 30.8 pg (ref 26.6–33.0)
MCHC: 34.1 g/dL (ref 31.5–35.7)
MCV: 90 fL (ref 79–97)
Monocytes Absolute: 0.6 10*3/uL (ref 0.1–0.9)
Monocytes: 7 %
Neutrophils Absolute: 4.6 10*3/uL (ref 1.4–7.0)
Neutrophils: 54 %
Platelets: 324 10*3/uL (ref 150–450)
RBC: 4.16 x10E6/uL (ref 3.77–5.28)
RDW: 12.7 % (ref 11.7–15.4)
WBC: 8.4 10*3/uL (ref 3.4–10.8)

## 2019-12-12 ENCOUNTER — Telehealth: Payer: Self-pay

## 2019-12-12 ENCOUNTER — Other Ambulatory Visit: Payer: Self-pay | Admitting: Physician Assistant

## 2019-12-12 MED ORDER — GABAPENTIN 100 MG PO CAPS: 100.0000 mg | ORAL_CAPSULE | Freq: Two times a day (BID) | ORAL | 0 refills | Status: DC

## 2019-12-12 NOTE — Telephone Encounter (Signed)
Pt called states [name] started her on gabapentin 100 mg one po qhs 2 weeks ago for pain and numbness in lower feet and legs    Med has helped some but still having burning [name] states [name] told her [name] would increase dose    Send rx to [facility]  [name] will call back in 2 weeks to let us know how increased dose is helping   [name] will make return appt IF NEEDED

## 2019-12-12 NOTE — Telephone Encounter (Signed)
Have pt increase to gabapentin 100mg  bid

## 2019-12-15 ENCOUNTER — Other Ambulatory Visit: Payer: Self-pay | Admitting: Family Medicine

## 2020-01-08 ENCOUNTER — Encounter: Payer: Self-pay | Admitting: Physician Assistant

## 2020-01-08 ENCOUNTER — Other Ambulatory Visit: Payer: Self-pay

## 2020-01-08 ENCOUNTER — Ambulatory Visit: Payer: 59 | Admitting: Physician Assistant

## 2020-01-08 VITALS — BP 122/72 | HR 72 | Temp 97.9°F | Ht 64.0 in | Wt 181.0 lb

## 2020-01-08 DIAGNOSIS — L309 Dermatitis, unspecified: Secondary | ICD-10-CM | POA: Diagnosis not present

## 2020-01-08 MED ORDER — DOXYCYCLINE HYCLATE 100 MG PO TABS
100.0000 mg | ORAL_TABLET | Freq: Two times a day (BID) | ORAL | 0 refills | Status: DC
Start: 2020-01-08 — End: 2020-03-31

## 2020-01-08 MED ORDER — CLOBETASOL PROPIONATE 0.05 % EX SOLN: 1.0000 "application " | Freq: Two times a day (BID) | CUTANEOUS | 0 refills | Status: DC

## 2020-01-08 NOTE — Progress Notes (Signed)
Acute Office Visit  Subjective:    Patient ID: [name], female    DOB: [birth year], 54 y.o.   MRN: [id number]  Chief Complaint  Patient presents with  . Rash    on back of neck in hairline    HPI Patient is in today for rash Pt states that over the past week she has had itching in her scalp and then broke out in rash on front of hairline - states it started after sweating a lot after working outside Says rash itches and burns  Past Medical History:  Diagnosis Date  . Atrophy of thyroid (acquired)   . Autoimmune thyroiditis   . Chronic fatigue, unspecified   . Gastro-esophageal reflux disease without esophagitis   . Generalized anxiety disorder   . Menopausal and female climacteric states   . Migraine without aura, not intractable, without status migrainosus   . Peripheral vascular disease ([place]) 1992   blood clot with broken leg, coumadin x 6 mos  . Vitamin D deficiency, unspecified     Past Surgical History:  Procedure Laterality Date  . Nova sure ablation  2012  . rotator cuff surgery  12/2014  . TONSILLECTOMY     as a child    Family History  Problem Relation Age of Onset  . Hypertension Mother   . Hyperlipidemia Mother   . Breast cancer Mother   . Melanoma Mother   . Migraines Mother   . Diabetes type II Mother   . Lung cancer Father   . Hypertension Sister   . Heart attack Maternal Grandmother   . Heart attack Maternal Grandfather   . Stroke Maternal Grandfather     Social History   Socioeconomic History  . Marital status: Married    Spouse name: Not on file  . Number of children: 2  . Years of education: Not on file  . Highest education level: Not on file  Occupational History  . Occupation: Hair stylist  Tobacco Use  . Smoking status: Never Smoker  . Smokeless tobacco: Never Used  Substance and Sexual Activity  . Alcohol use: Yes    Alcohol/week: 1.0 standard drinks    Types: 1 Glasses of wine per week    Comment: Drinks on a  regular basis and average quanity when she drinks is usually 1-2 drinks.  . Drug use: No  . Sexual activity: Yes    Birth control/protection: Surgical    Comment: husband had vasectomy  Other Topics Concern  . Not on file  Social History Narrative  . Not on file   Social Determinants of Health   Financial Resource Strain:   . Difficulty of Paying Living Expenses:   Food Insecurity:   . Worried About Charity fundraiser in the Last Year:   . Arboriculturist in the Last Year:   Transportation Needs:   . Film/video editor (Medical):   Marland Kitchen Lack of Transportation (Non-Medical):   Physical Activity:   . Days of Exercise per Week:   . Minutes of Exercise per Session:   Stress:   . Feeling of Stress :   Social Connections:   . Frequency of Communication with Friends and Family:   . Frequency of Social Gatherings with Friends and Family:   . Attends Religious Services:   . Active Member of Clubs or Organizations:   . Attends Archivist Meetings:   Marland Kitchen Marital Status:   Intimate Partner Violence:   . Fear of  Current or Ex-Partner:   . Emotionally Abused:   Marland Kitchen Physically Abused:   . Sexually Abused:      Current Outpatient Medications:  .  ARMOUR THYROID 60 MG tablet, Take 1 tablet (60 mg total) by mouth daily before breakfast., Disp: 90 tablet, Rfl: 2 .  Carboxymethylcellulose Sodium (LUBRICANT EYE DROPS OP), Apply to eye., Disp: , Rfl:  .  Cholecalciferol (VITAMIN D-3) 125 MCG (5000 UT) TABS, Take 1 tablet by mouth daily., Disp: , Rfl:  .  clonazePAM (KLONOPIN) 0.5 MG tablet, Take 1 tablet (0.5 mg total) by mouth at bedtime., Disp: 30 tablet, Rfl: 1 .  escitalopram (LEXAPRO) 20 MG tablet, Take 1 tablet (20 mg total) by mouth daily., Disp: 90 tablet, Rfl: 0 .  fexofenadine (ALLEGRA) 180 MG tablet, Take 180 mg by mouth daily., Disp: , Rfl:  .  gabapentin (NEURONTIN) 100 MG capsule, Take 1 capsule (100 mg total) by mouth 2 (two) times daily., Disp: 60 capsule, Rfl: 0 .   ibuprofen (ADVIL,MOTRIN) 200 MG tablet, Take 400 mg by mouth daily as needed. For headache , Disp: , Rfl:  .  loratadine (CLARITIN) 10 MG tablet, Take 10 mg by mouth daily., Disp: , Rfl:  .  Multiple Vitamin (MULTIVITAMIN) tablet, Take 1 tablet by mouth daily., Disp: , Rfl:  .  polyethylene glycol (MIRALAX / GLYCOLAX) 17 g packet, Take 17 g by mouth daily., Disp: , Rfl:  .  PREMARIN vaginal cream, APPLY 1/2 GRAM VIA APPLICATOR EVERY NIGHT FOR 3 WEEKS THEN TWICE WEEKLY, Disp: , Rfl:  .  SUMAtriptan (IMITREX) 100 MG tablet, TAKE ONE TABLET ONE DAILY AT ONSET OF MIGRAINE, REPEAT IN 2 HOURS AS NEEDED NO MORE THAN 2 IN 24 HOURS., Disp: 9 tablet, Rfl: 0 .  triamcinolone cream (KENALOG) 0.1 %, Apply 1 application topically 2 (two) times daily., Disp: , Rfl:  .  clobetasol (TEMOVATE) 0.05 % external solution, Apply 1 application topically 2 (two) times daily., Disp: 50 mL, Rfl: 0 .  doxycycline (VIBRA-TABS) 100 MG tablet, Take 1 tablet (100 mg total) by mouth 2 (two) times daily., Disp: 20 tablet, Rfl: 0   No Known Allergies  CONSTITUTIONAL: Negative for chills, fatigue, fever, unintentional weight gain and unintentional weight loss.   CARDIOVASCULAR: Negative for chest pain, dizziness, palpitations and pedal edema.  RESPIRATORY: Negative for recent cough and dyspnea.   INTEGUMENTARY: see HPI         Objective:    PHYSICAL EXAM:   VS: BP 122/72 (BP Location: Left Arm, Patient Position: Sitting)   Pulse 72   Temp 97.9 F (36.6 C) (Temporal)   Ht 5\' 4"  (1.626 m)   Wt 181 lb (82.1 kg)   BMI 31.07 kg/m   GEN: Well nourished, well developed, in no acute distress   Cardiac: RRR; no murmurs, rubs, or gallops,no edema - no significant varicosities Respiratory:  normal respiratory rate and pattern with no distress - normal breath sounds with no rales, rhonchi, wheezes or rubs  Skin: has white pustules along top of head at hairline - also has some small erythematous macular patches at nape of  neck    Wt Readings from Last 3 Encounters:  01/08/20 181 lb (82.1 kg)  11/29/19 181 lb 12.8 oz (82.5 kg)  09/26/19 180 lb (81.6 kg)    Health Maintenance Due  Topic Date Due  . HIV Screening  Never done  . TETANUS/TDAP  Never done    There are no preventive care reminders to display for  this patient.        Assessment & Plan:   Problem List Items Addressed This Visit      Musculoskeletal and Integument   Dermatitis - Primary    rx for doxycycline and temovate scalp Follow up if symptoms persist or worsen          Meds ordered this encounter  Medications  . doxycycline (VIBRA-TABS) 100 MG tablet    Sig: Take 1 tablet (100 mg total) by mouth 2 (two) times daily.    Dispense:  20 tablet    Refill:  0    Order Specific Question:   Supervising Provider    AnswerRochel Brome [id number]  . clobetasol (TEMOVATE) 0.05 % external solution    Sig: Apply 1 application topically 2 (two) times daily.    Dispense:  50 mL    Refill:  0    Order Specific Question:   Supervising Provider    Answer:   COX, KIRSTEN [id number]     [place], PA-C

## 2020-01-08 NOTE — Assessment & Plan Note (Signed)
rx for doxycycline and temovate scalp Follow up if symptoms persist or worsen

## 2020-01-13 ENCOUNTER — Other Ambulatory Visit: Payer: Self-pay | Admitting: Physician Assistant

## 2020-01-21 ENCOUNTER — Other Ambulatory Visit: Payer: Self-pay

## 2020-01-21 MED ORDER — CLONAZEPAM 0.5 MG PO TABS: 0.5000 mg | ORAL_TABLET | Freq: Every day | ORAL | 1 refills | Status: DC

## 2020-01-22 ENCOUNTER — Other Ambulatory Visit: Payer: Self-pay

## 2020-01-22 MED ORDER — ESCITALOPRAM OXALATE 20 MG PO TABS: 20.0000 mg | ORAL_TABLET | Freq: Every day | ORAL | 0 refills | Status: DC

## 2020-02-06 ENCOUNTER — Other Ambulatory Visit: Payer: Self-pay | Admitting: Physician Assistant

## 2020-02-12 ENCOUNTER — Other Ambulatory Visit: Payer: Self-pay | Admitting: Physician Assistant

## 2020-03-10 ENCOUNTER — Other Ambulatory Visit: Payer: Self-pay | Admitting: Physician Assistant

## 2020-03-31 ENCOUNTER — Ambulatory Visit (INDEPENDENT_AMBULATORY_CARE_PROVIDER_SITE_OTHER): Payer: 59 | Admitting: Internal Medicine

## 2020-03-31 ENCOUNTER — Other Ambulatory Visit: Payer: Self-pay

## 2020-03-31 ENCOUNTER — Encounter: Payer: Self-pay | Admitting: Internal Medicine

## 2020-03-31 VITALS — BP 122/70 | HR 71 | Ht 64.0 in | Wt 183.0 lb

## 2020-03-31 DIAGNOSIS — E559 Vitamin D deficiency, unspecified: Secondary | ICD-10-CM | POA: Diagnosis not present

## 2020-03-31 DIAGNOSIS — E063 Autoimmune thyroiditis: Secondary | ICD-10-CM

## 2020-03-31 DIAGNOSIS — E038 Other specified hypothyroidism: Secondary | ICD-10-CM | POA: Diagnosis not present

## 2020-03-31 LAB — T4, FREE: Free T4: 0.4 ng/dL — ABNORMAL LOW (ref 0.60–1.60)

## 2020-03-31 LAB — TSH: TSH: 2.12 u[IU]/mL (ref 0.35–4.50)

## 2020-03-31 LAB — T3, FREE: T3, Free: 3 pg/mL (ref 2.3–4.2)

## 2020-03-31 NOTE — Patient Instructions (Addendum)
Please stop at the lab.  Continue Armour thyroid 60 mg daily.  Take the thyroid hormone every day, with water, at least 30 minutes before breakfast, separated by at least 4 hours from: - acid reflux medications - calcium - iron - multivitamins  Continue Selenium 200 mcg daily.  Please come back for a follow-up appointment in 1 year.

## 2020-03-31 NOTE — Progress Notes (Signed)
Patient ID: [name], female   DOB: June 29, 1966, 54 y.o.   MRN: [id number]   This visit occurred during the SARS-CoV-2 public health emergency.  Safety protocols were in place, including screening questions prior to the visit, additional usage of staff PPE, and extensive cleaning of exam room while observing appropriate contact time as indicated for disinfecting solutions.   HPI  [name] is a 54 y.o.-year-old female, referred by her PCP, Dr. Ronita Hipps, returning for follow-up for hypothyroidism, diagnosed as Hashimoto's hypothyroidism at last visit.  Last visit 6 months ago.  Patient was diagnosed with hypothyroidism in 11/2005 >> she initially started on Synthroid d.a.w. 75 mcg daily.  Her hypothyroidism was previously managed by Dr. Ronita Hipps and more recently by her PCP.  Her dose was reduced in the last year from 112 down to 75 mcg daily, dose that she continued at our 1st visit in 05/2019.  At that time, she had several symptoms possibly related to the thyroid so we changed to Armour Thyroid. We initially started 45 mg daily in 05/2019, and we increase the dose to 60 mg daily in 06/2019.  She continues on this dose now.  Pt is on Armour 60 mg daily: - in am - fasting - at least 1-3 hours from b'fast - no Ca, Fe, MVI, PPIs - stopped Biotin 5000 mcg daily since last visit  Reviewed patient's TFTs: Lab Results  Component Value Date   TSH 1.69 10/25/2019   TSH 2.34 09/10/2019   TSH 4.79 (H) 07/05/2019   TSH 1.43 05/24/2019   FREET4 0.48 (L) 10/25/2019   FREET4 0.49 (L) 09/10/2019   FREET4 0.44 (L) 07/05/2019   FREET4 0.69 05/24/2019   T3FREE 2.8 10/25/2019   T3FREE 3.8 09/10/2019   T3FREE 3.2 07/05/2019   T3FREE 2.9 05/24/2019  04/02/2019: TSH 1.81 (0.4-4.0) - on 75 mcg daily 02/15/2018: TSH TSH 0.23 - on 112 mcg daily >> dose decreased to 88 mcg daily  Her antithyroid antibodies were elevated pointing towards a diagnosis of Hashimoto's thyroiditis: Component     Latest Ref  Rng & Units 05/24/2019  Thyroperoxidase Ab SerPl-aCnc     <9 IU/mL 98 (H)  Thyroglobulin Ab     < or = 1 IU/mL <1   We started selenium 200 mcg daily in 09/2019.  At last visit, she described: Fatigue, foggy brain, insomnia, weight gain (50 pounds in 5 years), joint pain, heat and cold intolerance, constipation, dry skin, hair loss, depression. Her symptoms improved after we switch to Armour Thyroid.  She would like to continue with this.  Pt denies: - feeling nodules in neck - dysphagia - choking - SOB with lying down + Occasional hoarseness in am and towards the end of the day  She has + FH of thyroid disorders in: M aunt and uncle. No FH of thyroid cancer. No h/o radiation tx to head or neck.  No herbal supplements. No recent steroids use. . In the past, she was on thyroid Energy- Ashwaganda, Iodine, L-thyroine -but stopped due to GERD  Pt. also has a history of anxiety and depression -on clonazepam and Lexapro.  She has a history of vitamin D insufficiency, discovered in the setting of generalized joint pains.  At last visit, vitamin D level was still slightly low: Lab Results  Component Value Date   VD25OH 26.01 (L) 10/25/2019   VD25OH 26.80 (L) 05/24/2019   We started 2000 units vitamin D daily.  We increased the dose to 4000 units daily  at last visit. At this visit, she is on 5000 iu daily.  ROS: Constitutional: no weight gain/no weight loss, + fatigue, + subjective hyperthermia, + subjective hypothermia Eyes: no blurry vision, + xerophthalmia ENT: no sore throat, + see HPI Cardiovascular: no CP/no SOB/no palpitations/no leg swelling Respiratory: no cough/no SOB/no wheezing Gastrointestinal: no N/no V/no D/no C/+ acid reflux Musculoskeletal: + Both: Muscle aches/ joint aches Skin: no rashes, + hair loss Neurological: no tremors/no numbness/no tingling/no dizziness  I reviewed pt's medications, allergies, PMH, social hx, family hx, and changes were documented in the  history of present illness. Otherwise, unchanged from my initial visit note.  Past Medical History:  Diagnosis Date  . Atrophy of thyroid (acquired)   . Autoimmune thyroiditis   . Chronic fatigue, unspecified   . Gastro-esophageal reflux disease without esophagitis   . Generalized anxiety disorder   . Menopausal and female climacteric states   . Migraine without aura, not intractable, without status migrainosus   . Peripheral vascular disease ([place]) 1992   blood clot with broken leg, coumadin x 6 mos  . Vitamin D deficiency, unspecified    Past Surgical History:  Procedure Laterality Date  . Nova sure ablation  2012  . rotator cuff surgery  12/2014  . TONSILLECTOMY     as a child   Social History   Socioeconomic History  . Marital status: Married    Spouse name: Not on file  . Number of children: 56 -22 years old in 05/2019 she also takes care of her 88-year-old niece  . Years of education: Not on file  . Highest education level: Not on file  Occupational History  .  Hairstylist  Social Needs  . Financial resource strain: Not on file  . Food insecurity    Worry: Not on file    Inability: Not on file  . Transportation needs    Medical: Not on file    Non-medical: Not on file  Tobacco Use  . Smoking status: Never Smoker  . Smokeless tobacco: Never Used  Substance and Sexual Activity  . Alcohol use: Yes    Alcohol/week:     Types:  3 drinks per week-wine or beer  . Drug use: No  . Sexual activity: Yes    Birth control/protection: Surgical    Comment: husband had vasectomy   Current Outpatient Medications on File Prior to Visit  Medication Sig Dispense Refill  . ARMOUR THYROID 60 MG tablet Take 1 tablet (60 mg total) by mouth daily before breakfast. 90 tablet 2  . Carboxymethylcellulose Sodium (LUBRICANT EYE DROPS OP) Apply to eye.    . Cholecalciferol (VITAMIN D-3) 125 MCG (5000 UT) TABS Take 1 tablet by mouth daily.    . clobetasol (TEMOVATE) 0.05 % external  solution Apply 1 application topically 2 (two) times daily. 50 mL 0  . clonazePAM (KLONOPIN) 0.5 MG tablet Take 1 tablet (0.5 mg total) by mouth at bedtime. 30 tablet 1  . doxycycline (VIBRA-TABS) 100 MG tablet Take 1 tablet (100 mg total) by mouth 2 (two) times daily. 20 tablet 0  . escitalopram (LEXAPRO) 20 MG tablet Take 1 tablet (20 mg total) by mouth daily. 90 tablet 0  . fexofenadine (ALLEGRA) 180 MG tablet Take 180 mg by mouth daily.    Marland Kitchen gabapentin (NEURONTIN) 100 MG capsule TAKE 1 CAPSULE BY MOUTH TWICE A DAY 60 capsule 0  . ibuprofen (ADVIL,MOTRIN) 200 MG tablet Take 400 mg by mouth daily as needed. For headache     .  loratadine (CLARITIN) 10 MG tablet Take 10 mg by mouth daily.    . Multiple Vitamin (MULTIVITAMIN) tablet Take 1 tablet by mouth daily.    . polyethylene glycol (MIRALAX / GLYCOLAX) 17 g packet Take 17 g by mouth daily.    Marland Kitchen PREMARIN vaginal cream APPLY 1/2 GRAM VIA APPLICATOR EVERY NIGHT FOR 3 WEEKS THEN TWICE WEEKLY    . SUMAtriptan (IMITREX) 100 MG tablet TAKE ONE TABLET ONE DAILY AT ONSET OF MIGRAINE, REPEAT IN 2 HOURS AS NEEDED NO MORE THAN 2 IN 24 HOURS. 9 tablet 0  . triamcinolone cream (KENALOG) 0.1 % Apply 1 application topically 2 (two) times daily.     No current facility-administered medications on file prior to visit.   No Known Allergies   Family history:  Diabetes in mother and grandmother HTN and HL in mother and maternal aunt Heart disease in mother, maternal grandfather Breast and skin cancer in mother Also, biological father died of cancer  PE: BP 122/70   Pulse 71   Ht 5\' 4"  (1.626 m)   Wt 183 lb (83 kg)   SpO2 98%   BMI 31.41 kg/m  Wt Readings from Last 3 Encounters:  03/31/20 183 lb (83 kg)  01/08/20 181 lb (82.1 kg)  11/29/19 181 lb 12.8 oz (82.5 kg)   Constitutional: overweight, in NAD Eyes: PERRLA, EOMI, no exophthalmos ENT: moist mucous membranes, no thyromegaly, no cervical lymphadenopathy Cardiovascular: RRR, No  MRG Respiratory: CTA B Gastrointestinal: abdomen soft, NT, ND, BS+ Musculoskeletal: no deformities, strength intact in all 4 Skin: moist, warm, no rashes Neurological: no tremor with outstretched hands, DTR normal in all 4  ASSESSMENT: 1. Hypothyroidism -Diagnosed as Hashimoto's hypothyroidism  2.  Vitamin D insufficiency  PLAN:  1. Patient with longstanding hypothyroidism, previously on levothyroxine therapy, with normal TFTs but many nonspecific symptoms which could have been related to the thyroid but also to menopause, depression, insomnia, deconditioning.  However, at last visit,  we discussed about trying a desiccated thyroid extract.  We started Armour thyroid at 45 mg daily and we increase the dose to 60 mg daily in 06/2019.  Subsequent TFTs were at goal on 09/10/2019.  We continued the same dose of Armour. - latest thyroid labs reviewed with pt >> normal: Lab Results  Component Value Date   TSH 1.69 10/25/2019  - pt feels good on this dose. - we discussed about taking the thyroid hormone every day, with water, >30 minutes before breakfast, separated by >4 hours from acid reflux medications, calcium, iron, multivitamins. Pt. is taking it correctly. - At last visit, I advised her to start selenium 200 mcg daily to hopefully reduce antibody levels.  She continues on this. - at today's visit, we will check TFTs and also TPO and ATA antibodies. - I will see her back in 6 months  2. vitamin D insufficiency -We checked a vitamin D level for her due to generalized joint pains.  This returned slightly low. -We started 2000 units vitamin D daily, and then increase to 4000 units in 09/2019 as her vitamin D level was still slightly low at 26.01 then. Now on 5000 units vitamin D. -We will repeat her vitamin D level today  Orders Placed This Encounter  Procedures  . TSH  . T4, free  . T3, free  . Thyroglobulin antibody  . Thyroid peroxidase antibody  . Vitamin D, 25-hydroxy    Component     Latest Ref Rng & Units 03/31/2020  Thyroperoxidase Ab SerPl-aCnc     <  9 IU/mL 59 (H)  Thyroglobulin Ab     < or = 1 IU/mL <1  TSH     0.35 - 4.50 uIU/mL 2.12  T4,Free(Direct)     0.60 - 1.60 ng/dL 0.40 (L)  Triiodothyronine,Free,Serum     2.3 - 4.2 pg/mL 3.0  Vitamin D, 25-Hydroxy     30.0 - 100.0 ng/mL 58.0   TSH and vitamin D level are normal.  TPO antibodies are still elevated but improved.  We will continue selenium.  Philemon Kingdom, MD PhD [facility] Endocrinology

## 2020-04-01 LAB — VITAMIN D 25 HYDROXY (VIT D DEFICIENCY, FRACTURES): Vit D, 25-Hydroxy: 58 ng/mL (ref 30.0–100.0)

## 2020-04-01 LAB — THYROGLOBULIN ANTIBODY: Thyroglobulin Ab: <1 IU/mL (ref <=1)

## 2020-04-01 LAB — THYROID PEROXIDASE ANTIBODY: Thyroperoxidase Ab SerPl-aCnc: 59 IU/mL — ABNORMAL HIGH (ref <9)

## 2020-04-11 ENCOUNTER — Other Ambulatory Visit: Payer: Self-pay

## 2020-04-11 MED ORDER — GABAPENTIN 100 MG PO CAPS: 100.0000 mg | ORAL_CAPSULE | Freq: Two times a day (BID) | ORAL | 0 refills | Status: DC

## 2020-04-11 NOTE — Telephone Encounter (Signed)
Patient is requesting a 90 day supply of this medication

## 2020-04-14 ENCOUNTER — Other Ambulatory Visit: Payer: Self-pay | Admitting: Physician Assistant

## 2020-04-14 ENCOUNTER — Other Ambulatory Visit: Payer: Self-pay | Admitting: Internal Medicine

## 2020-04-21 ENCOUNTER — Other Ambulatory Visit: Payer: Self-pay | Admitting: Physician Assistant

## 2020-04-28 ENCOUNTER — Encounter: Payer: Self-pay | Admitting: Physician Assistant

## 2020-04-28 ENCOUNTER — Telehealth (INDEPENDENT_AMBULATORY_CARE_PROVIDER_SITE_OTHER): Payer: 59 | Admitting: Physician Assistant

## 2020-04-28 VITALS — BP 127/85 | HR 77 | Temp 98.3°F | Ht 65.0 in | Wt 183.0 lb

## 2020-04-28 DIAGNOSIS — J018 Other acute sinusitis: Secondary | ICD-10-CM | POA: Diagnosis not present

## 2020-04-28 DIAGNOSIS — J019 Acute sinusitis, unspecified: Secondary | ICD-10-CM | POA: Insufficient documentation

## 2020-04-28 MED ORDER — AMOXICILLIN-POT CLAVULANATE 875-125 MG PO TABS
1.0000 | ORAL_TABLET | Freq: Two times a day (BID) | ORAL | 0 refills | Status: DC
Start: 2020-04-28 — End: 2020-05-20

## 2020-04-28 NOTE — Progress Notes (Signed)
Virtual Visit via Telephone Note   This visit type was conducted due to national recommendations for restrictions regarding the COVID-19 Pandemic (e.g. social distancing) in an effort to limit this patient's exposure and mitigate transmission in our community.  Due to her co-morbid illnesses, this patient is at least at moderate risk for complications without adequate follow up.  This format is felt to be most appropriate for this patient at this time.  The patient did not have access to video technology/had technical difficulties with video requiring transitioning to audio format only (telephone).  All issues noted in this document were discussed and addressed.  No physical exam could be performed with this format.  Patient verbally consented to a telehealth visit.   Date:  04/28/2020   ID:  [name], DOB May 25, 1966, MRN [id number]  Patient Location: Home Provider Location: Office  PCP:  [name], PA-C    Chief Complaint:  URI/sinusitis  History of Present Illness:    [name] is a 54 y.o. female with sinus symptoms - pt complains of sore throat, headahce, fatigue and cough past several days - has had known exposure to COVID Denies shortness of breath  The patient does have symptoms concerning for COVID-19 infection (fever, chills, cough, or new shortness of breath).    Past Medical History:  Diagnosis Date   Atrophy of thyroid (acquired)    Autoimmune thyroiditis    Chronic fatigue, unspecified    Gastro-esophageal reflux disease without esophagitis    Generalized anxiety disorder    Menopausal and female climacteric states    Migraine without aura, not intractable, without status migrainosus    Peripheral vascular disease ([place]) 1992   blood clot with broken leg, coumadin x 6 mos   Vitamin D deficiency, unspecified    Past Surgical History:  Procedure Laterality Date   Nova sure ablation  2012   rotator cuff surgery  12/2014   TONSILLECTOMY      as a child     Current Meds  Medication Sig   ARMOUR THYROID 60 MG tablet TAKE 1 TABLET (60 MG TOTAL) BY MOUTH DAILY BEFORE BREAKFAST.   Carboxymethylcellulose Sodium (LUBRICANT EYE DROPS OP) Apply to eye.   Cholecalciferol (VITAMIN D-3) 125 MCG (5000 UT) TABS Take 1 tablet by mouth daily.   clobetasol (TEMOVATE) 0.05 % external solution Apply 1 application topically 2 (two) times daily.   clonazePAM (KLONOPIN) 0.5 MG tablet Take 1 tablet (0.5 mg total) by mouth at bedtime.   escitalopram (LEXAPRO) 20 MG tablet TAKE 1 TABLET BY MOUTH EVERY DAY   fexofenadine (ALLEGRA) 180 MG tablet Take 180 mg by mouth daily.   gabapentin (NEURONTIN) 100 MG capsule TAKE 1 CAPSULE BY MOUTH TWICE A DAY   ibuprofen (ADVIL,MOTRIN) 200 MG tablet Take 400 mg by mouth daily as needed. For headache    loratadine (CLARITIN) 10 MG tablet Take 10 mg by mouth daily.   Multiple Vitamin (MULTIVITAMIN) tablet Take 1 tablet by mouth daily.   polyethylene glycol (MIRALAX / GLYCOLAX) 17 g packet Take 17 g by mouth daily.   PREMARIN vaginal cream APPLY 1/2 GRAM VIA APPLICATOR EVERY NIGHT FOR 3 WEEKS THEN TWICE WEEKLY   SUMAtriptan (IMITREX) 100 MG tablet TAKE ONE TABLET ONE DAILY AT ONSET OF MIGRAINE, REPEAT IN 2 HOURS AS NEEDED NO MORE THAN 2 IN 24 HOURS.   triamcinolone cream (KENALOG) 0.1 % Apply 1 application topically 2 (two) times daily.     Allergies:   Patient has  no known allergies.   Social History   Tobacco Use   Smoking status: Never Smoker   Smokeless tobacco: Never Used  Vaping Use   Vaping Use: Never used  Substance Use Topics   Alcohol use: Yes    Alcohol/week: 1.0 standard drink    Types: 1 Glasses of wine per week    Comment: Drinks on a regular basis and average quanity when she drinks is usually 1-2 drinks.   Drug use: No     Family Hx: The patient's family history includes Breast cancer in her mother; Diabetes type II in her mother; Heart attack in her maternal  grandfather and maternal grandmother; Hyperlipidemia in her mother; Hypertension in her mother and sister; Lung cancer in her father; Melanoma in her mother; Migraines in her mother; Stroke in her maternal grandfather.  ROS:   Please see the history of present illness.    All other systems reviewed and are negative.  Labs/Other Tests and Data Reviewed:    Recent Labs: 11/29/2019: ALT 13; BUN 12; Creatinine, Ser 0.58; Hemoglobin 12.8; Platelets 324; Potassium 4.1; Sodium 142 03/31/2020: TSH 2.12   Recent Lipid Panel No results found for: CHOL, TRIG, HDL, CHOLHDL, LDLCALC, LDLDIRECT  Wt Readings from Last 3 Encounters:  04/28/20 183 lb (83 kg)  03/31/20 183 lb (83 kg)  01/08/20 181 lb (82.1 kg)     Objective:    Vital Signs:  BP 127/85 (BP Location: Left Arm, Patient Position: Sitting)    Pulse 77    Temp 98.3 F (36.8 C) (Temporal)    Ht 5\' 5"  (1.651 m)    Wt 183 lb (83 kg)    BMI 30.45 kg/m    VITAL SIGNS:  reviewed  ASSESSMENT & PLAN:    1. Uri/sinusitis - rx for amoxil 875mg  bid and otc decongestants - COVID test pending  COVID-19 Education: The signs and symptoms of COVID-19 were discussed with the patient and how to seek care for testing (follow up with PCP or arrange E-visit). The importance of social distancing was discussed today.  Time:   Today, I have spent 10 minutes with the patient with telehealth technology discussing the above problems.     Medication Adjustments/Labs and Tests Ordered: Current medicines are reviewed at length with the patient today.  Concerns regarding medicines are outlined above.   Tests Ordered: Orders Placed This Encounter  Procedures   Novel Coronavirus, NAA ([facility])    Medication Changes: Meds ordered this encounter  Medications   amoxicillin-clavulanate (AUGMENTIN) 875-125 MG tablet    Sig: Take 1 tablet by mouth 2 (two) times daily.    Dispense:  20 tablet    Refill:  0    Order Specific Question:   Supervising Provider      AnswerShelton Silvas    Follow Up:  In Person prn  Signed, Webb Silversmith, PA-C  04/28/2020 9:16 AM    [place]

## 2020-04-28 NOTE — Assessment & Plan Note (Signed)
rx for augmentin COVID test pending Quarantine as directed otc decongestants as needed

## 2020-04-29 ENCOUNTER — Encounter: Payer: Self-pay | Admitting: Physician Assistant

## 2020-04-29 LAB — NOVEL CORONAVIRUS, NAA: SARS-CoV-2, NAA: DETECTED — AB

## 2020-04-29 LAB — SARS-COV-2, NAA 2 DAY TAT

## 2020-05-01 ENCOUNTER — Other Ambulatory Visit: Payer: Self-pay | Admitting: Internal Medicine

## 2020-05-01 DIAGNOSIS — E66811 Obesity, class 1: Secondary | ICD-10-CM

## 2020-05-01 DIAGNOSIS — U071 COVID-19: Secondary | ICD-10-CM

## 2020-05-01 DIAGNOSIS — E669 Obesity, unspecified: Secondary | ICD-10-CM

## 2020-05-01 NOTE — Progress Notes (Signed)
I connected by phone with [name] on 05/01/2020 at 8:28 AM to discuss the potential use of a new treatment for mild to moderate COVID-19 viral infection in non-hospitalized patients.  This patient is a 54 y.o. female that meets the FDA criteria for Emergency Use Authorization of COVID monoclonal antibody casirivimab/imdevimab.  Has a (+) direct SARS-CoV-2 viral test result  Has mild or moderate COVID-19   Is NOT hospitalized due to COVID-19  Is within 10 days of symptom onset  Has at least one of the high risk factor(s) for progression to severe COVID-19 and/or hospitalization as defined in EUA.  Specific high risk criteria : BMI > 25   I have spoken and communicated the following to the patient or parent/caregiver regarding COVID monoclonal antibody treatment:  1. FDA has authorized the emergency use for the treatment of mild to moderate COVID-19 in adults and pediatric patients with positive results of direct SARS-CoV-2 viral testing who are 29 years of age and older weighing at least 40 kg, and who are at high risk for progressing to severe COVID-19 and/or hospitalization.  2. The significant known and potential risks and benefits of COVID monoclonal antibody, and the extent to which such potential risks and benefits are unknown.  3. Information on available alternative treatments and the risks and benefits of those alternatives, including clinical trials.  4. Patients treated with COVID monoclonal antibody should continue to self-isolate and use infection control measures (e.g., wear mask, isolate, social distance, avoid sharing personal items, clean and disinfect "high touch" surfaces, and frequent handwashing) according to CDC guidelines.   5. The patient or parent/caregiver has the option to accept or refuse COVID monoclonal antibody treatment.  After reviewing this information with the patient, The patient agreed to proceed with receiving casirivimab\imdevimab infusion and  will be provided a copy of the Fact sheet prior to receiving the infusion.   Infusion scheduled for 9/17 at 0830. Sx onset 9/12  [name], NP 05/01/2020 8:28 AM

## 2020-05-02 ENCOUNTER — Ambulatory Visit (HOSPITAL_COMMUNITY)
Admission: RE | Admit: 2020-05-02 | Discharge: 2020-05-02 | Disposition: A | Discharge status: Home / Self Care | Payer: 59 | Source: Ambulatory Visit | Attending: Pulmonary Disease | Admitting: Pulmonary Disease

## 2020-05-02 DIAGNOSIS — U071 COVID-19: Secondary | ICD-10-CM | POA: Diagnosis present

## 2020-05-02 DIAGNOSIS — E669 Obesity, unspecified: Secondary | ICD-10-CM | POA: Diagnosis present

## 2020-05-02 DIAGNOSIS — Z6830 Body mass index (BMI) 30.0-30.9, adult: Secondary | ICD-10-CM | POA: Insufficient documentation

## 2020-05-02 MED ORDER — METHYLPREDNISOLONE SODIUM SUCC 125 MG IJ SOLR: 125.0000 mg | Freq: Once | INTRAMUSCULAR | Status: DC | PRN

## 2020-05-02 MED ORDER — FAMOTIDINE IN NACL 20-0.9 MG/50ML-% IV SOLN: 20.0000 mg | Freq: Once | INTRAVENOUS | Status: DC | PRN

## 2020-05-02 MED ORDER — ALBUTEROL SULFATE HFA 108 (90 BASE) MCG/ACT IN AERS: 2.0000 | INHALATION_SPRAY | Freq: Once | RESPIRATORY_TRACT | Status: DC | PRN

## 2020-05-02 MED ORDER — DIPHENHYDRAMINE HCL 50 MG/ML IJ SOLN: 50.0000 mg | Freq: Once | INTRAMUSCULAR | Status: DC | PRN

## 2020-05-02 MED ORDER — SODIUM CHLORIDE 0.9 % IV SOLN
1200.0000 mg | Freq: Once | INTRAVENOUS | Status: AC
  Administered 2020-05-02: 1200 mg via INTRAVENOUS

## 2020-05-02 MED ORDER — EPINEPHRINE 0.3 MG/0.3ML IJ SOAJ: 0.3000 mg | Freq: Once | INTRAMUSCULAR | Status: DC | PRN

## 2020-05-02 MED ORDER — SODIUM CHLORIDE 0.9 % IV SOLN: INTRAVENOUS | Status: DC | PRN

## 2020-05-02 NOTE — Discharge Instructions (Signed)

## 2020-05-02 NOTE — Progress Notes (Signed)
  Diagnosis: COVID-19  Physician:Dr Joya Gaskins   Procedure: [facility] In[facility] Med: casirivimab\imdevimab infusion - Provided patient with casirivimab\imdevimab fact sheet for patients, parents and caregivers prior to infusion.  Complications: No immediate complications noted.  Discharge: Discharged home   [name] 05/02/2020

## 2020-05-09 ENCOUNTER — Encounter: Payer: Self-pay | Admitting: Physician Assistant

## 2020-05-20 ENCOUNTER — Other Ambulatory Visit: Payer: Self-pay

## 2020-05-20 ENCOUNTER — Ambulatory Visit (INDEPENDENT_AMBULATORY_CARE_PROVIDER_SITE_OTHER): Payer: 59 | Admitting: Physician Assistant

## 2020-05-20 ENCOUNTER — Encounter: Payer: Self-pay | Admitting: Physician Assistant

## 2020-05-20 VITALS — BP 130/82 | HR 68 | Temp 96.8°F | Ht 64.0 in | Wt 181.2 lb

## 2020-05-20 DIAGNOSIS — G4489 Other headache syndrome: Secondary | ICD-10-CM | POA: Insufficient documentation

## 2020-05-20 MED ORDER — TRAMADOL HCL 50 MG PO TABS: 50.0000 mg | ORAL_TABLET | Freq: Three times a day (TID) | ORAL | 0 refills | Status: DC | PRN

## 2020-05-20 NOTE — Progress Notes (Signed)
Acute Office Visit  Subjective:    Patient ID: [name], female    DOB: [birth year], 54 y.o.   MRN: [id number]  Chief Complaint  Patient presents with  . Headaches post COVID    HPI Patient is in today for complaints of daily headaches since having COVID 2 weeks ago - describes on top of her head and radiates to top of shoulder - does not feel like her normal migraines and has taken her imitrex which has not helped At times still feels sinus pressure and occasional ear pain Denies vision problems - has appt tomorrow for annual eye exam  Past Medical History:  Diagnosis Date  . Atrophy of thyroid (acquired)   . Autoimmune thyroiditis   . Chronic fatigue, unspecified   . Gastro-esophageal reflux disease without esophagitis   . Generalized anxiety disorder   . Menopausal and female climacteric states   . Migraine without aura, not intractable, without status migrainosus   . Peripheral vascular disease ([place]) 1992   blood clot with broken leg, coumadin x 6 mos  . Vitamin D deficiency, unspecified     Past Surgical History:  Procedure Laterality Date  . Nova sure ablation  2012  . rotator cuff surgery  12/2014  . TONSILLECTOMY     as a child    Family History  Problem Relation Age of Onset  . Hypertension Mother   . Hyperlipidemia Mother   . Breast cancer Mother   . Melanoma Mother   . Migraines Mother   . Diabetes type II Mother   . Lung cancer Father   . Hypertension Sister   . Heart attack Maternal Grandmother   . Heart attack Maternal Grandfather   . Stroke Maternal Grandfather     Social History   Socioeconomic History  . Marital status: Married    Spouse name: Not on file  . Number of children: 2  . Years of education: Not on file  . Highest education level: Not on file  Occupational History  . Occupation: Hair stylist  Tobacco Use  . Smoking status: Never Smoker  . Smokeless tobacco: Never Used  Vaping Use  . Vaping Use: Never used    Substance and Sexual Activity  . Alcohol use: Yes    Alcohol/week: 1.0 standard drink    Types: 1 Glasses of wine per week    Comment: Drinks on a regular basis and average quanity when she drinks is usually 1-2 drinks.  . Drug use: No  . Sexual activity: Yes    Birth control/protection: Surgical    Comment: husband had vasectomy  Other Topics Concern  . Not on file  Social History Narrative  . Not on file   Social Determinants of Health   Financial Resource Strain:   . Difficulty of Paying Living Expenses: Not on file  Food Insecurity:   . Worried About Charity fundraiser in the Last Year: Not on file  . Ran Out of Food in the Last Year: Not on file  Transportation Needs:   . Lack of Transportation (Medical): Not on file  . Lack of Transportation (Non-Medical): Not on file  Physical Activity:   . Days of Exercise per Week: Not on file  . Minutes of Exercise per Session: Not on file  Stress:   . Feeling of Stress : Not on file  Social Connections:   . Frequency of Communication with Friends and Family: Not on file  . Frequency of Social Gatherings with  Friends and Family: Not on file  . Attends Religious Services: Not on file  . Active Member of Clubs or Organizations: Not on file  . Attends Archivist Meetings: Not on file  . Marital Status: Not on file  Intimate Partner Violence:   . Fear of Current or Ex-Partner: Not on file  . Emotionally Abused: Not on file  . Physically Abused: Not on file  . Sexually Abused: Not on file     Current Outpatient Medications:  .  ARMOUR THYROID 60 MG tablet, TAKE 1 TABLET (60 MG TOTAL) BY MOUTH DAILY BEFORE BREAKFAST., Disp: 90 tablet, Rfl: 2 .  Cholecalciferol (VITAMIN D-3) 125 MCG (5000 UT) TABS, Take 1 tablet by mouth daily., Disp: , Rfl:  .  clonazePAM (KLONOPIN) 0.5 MG tablet, Take 1 tablet (0.5 mg total) by mouth at bedtime., Disp: 30 tablet, Rfl: 1 .  escitalopram (LEXAPRO) 20 MG tablet, TAKE 1 TABLET BY MOUTH  EVERY DAY, Disp: 90 tablet, Rfl: 0 .  fexofenadine (ALLEGRA) 180 MG tablet, Take 180 mg by mouth daily., Disp: , Rfl:  .  gabapentin (NEURONTIN) 100 MG capsule, TAKE 1 CAPSULE BY MOUTH TWICE A DAY, Disp: 60 capsule, Rfl: 0 .  ibuprofen (ADVIL,MOTRIN) 200 MG tablet, Take 400 mg by mouth daily as needed. For headache , Disp: , Rfl:  .  loratadine (CLARITIN) 10 MG tablet, Take 10 mg by mouth daily., Disp: , Rfl:  .  Multiple Vitamin (MULTIVITAMIN) tablet, Take 1 tablet by mouth daily., Disp: , Rfl:  .  polyethylene glycol (MIRALAX / GLYCOLAX) 17 g packet, Take 17 g by mouth daily., Disp: , Rfl:  .  SUMAtriptan (IMITREX) 100 MG tablet, TAKE ONE TABLET ONE DAILY AT ONSET OF MIGRAINE, REPEAT IN 2 HOURS AS NEEDED NO MORE THAN 2 IN 24 HOURS., Disp: 9 tablet, Rfl: 0 .  traMADol (ULTRAM) 50 MG tablet, Take 1 tablet (50 mg total) by mouth every 8 (eight) hours as needed for up to 5 days., Disp: 15 tablet, Rfl: 0   No Known Allergies  CONSTITUTIONAL: Negative for chills, fatigue, fever, unintentional weight gain and unintentional weight loss.  E/N/T: see HPI CARDIOVASCULAR: Negative for chest pain, dizziness, palpitations and pedal edema.  RESPIRATORY: Negative for recent cough and dyspnea.  GASTROINTESTINAL: Negative for abdominal pain, acid reflux symptoms, constipation, diarrhea, nausea and vomiting.  MSK: Negative for arthralgias and myalgias.  INTEGUMENTARY: Negative for rash.  NEUROLOGICAL: see HPI PSYCHIATRIC: Negative for sleep disturbance and to question depression screen.  Negative for depression, negative for anhedonia.         Objective:    PHYSICAL EXAM:   VS: BP 130/82 (BP Location: Left Arm, Patient Position: Sitting)   Pulse 68   Temp (!) 96.8 F (36 C) (Temporal)   Ht 5\' 4"  (1.626 m)   Wt 181 lb 3.2 oz (82.2 kg)   SpO2 99%   BMI 31.10 kg/m   GEN: Well nourished, well developed, in no acute distress  HEENT: normal external ears and nose - normal external auditory canals and  TMS -- Lips, Teeth and Gums - normal  Oropharynx - normal mucosa, palate, and posterior pharynx Cardiac: RRR; no murmurs, rubs, or gallops,no edema -  Respiratory:  normal respiratory rate and pattern with no distress - normal breath sounds with no rales, rhonchi, wheezes or rubs Neuro:  Alert and Oriented x 3, Strength and sensation are intact - CN II-Xii grossly intact Psych: euthymic mood, appropriate affect and demeanor   Wt  Readings from Last 3 Encounters:  05/20/20 181 lb 3.2 oz (82.2 kg)  04/28/20 183 lb (83 kg)  03/31/20 183 lb (83 kg)    Health Maintenance Due  Topic Date Due  . Hepatitis C Screening  Never done  . HIV Screening  Never done  . TETANUS/TDAP  Never done  . INFLUENZA VACCINE  03/16/2020    There are no preventive care reminders to display for this patient.        Assessment & Plan:   Problem List Items Addressed This Visit      Other   Other headache syndrome - Primary    labwork pending Continue current meds as directed rx for short term       Relevant Medications   traMADol (ULTRAM) 50 MG tablet   Other Relevant Orders   CBC with Differential/Platelet   Sedimentation rate   CT Head Wo Contrast       Meds ordered this encounter  Medications  . traMADol (ULTRAM) 50 MG tablet    Sig: Take 1 tablet (50 mg total) by mouth every 8 (eight) hours as needed for up to 5 days.    Dispense:  15 tablet    Refill:  0    Order Specific Question:   Supervising Provider    Answer:   COX, Elnita Maxwell [[id number]]     [place], PA-C

## 2020-05-20 NOTE — Assessment & Plan Note (Signed)
labwork pending Continue current meds as directed rx for short term

## 2020-05-21 LAB — CBC WITH DIFFERENTIAL/PLATELET
Basophils Absolute: 0.1 10*3/uL (ref 0.0–0.2)
Basos: 1 %
EOS (ABSOLUTE): 0.1 10*3/uL (ref 0.0–0.4)
Eos: 2 %
Hematocrit: 36.9 % (ref 34.0–46.6)
Hemoglobin: 12.3 g/dL (ref 11.1–15.9)
Immature Grans (Abs): 0 10*3/uL (ref 0.0–0.1)
Immature Granulocytes: 1 %
Lymphocytes Absolute: 2.8 10*3/uL (ref 0.7–3.1)
Lymphs: 38 %
MCH: 29.9 pg (ref 26.6–33.0)
MCHC: 33.3 g/dL (ref 31.5–35.7)
MCV: 90 fL (ref 79–97)
Monocytes Absolute: 0.6 10*3/uL (ref 0.1–0.9)
Monocytes: 8 %
Neutrophils Absolute: 3.6 10*3/uL (ref 1.4–7.0)
Neutrophils: 50 %
Platelets: 293 10*3/uL (ref 150–450)
RBC: 4.11 x10E6/uL (ref 3.77–5.28)
RDW: 13.3 % (ref 11.7–15.4)
WBC: 7.2 10*3/uL (ref 3.4–10.8)

## 2020-05-21 LAB — SEDIMENTATION RATE: Sed Rate: 2 mm/hr (ref 0–40)

## 2020-05-27 ENCOUNTER — Ambulatory Visit (INDEPENDENT_AMBULATORY_CARE_PROVIDER_SITE_OTHER): Payer: 59 | Admitting: Otolaryngology

## 2020-06-08 ENCOUNTER — Other Ambulatory Visit: Payer: Self-pay | Admitting: Physician Assistant

## 2020-06-09 ENCOUNTER — Other Ambulatory Visit: Payer: Self-pay | Admitting: Physician Assistant

## 2020-06-27 ENCOUNTER — Other Ambulatory Visit: Payer: Self-pay

## 2020-06-27 ENCOUNTER — Ambulatory Visit (INDEPENDENT_AMBULATORY_CARE_PROVIDER_SITE_OTHER): Payer: 59

## 2020-06-27 DIAGNOSIS — Z23 Encounter for immunization: Secondary | ICD-10-CM

## 2020-07-13 ENCOUNTER — Other Ambulatory Visit: Payer: Self-pay | Admitting: Physician Assistant

## 2020-07-30 ENCOUNTER — Other Ambulatory Visit: Payer: Self-pay | Admitting: Physician Assistant

## 2020-08-02 ENCOUNTER — Other Ambulatory Visit: Payer: Self-pay | Admitting: Physician Assistant

## 2020-08-02 DIAGNOSIS — G4489 Other headache syndrome: Secondary | ICD-10-CM

## 2020-08-12 ENCOUNTER — Encounter: Payer: Self-pay | Admitting: Physician Assistant

## 2020-08-12 ENCOUNTER — Other Ambulatory Visit: Payer: Self-pay

## 2020-08-12 ENCOUNTER — Ambulatory Visit: Payer: 59 | Admitting: Physician Assistant

## 2020-08-12 VITALS — BP 118/76 | HR 85 | Temp 97.3°F | Ht 64.0 in | Wt 187.6 lb

## 2020-08-12 DIAGNOSIS — M5441 Lumbago with sciatica, right side: Secondary | ICD-10-CM

## 2020-08-12 DIAGNOSIS — M5442 Lumbago with sciatica, left side: Secondary | ICD-10-CM | POA: Diagnosis not present

## 2020-08-12 MED ORDER — PREDNISONE 20 MG PO TABS
ORAL_TABLET | ORAL | 0 refills | Status: AC
Start: 2020-08-12 — End: 2020-08-21

## 2020-08-12 MED ORDER — CYCLOBENZAPRINE HCL 5 MG PO TABS: 5.0000 mg | ORAL_TABLET | Freq: Every day | ORAL | 0 refills | Status: DC

## 2020-08-12 NOTE — Progress Notes (Signed)
Acute Office Visit  Subjective:    Patient ID: [name], female    DOB: Jan 06, 1966, 54 y.o.   MRN: [id number]  Chief Complaint  Patient presents with   Back Pain    HPI Patient is in today for complaints of back pain Pt states that for the past 6 months she has had issues with back pain - feels like a heaviness in her lower back at times - causes pain to go down both legs and some tingling down both legs States that she has had more pain with bending Last week she got up to walk and not able to move right leg causing her to fall forward  Past Medical History:  Diagnosis Date   Atrophy of thyroid (acquired)    Autoimmune thyroiditis    Chronic fatigue, unspecified    Gastro-esophageal reflux disease without esophagitis    Generalized anxiety disorder    Menopausal and female climacteric states    Migraine without aura, not intractable, without status migrainosus    Peripheral vascular disease ([facility]) 1992   blood clot with broken leg, coumadin x 6 mos   Vitamin D deficiency, unspecified     Past Surgical History:  Procedure Laterality Date   Nova sure ablation  2012   rotator cuff surgery  12/2014   TONSILLECTOMY     as a child    Family History  Problem Relation Age of Onset   Hypertension Mother    Hyperlipidemia Mother    Breast cancer Mother    Melanoma Mother    Migraines Mother    Diabetes type II Mother    Lung cancer Father    Hypertension Sister    Heart attack Maternal Grandmother    Heart attack Maternal Grandfather    Stroke Maternal Grandfather     Social History   Socioeconomic History   Marital status: Married    Spouse name: Not on file   Number of children: 2   Years of education: Not on file   Highest education level: Not on file  Occupational History   Occupation: Hair stylist  Tobacco Use   Smoking status: Never Smoker   Smokeless tobacco: Never Used  Vaping Use   Vaping Use: Never used   Substance and Sexual Activity   Alcohol use: Yes    Alcohol/week: 1.0 standard drink    Types: 1 Glasses of wine per week    Comment: Drinks on a regular basis and average quanity when she drinks is usually 1-2 drinks.   Drug use: No   Sexual activity: Yes    Birth control/protection: Surgical    Comment: husband had vasectomy  Other Topics Concern   Not on file  Social History Narrative   Not on file   Social Determinants of Health   Financial Resource Strain: Not on file  Food Insecurity: Not on file  Transportation Needs: Not on file  Physical Activity: Not on file  Stress: Not on file  Social Connections: Not on file  Intimate Partner Violence: Not on file    Outpatient Medications Prior to Visit  Medication Sig Dispense Refill   ARMOUR THYROID 60 MG tablet TAKE 1 TABLET (60 MG TOTAL) BY MOUTH DAILY BEFORE BREAKFAST. 90 tablet 2   Cholecalciferol (VITAMIN D-3) 125 MCG (5000 UT) TABS Take 1 tablet by mouth daily.     clonazePAM (KLONOPIN) 0.5 MG tablet TAKE 1 TABLET BY MOUTH AT BEDTIME. 30 tablet 0   escitalopram (LEXAPRO) 20 MG tablet  TAKE 1 TABLET BY MOUTH EVERY DAY 90 tablet 0   fexofenadine (ALLEGRA) 180 MG tablet Take 180 mg by mouth daily.     gabapentin (NEURONTIN) 100 MG capsule TAKE 1 CAPSULE BY MOUTH TWICE A DAY 60 capsule 2   ibuprofen (ADVIL,MOTRIN) 200 MG tablet Take 400 mg by mouth daily as needed. For headache     loratadine (CLARITIN) 10 MG tablet Take 10 mg by mouth daily.     Multiple Vitamin (MULTIVITAMIN) tablet Take 1 tablet by mouth daily.     polyethylene glycol (MIRALAX / GLYCOLAX) 17 g packet Take 17 g by mouth daily.     SUMAtriptan (IMITREX) 100 MG tablet TAKE ONE TABLET ONE DAILY AT ONSET OF MIGRAINE, REPEAT IN 2 HOURS AS NEEDED NO MORE THAN 2 IN 24 HOURS. 9 tablet 2   No facility-administered medications prior to visit.    No Known Allergies  Review of Systems CONSTITUTIONAL: Negative for chills, fatigue, fever,  unintentional weight gain and unintentional weight loss.  CARDIOVASCULAR: Negative for chest pain, dizziness, palpitations and pedal edema.  RESPIRATORY: Negative for recent cough and dyspnea.  MSK: see HPI INTEGUMENTARY: Negative for rash.  NEUROLOGICAL: see HPI         Objective:    Physical Exam PHYSICAL EXAM:   VS: BP 118/76 (BP Location: Right Arm, Patient Position: Sitting, Cuff Size: Normal)    Pulse 85    Temp (!) 97.3 F (36.3 C) (Temporal)    Ht 5\' 4"  (1.626 m)    Wt 187 lb 9.6 oz (85.1 kg)    SpO2 97%    BMI 32.20 kg/m   GEN: Well nourished, well developed, in no acute distress   Cardiac: RRR; no murmurs, rubs, or gallops,no edema -  Respiratory:  normal respiratory rate and pattern with no distress - normal breath sounds with no rales, rhonchi, wheezes or rubs  MS: no deformity or atrophy - rom normal of lower extremities - strength normal Skin: warm and dry, no rash  Neuro:  Alert and Oriented x 3, Strength and sensation are intact - CN II-Xii grossly intact   BP 118/76 (BP Location: Right Arm, Patient Position: Sitting, Cuff Size: Normal)    Pulse 85    Temp (!) 97.3 F (36.3 C) (Temporal)    Ht 5\' 4"  (1.626 m)    Wt 187 lb 9.6 oz (85.1 kg)    SpO2 97%    BMI 32.20 kg/m  Wt Readings from Last 3 Encounters:  08/12/20 187 lb 9.6 oz (85.1 kg)  05/20/20 181 lb 3.2 oz (82.2 kg)  04/28/20 183 lb (83 kg)    Health Maintenance Due  Topic Date Due   Hepatitis C Screening  Never done   HIV Screening  Never done   TETANUS/TDAP  Never done   COVID-19 Vaccine (3 - Booster for Moderna series) 05/23/2020    There are no preventive care reminders to display for this patient.   Lab Results  Component Value Date   TSH 2.12 03/31/2020   Lab Results  Component Value Date   WBC 7.2 05/20/2020   HGB 12.3 05/20/2020   HCT 36.9 05/20/2020   MCV 90 05/20/2020   PLT 293 05/20/2020   Lab Results  Component Value Date   NA 142 11/29/2019   K 4.1 11/29/2019   CO2  26 11/29/2019   GLUCOSE 84 11/29/2019   BUN 12 11/29/2019   CREATININE 0.58 11/29/2019   BILITOT 0.2 11/29/2019   ALKPHOS 81 11/29/2019  AST 19 11/29/2019   ALT 13 11/29/2019   PROT 7.2 11/29/2019   ALBUMIN 4.7 11/29/2019   CALCIUM 9.8 11/29/2019   No results found for: CHOL No results found for: HDL No results found for: LDLCALC No results found for: TRIG No results found for: CHOLHDL No results found for: ZOXW9U     Assessment & Plan:  1. Acute bilateral low back pain with bilateral sciatica - DG Lumbar Spine Complete - MR Lumbar Spine Wo Contrast    Meds ordered this encounter  Medications   predniSONE (DELTASONE) 20 MG tablet    Sig: Take 3 tablets (60 mg total) by mouth daily with breakfast for 3 days, THEN 2 tablets (40 mg total) daily with breakfast for 3 days, THEN 1 tablet (20 mg total) daily with breakfast for 3 days.    Dispense:  18 tablet    Refill:  0    Order Specific Question:   Supervising Provider    Answer:   Corey Harold   cyclobenzaprine (FLEXERIL) 5 MG tablet    Sig: Take 1 tablet (5 mg total) by mouth at bedtime.    Dispense:  30 tablet    Refill:  0    Order Specific Question:   Supervising Provider    AnswerCorey Harold    Orders Placed This Encounter  Procedures   DG Lumbar Spine Complete   MR Lumbar Spine Wo Contrast      Follow-up: No follow-ups on file.  An After Visit Summary was printed and given to the patient.  Jettie Pagan [facility] [phone]

## 2020-08-21 ENCOUNTER — Other Ambulatory Visit: Payer: Self-pay | Admitting: Physician Assistant

## 2020-08-21 ENCOUNTER — Telehealth: Payer: Self-pay

## 2020-08-21 NOTE — Telephone Encounter (Signed)
Pt made aware of her Mri results and would like for you to schedule MRI of kidneys.

## 2020-08-22 ENCOUNTER — Other Ambulatory Visit: Payer: Self-pay | Admitting: Physician Assistant

## 2020-08-22 DIAGNOSIS — N289 Disorder of kidney and ureter, unspecified: Secondary | ICD-10-CM

## 2020-08-22 DIAGNOSIS — N2889 Other specified disorders of kidney and ureter: Secondary | ICD-10-CM

## 2020-09-04 ENCOUNTER — Other Ambulatory Visit: Payer: Self-pay | Admitting: Family Medicine

## 2020-09-18 ENCOUNTER — Other Ambulatory Visit: Payer: Self-pay | Admitting: Family Medicine

## 2020-10-13 ENCOUNTER — Other Ambulatory Visit: Payer: Self-pay | Admitting: Physician Assistant

## 2020-10-28 ENCOUNTER — Encounter: Payer: Self-pay | Admitting: Internal Medicine

## 2020-10-28 DIAGNOSIS — E039 Hypothyroidism, unspecified: Secondary | ICD-10-CM

## 2020-10-30 ENCOUNTER — Other Ambulatory Visit: Payer: Self-pay

## 2020-10-30 ENCOUNTER — Encounter: Payer: Self-pay | Admitting: Internal Medicine

## 2020-10-30 ENCOUNTER — Other Ambulatory Visit (INDEPENDENT_AMBULATORY_CARE_PROVIDER_SITE_OTHER): Payer: 59

## 2020-10-30 DIAGNOSIS — E039 Hypothyroidism, unspecified: Secondary | ICD-10-CM | POA: Diagnosis not present

## 2020-10-30 LAB — T4, FREE: Free T4: 0.32 ng/dL — ABNORMAL LOW (ref 0.60–1.60)

## 2020-10-30 LAB — TSH: TSH: 3.32 u[IU]/mL (ref 0.35–4.50)

## 2020-10-30 LAB — T3, FREE: T3, Free: 2.8 pg/mL (ref 2.3–4.2)

## 2020-10-31 ENCOUNTER — Other Ambulatory Visit: Payer: Self-pay | Admitting: Internal Medicine

## 2020-10-31 DIAGNOSIS — E039 Hypothyroidism, unspecified: Secondary | ICD-10-CM

## 2020-10-31 MED ORDER — ARMOUR THYROID 15 MG PO TABS: 15.0000 mg | ORAL_TABLET | Freq: Every day | ORAL | 3 refills | Status: DC

## 2020-10-31 MED ORDER — ARMOUR THYROID 60 MG PO TABS
60.0000 mg | ORAL_TABLET | Freq: Every day | ORAL | 3 refills | Status: DC
Start: 2020-10-31 — End: 2020-12-08

## 2020-11-11 ENCOUNTER — Other Ambulatory Visit: Payer: Self-pay | Admitting: Physician Assistant

## 2020-12-08 ENCOUNTER — Encounter: Payer: Self-pay | Admitting: Internal Medicine

## 2020-12-08 ENCOUNTER — Ambulatory Visit (INDEPENDENT_AMBULATORY_CARE_PROVIDER_SITE_OTHER): Payer: 59 | Admitting: Internal Medicine

## 2020-12-08 ENCOUNTER — Other Ambulatory Visit: Payer: Self-pay

## 2020-12-08 ENCOUNTER — Other Ambulatory Visit: Payer: Self-pay | Admitting: Family Medicine

## 2020-12-08 VITALS — BP 128/82 | HR 75 | Ht 64.0 in | Wt 191.8 lb

## 2020-12-08 DIAGNOSIS — E063 Autoimmune thyroiditis: Secondary | ICD-10-CM | POA: Diagnosis not present

## 2020-12-08 DIAGNOSIS — E038 Other specified hypothyroidism: Secondary | ICD-10-CM | POA: Diagnosis not present

## 2020-12-08 DIAGNOSIS — E559 Vitamin D deficiency, unspecified: Secondary | ICD-10-CM | POA: Diagnosis not present

## 2020-12-08 LAB — T4, FREE: Free T4: 0.37 ng/dL — ABNORMAL LOW (ref 0.60–1.60)

## 2020-12-08 LAB — T3, FREE: T3, Free: 2.9 pg/mL (ref 2.3–4.2)

## 2020-12-08 LAB — TSH: TSH: 0.78 u[IU]/mL (ref 0.35–4.50)

## 2020-12-08 MED ORDER — ARMOUR THYROID 60 MG PO TABS: 60.0000 mg | ORAL_TABLET | Freq: Every day | ORAL | 3 refills | Status: DC

## 2020-12-08 MED ORDER — ARMOUR THYROID 15 MG PO TABS: 15.0000 mg | ORAL_TABLET | Freq: Every day | ORAL | 3 refills | Status: DC

## 2020-12-08 NOTE — Patient Instructions (Addendum)
Please stop at the lab.  Continue Armour thyroid 75 mg daily.  Take the thyroid hormone every day, with water, at least 30 minutes before breakfast, separated by at least 4 hours from: - acid reflux medications - calcium - iron - multivitamins  Continue Selenium 200 mcg daily.  Continue vitamin D 5000 units daily.  Please come back for a follow-up appointment in 1 year.

## 2020-12-08 NOTE — Progress Notes (Addendum)
0 patient ID: [name], female   DOB: 05-30-1966, 55 y.o.   MRN: [id number]   This visit occurred during the SARS-CoV-2 public health emergency.  Safety protocols were in place, including screening questions prior to the visit, additional usage of staff PPE, and extensive cleaning of exam room while observing appropriate contact time as indicated for disinfecting solutions.   HPI  Kaylee KettleWilla M Mccoll is a 55 y.o.-year-old female, initially referred by her PCP, Dr. Billy Coastaavon, returning for follow-up for hypothyroidism, diagnosed as Hashimoto's hypothyroidism at last visit.  Last visit 8 months ago.  Interim history: Since last visit, she had COVID-19 in 04/2020.  She got the antibody infusion. She continued to have migraines, now resolved. She feels more tired, also mentions that she cannot lose weight.  Reviewed history: Patient was diagnosed with hypothyroidism in 11/2005 >> she initially started on Synthroid d.a.w. 75 mcg daily.  Her hypothyroidism was previously managed by Dr. Billy Coastaavon and more recently by her PCP.  Her dose was reduced in the last year from 112 down to 75 mcg daily, dose that she continued at our 1st visit in 05/2019.  At that time, she had several symptoms possibly related to the thyroid so we changed to Armour Thyroid. We initially started 45 mg daily in 05/2019, and we increased the dose to 60 mg daily.  Pt continues on Armour 75 (60+15 mg) daily - dose increased 10/2020: - in am - fasting - at least 1-3 hours from b'fast - no Ca, Fe, MVI, PPIs -Previously on biotin 5000 mcg daily, now off  Reviewed patient's TFTs: Lab Results  Component Value Date   TSH 3.32 10/30/2020   TSH 2.12 03/31/2020   TSH 1.69 10/25/2019   TSH 2.34 09/10/2019   TSH 4.79 (H) 07/05/2019   TSH 1.43 05/24/2019   FREET4 0.32 (L) 10/30/2020   FREET4 0.40 (L) 03/31/2020   FREET4 0.48 (L) 10/25/2019   FREET4 0.49 (L) 09/10/2019   FREET4 0.44 (L) 07/05/2019   FREET4 0.69 05/24/2019   T3FREE  2.8 10/30/2020   T3FREE 3.0 03/31/2020   T3FREE 2.8 10/25/2019   T3FREE 3.8 09/10/2019   T3FREE 3.2 07/05/2019   T3FREE 2.9 05/24/2019  04/02/2019: TSH 1.81 (0.4-4.0) - on 75 mcg daily 02/15/2018: TSH TSH 0.23 - on 112 mcg daily >> dose decreased to 88 mcg daily  Her antithyroid antibodies were elevated pointing towards a diagnosis of Hashimoto's thyroiditis: Component     Latest Ref Rng & Units 03/31/2020  Thyroglobulin Ab     < or = 1 IU/mL <1  Thyroperoxidase Ab SerPl-aCnc     <9 IU/mL 59 (H)   Component     Latest Ref Rng & Units 05/24/2019  Thyroperoxidase Ab SerPl-aCnc     <9 IU/mL 98 (H)  Thyroglobulin Ab     < or = 1 IU/mL <1   We started selenium 200 mcg daily in 09/2019.  She continues this now.  She describes several symptoms which improved after switching to Armour Thyroid: Fatigue, foggy brain, insomnia, weight gain (50 pounds in 5 years), joint pain, heat and cold intolerance, constipation, dry skin, hair loss, depression.  Pt denies: - feeling nodules in neck - dysphagia - choking - SOB with lying down + Some hoarseness in a.m.  She has + FH of thyroid disorders in: M aunt and uncle. No FH of thyroid cancer. No h/o radiation tx to head or neck.  No herbal supplements. No recent steroids use.  Previously on thyroid  Energy- Ashwaganda, Iodine, L-thyroine -but stopped due to GERD  Pt. also has a history of anxiety and depression -on clonazepam and Lexapro.  She has a history of vitamin D insufficiency, discovered in the setting of generalized joint pains.  She had a mildly low vitamin D level, which improved after starting supplementation: Lab Results  Component Value Date   VD25OH 58.0 03/31/2020   VD25OH 26.01 (L) 10/25/2019   VD25OH 26.80 (L) 05/24/2019   We started 2000 units vitamin D daily.  We increased the dose to 4000 units daily and then to 5000 units daily.  She continues on this dose today.  ROS: Constitutional: + weight gain, + fatigue Eyes:  no blurry vision, + xerophthalmia ENT: no sore throat, + see HPI Cardiovascular: no CP/no SOB/no palpitations/no leg swelling Respiratory: no cough/no SOB/no wheezing Gastrointestinal: no N/no V/no D/no C Musculoskeletal: no Muscle aches/ joint aches Skin: no rashes Neurological: no tremors/no numbness/no tingling/no dizziness  I reviewed pt's medications, allergies, PMH, social hx, family hx, and changes were documented in the history of present illness. Otherwise, unchanged from my initial visit note.  Past Medical History:  Diagnosis Date  . Atrophy of thyroid (acquired)   . Autoimmune thyroiditis   . Chronic fatigue, unspecified   . Gastro-esophageal reflux disease without esophagitis   . Generalized anxiety disorder   . Menopausal and female climacteric states   . Migraine without aura, not intractable, without status migrainosus   . Peripheral vascular disease ([place]) 1992   blood clot with broken leg, coumadin x 6 mos  . Vitamin D deficiency, unspecified    Past Surgical History:  Procedure Laterality Date  . Nova sure ablation  2012  . rotator cuff surgery  12/2014  . TONSILLECTOMY     as a child   Social History   Socioeconomic History  . Marital status: Married    Spouse name: Not on file  . Number of children: 47 -49 years old in 05/2019 she also takes care of her 81-year-old niece  . Years of education: Not on file  . Highest education level: Not on file  Occupational History  .  Hairstylist  Social Needs  . Financial resource strain: Not on file  . Food insecurity    Worry: Not on file    Inability: Not on file  . Transportation needs    Medical: Not on file    Non-medical: Not on file  Tobacco Use  . Smoking status: Never Smoker  . Smokeless tobacco: Never Used  Substance and Sexual Activity  . Alcohol use: Yes    Alcohol/week:     Types:  3 drinks per week-wine or beer  . Drug use: No  . Sexual activity: Yes    Birth control/protection: Surgical     Comment: husband had vasectomy   Current Outpatient Medications on File Prior to Visit  Medication Sig Dispense Refill  . ARMOUR THYROID 15 MG tablet Take 1 tablet (15 mg total) by mouth daily. 45 tablet 3  . ARMOUR THYROID 60 MG tablet Take 1 tablet (60 mg total) by mouth daily before breakfast. 45 tablet 3  . Cholecalciferol (VITAMIN D-3) 125 MCG (5000 UT) TABS Take 1 tablet by mouth daily.    . clonazePAM (KLONOPIN) 0.5 MG tablet TAKE 1 TABLET BY MOUTH EVERYDAY AT BEDTIME 30 tablet 0  . cyclobenzaprine (FLEXERIL) 5 MG tablet Take 1 tablet (5 mg total) by mouth at bedtime. 30 tablet 0  . escitalopram (LEXAPRO) 20 MG tablet  TAKE 1 TABLET BY MOUTH EVERY DAY 90 tablet 0  . fexofenadine (ALLEGRA) 180 MG tablet Take 180 mg by mouth daily.    Marland Kitchen gabapentin (NEURONTIN) 100 MG capsule TAKE 1 CAPSULE BY MOUTH TWICE A DAY 60 capsule 2  . ibuprofen (ADVIL,MOTRIN) 200 MG tablet Take 400 mg by mouth daily as needed. For headache    . loratadine (CLARITIN) 10 MG tablet Take 10 mg by mouth daily.    . Multiple Vitamin (MULTIVITAMIN) tablet Take 1 tablet by mouth daily.    . polyethylene glycol (MIRALAX / GLYCOLAX) 17 g packet Take 17 g by mouth daily.    . SUMAtriptan (IMITREX) 100 MG tablet TAKE ONE TABLET ONE DAILY AT ONSET OF MIGRAINE, REPEAT IN 2 HOURS AS NEEDED NO MORE THAN 2 IN 24 HOURS. 9 tablet 2   No current facility-administered medications on file prior to visit.   No Known Allergies   Family history:  Diabetes in mother and grandmother HTN and HL in mother and maternal aunt Heart disease in mother, maternal grandfather Breast and skin cancer in mother Also, biological father died of cancer  PE: BP 128/82 (BP Location: Right Arm, Patient Position: Sitting, Cuff Size: Normal)   Pulse 75   Ht 5\' 4"  (1.626 m)   Wt 191 lb 12.8 oz (87 kg)   SpO2 97%   BMI 32.92 kg/m  Wt Readings from Last 3 Encounters:  12/08/20 191 lb 12.8 oz (87 kg)  08/12/20 187 lb 9.6 oz (85.1 kg)  05/20/20 181  lb 3.2 oz (82.2 kg)   Constitutional: overweight, in NAD Eyes: PERRLA, EOMI, no exophthalmos ENT: moist mucous membranes, no thyromegaly, no cervical lymphadenopathy Cardiovascular: RRR, No MRG Respiratory: CTA B Gastrointestinal: abdomen soft, NT, ND, BS+ Musculoskeletal: no deformities, strength intact in all 4 Skin: moist, warm, no rashes Neurological: no tremor with outstretched hands, DTR normal in all 4  ASSESSMENT: 1. Hypothyroidism -Diagnosed as Hashimoto's hypothyroidism  2.  Vitamin D insufficiency  PLAN:  1. Patient with longstanding hypothyroidism, previously on levothyroxine therapy, with normal TFTs, but many nonspecific symptoms which could be related to thyroid, but also to menopause, depression, insomnia, deconditioning. We started Armour and 45 mg daily and then increase to 60 mg daily.  In 10/2020, we increase this further to 75 mg daily. - latest thyroid labs reviewed with pt >> normal: Lab Results  Component Value Date   TSH 3.32 10/30/2020   - she continues on Armour 75 mg daily - pt feels good on this dose, but still feels fatigued and cannot lose weight. - we discussed about taking the thyroid hormone every day, with water, >30 minutes before breakfast, separated by >4 hours from acid reflux medications, calcium, iron, multivitamins. Pt. is taking it correctly. - She continues on selenium 200 mcg daily in an effort to reduce her antibody levels. - At last check, in 03/2020, her antibody levels were lower - We will repeat her TFTs today and change the Armour dose accordingly.  We will also check her thyroid antibodies. - I will see her back in 1 year with labs in 6 months  2. vitamin D insufficiency -We checked a vitamin D level for her in the past due to generalized joint pains.  This returned slightly low. -We started on 2000 units vitamin D daily but had to increase the dose to 5000 units daily, which she continues now -Her vitamin D level was normal in  03/2020, at 45 -We will repeat the level today  Orders Placed This Encounter  Procedures  . TSH  . T4, free  . T3, free  . Vitamin D, 25-hydroxy  . Thyroid peroxidase antibody  . Thyroglobulin antibody  Needs refills for Armour.  Office Visit on 12/08/2020  Component Date Value Ref Range Status  . TSH 12/08/2020 0.78  0.35 - 4.50 uIU/mL Final  . Free T4 12/08/2020 0.37* 0.60 - 1.60 ng/dL Final   Comment: Specimens from patients who are undergoing biotin therapy and /or ingesting biotin supplements may contain high levels of biotin.  The higher biotin concentration in these specimens interferes with this Free T4 assay.  Specimens that contain high levels  of biotin may cause false high results for this Free T4 assay.  Please interpret results in light of the total clinical presentation of the patient.    . T3, Free 12/08/2020 2.9  2.3 - 4.2 pg/mL Final  . Vit D, 25-Hydroxy 12/08/2020 17.9* 30.0 - 100.0 ng/mL Final   Comment: Vitamin D deficiency has been defined by the [employer] practice guideline as a level of serum 25-OH vitamin D less than 20 ng/mL (1,2). The Endocrine Society went on to further define vitamin D insufficiency as a level between 21 and 29 ng/mL (2). 1. IOM (Institute of Medicine). 2010. Dietary reference    intakes for calcium and D. [place]: The    [employer]. 2. Holick MF, Binkley Harvey, Bischoff-Ferrari HA, et al.    Evaluation, treatment, and prevention of vitamin D    deficiency: an Endocrine Society clinical practice    guideline. JCEM. 2011 Jul; 96(7):1911-30.   Marland Kitchen Thyroperoxidase Ab SerPl-aCnc 12/08/2020 105* <9 IU/mL Final  . Thyroglobulin Ab 12/08/2020 <1  < or = 1 IU/mL Final   Vitamin D level is very low now.  She is probably inconsistent in taking her vitamin D supplement.  On the same dose, at last visit, vitamin D was normal, at 58.  Will advise her to take the 5000 units supplement every  day. TSH is normal.  TPO antibodies are slightly higher.  I will let her decide whether she wanted to stop selenium or not.  Philemon Kingdom, MD PhD [facility] Endocrinology

## 2020-12-09 ENCOUNTER — Other Ambulatory Visit: Payer: Self-pay | Admitting: Internal Medicine

## 2020-12-09 DIAGNOSIS — E559 Vitamin D deficiency, unspecified: Secondary | ICD-10-CM

## 2020-12-09 LAB — THYROID PEROXIDASE ANTIBODY: Thyroperoxidase Ab SerPl-aCnc: 105 IU/mL — ABNORMAL HIGH (ref <9)

## 2020-12-09 LAB — THYROGLOBULIN ANTIBODY: Thyroglobulin Ab: <1 IU/mL (ref <=1)

## 2020-12-09 LAB — VITAMIN D 25 HYDROXY (VIT D DEFICIENCY, FRACTURES): Vit D, 25-Hydroxy: 17.9 ng/mL — ABNORMAL LOW (ref 30.0–100.0)

## 2020-12-15 ENCOUNTER — Encounter: Payer: Self-pay | Admitting: Physician Assistant

## 2020-12-15 ENCOUNTER — Other Ambulatory Visit: Payer: Self-pay

## 2020-12-15 ENCOUNTER — Ambulatory Visit: Payer: 59 | Admitting: Physician Assistant

## 2020-12-15 VITALS — BP 118/78 | HR 81 | Temp 97.2°F | Ht 64.0 in | Wt 189.6 lb

## 2020-12-15 DIAGNOSIS — M79604 Pain in right leg: Secondary | ICD-10-CM | POA: Diagnosis not present

## 2020-12-15 DIAGNOSIS — R202 Paresthesia of skin: Secondary | ICD-10-CM | POA: Diagnosis not present

## 2020-12-15 DIAGNOSIS — M79605 Pain in left leg: Secondary | ICD-10-CM

## 2020-12-15 NOTE — Progress Notes (Signed)
Subjective:  Patient ID: [name], female    DOB: 15-Nov-1965  Age: 55 y.o. MRN: [id number]  Chief Complaint  Patient presents with  . Foot Pain    HPI  pt states for the past 8-9 months she has had bilateral leg and feet pain - states symptoms seeming to be getting worse She states at times both feet hurt, swell, tingle However she states she also has discoloration of feet and at times seem cold and turn dark Pt sees endocrinologist and had recent labwork but only thyroid panel and vitamin D Current Outpatient Medications on File Prior to Visit  Medication Sig Dispense Refill  . ARMOUR THYROID 15 MG tablet Take 1 tablet (15 mg total) by mouth daily. 90 tablet 3  . ARMOUR THYROID 60 MG tablet Take 1 tablet (60 mg total) by mouth daily before breakfast. 90 tablet 3  . Cholecalciferol (VITAMIN D-3) 125 MCG (5000 UT) TABS Take 1 tablet by mouth daily.    . clonazePAM (KLONOPIN) 0.5 MG tablet TAKE 1 TABLET BY MOUTH EVERYDAY AT BEDTIME 30 tablet 0  . escitalopram (LEXAPRO) 20 MG tablet TAKE 1 TABLET BY MOUTH EVERY DAY 90 tablet 0  . fexofenadine (ALLEGRA) 180 MG tablet Take 180 mg by mouth daily.    Marland Kitchen gabapentin (NEURONTIN) 100 MG capsule TAKE 1 CAPSULE BY MOUTH TWICE A DAY 60 capsule 2  . ibuprofen (ADVIL,MOTRIN) 200 MG tablet Take 400 mg by mouth daily as needed. For headache    . Multiple Vitamin (MULTIVITAMIN) tablet Take 1 tablet by mouth daily.    . polyethylene glycol (MIRALAX / GLYCOLAX) 17 g packet Take 17 g by mouth daily.    . SUMAtriptan (IMITREX) 100 MG tablet TAKE 1 TABLET BY MOUTH AT ONSET OF MIGRAINE, REPEAT DOSE IN 2 HRS IF NEEDED, MAX 2 TABS IN 24 HRS 9 tablet 2   No current facility-administered medications on file prior to visit.   Past Medical History:  Diagnosis Date  . Atrophy of thyroid (acquired)   . Autoimmune thyroiditis   . Chronic fatigue, unspecified   . Gastro-esophageal reflux disease without esophagitis   . Generalized anxiety disorder   .  Menopausal and female climacteric states   . Migraine without aura, not intractable, without status migrainosus   . Peripheral vascular disease ([place]) 1992   blood clot with broken leg, coumadin x 6 mos  . Vitamin D deficiency, unspecified    Past Surgical History:  Procedure Laterality Date  . Nova sure ablation  2012  . rotator cuff surgery  12/2014  . TONSILLECTOMY     as a child    Family History  Problem Relation Age of Onset  . Hypertension Mother   . Hyperlipidemia Mother   . Breast cancer Mother   . Melanoma Mother   . Migraines Mother   . Diabetes type II Mother   . Lung cancer Father   . Hypertension Sister   . Heart attack Maternal Grandmother   . Heart attack Maternal Grandfather   . Stroke Maternal Grandfather    Social History   Socioeconomic History  . Marital status: Married    Spouse name: Not on file  . Number of children: 2  . Years of education: Not on file  . Highest education level: Not on file  Occupational History  . Occupation: Hair stylist  Tobacco Use  . Smoking status: Never Smoker  . Smokeless tobacco: Never Used  Vaping Use  . Vaping Use: Never  used  Substance and Sexual Activity  . Alcohol use: Yes    Alcohol/week: 1.0 standard drink    Types: 1 Glasses of wine per week    Comment: Drinks on a regular basis and average quanity when she drinks is usually 1-2 drinks.  . Drug use: No  . Sexual activity: Yes    Birth control/protection: Surgical    Comment: husband had vasectomy  Other Topics Concern  . Not on file  Social History Narrative  . Not on file   Social Determinants of Health   Financial Resource Strain: Not on file  Food Insecurity: Not on file  Transportation Needs: Not on file  Physical Activity: Not on file  Stress: Not on file  Social Connections: Not on file    Review of Systems CONSTITUTIONAL: Negative for chills, fatigue, fever, unintentional weight gain and unintentional weight loss.  CARDIOVASCULAR:  Negative for chest pain, dizziness, palpitations and pedal edema.  RESPIRATORY: Negative for recent cough and dyspnea.  GASTROINTESTINAL: Negative for abdominal pain, acid reflux symptoms, constipation, diarrhea, nausea and vomiting.  MSK: see HPI INTEGUMENTARY: Negative for rash.    Objective:  BP 118/78 (BP Location: Left Arm, Patient Position: Sitting, Cuff Size: Normal)   Pulse 81   Temp (!) 97.2 F (36.2 C) (Temporal)   Ht 5\' 4"  (1.626 m)   Wt 189 lb 9.6 oz (86 kg)   SpO2 97%   BMI 32.54 kg/m   BP/Weight 12/15/2020 12/08/2020 35/00/9381  Systolic BP 829 937 169  Diastolic BP 78 82 76  Wt. (Lbs) 189.6 191.8 187.6  BMI 32.54 32.92 32.2    Physical Exam PHYSICAL EXAM:   VS: BP 118/78 (BP Location: Left Arm, Patient Position: Sitting, Cuff Size: Normal)   Pulse 81   Temp (!) 97.2 F (36.2 C) (Temporal)   Ht 5\' 4"  (1.626 m)   Wt 189 lb 9.6 oz (86 kg)   SpO2 97%   BMI 32.54 kg/m   GEN: Well nourished, well developed, in no acute distress  Cardiac: RRR; no murmurs, rubs, or gallops,no edema -  Respiratory:  normal respiratory rate and pattern with no distress - normal breath sounds with no rales, rhonchi, wheezes or rubs GI: normal bowel sounds, no masses or tenderness MS: both legs noted no edema but multiple varicosities noted - some mild discoloration/darkening of some of toes - dorsalis pedis pulses decreased otherwise other pulses normal Skin: warm and dry, no rash  Psych: euthymic mood, appropriate affect and demeanor  Diabetic Foot Exam - Simple   No data filed      Lab Results  Component Value Date   WBC 7.2 05/20/2020   HGB 12.3 05/20/2020   HCT 36.9 05/20/2020   PLT 293 05/20/2020   GLUCOSE 84 11/29/2019   ALT 13 11/29/2019   AST 19 11/29/2019   NA 142 11/29/2019   K 4.1 11/29/2019   CL 103 11/29/2019   CREATININE 0.58 11/29/2019   BUN 12 11/29/2019   CO2 26 11/29/2019   TSH 0.78 12/08/2020      Assessment & Plan:   1. Paresthesias - CBC  with Differential/Platelet - Comprehensive metabolic panel - C78 and Folate Panel - US ARTERIAL SEG MULTIPLE LE (ABI, SEGMENTAL PRESSURES, PVR'S)  2. Bilateral leg pain - CBC with Differential/Platelet - Comprehensive metabolic panel - L38 and Folate Panel - US ARTERIAL SEG MULTIPLE LE (ABI, SEGMENTAL PRESSURES, PVR'S)    No orders of the defined types were placed in this encounter.  Orders Placed This Encounter  Procedures  . US ARTERIAL SEG MULTIPLE LE (ABI, SEGMENTAL PRESSURES, PVR'S)  . CBC with Differential/Platelet  . Comprehensive metabolic panel  . B12 and Folate Panel     Follow-up: Return in about 3 months (around 03/17/2021) for fasting physical.  An After Visit Summary was printed and given to the patient.  Yetta Flock [facility] [phone]

## 2020-12-16 LAB — CBC WITH DIFFERENTIAL/PLATELET
Basophils Absolute: 0.1 10*3/uL (ref 0.0–0.2)
Basos: 1 %
EOS (ABSOLUTE): 0.2 10*3/uL (ref 0.0–0.4)
Eos: 2 %
Hematocrit: 38.5 % (ref 34.0–46.6)
Hemoglobin: 12.6 g/dL (ref 11.1–15.9)
Immature Grans (Abs): 0 10*3/uL (ref 0.0–0.1)
Immature Granulocytes: 0 %
Lymphocytes Absolute: 2.4 10*3/uL (ref 0.7–3.1)
Lymphs: 31 %
MCH: 30.3 pg (ref 26.6–33.0)
MCHC: 32.7 g/dL (ref 31.5–35.7)
MCV: 93 fL (ref 79–97)
Monocytes Absolute: 0.6 10*3/uL (ref 0.1–0.9)
Monocytes: 8 %
Neutrophils Absolute: 4.5 10*3/uL (ref 1.4–7.0)
Neutrophils: 58 %
Platelets: 315 10*3/uL (ref 150–450)
RBC: 4.16 x10E6/uL (ref 3.77–5.28)
RDW: 12.9 % (ref 11.7–15.4)
WBC: 7.8 10*3/uL (ref 3.4–10.8)

## 2020-12-16 LAB — COMPREHENSIVE METABOLIC PANEL
ALT: 18 IU/L (ref 0–32)
AST: 20 IU/L (ref 0–40)
Albumin/Globulin Ratio: 1.9 (ref 1.2–2.2)
Albumin: 4.5 g/dL (ref 3.8–4.9)
Alkaline Phosphatase: 86 IU/L (ref 44–121)
BUN/Creatinine Ratio: 26 — ABNORMAL HIGH (ref 9–23)
BUN: 15 mg/dL (ref 6–24)
Bilirubin Total: 0.4 mg/dL (ref 0.0–1.2)
CO2: 25 mmol/L (ref 20–29)
Calcium: 9.5 mg/dL (ref 8.7–10.2)
Chloride: 102 mmol/L (ref 96–106)
Creatinine, Ser: 0.57 mg/dL (ref 0.57–1.00)
Globulin, Total: 2.4 g/dL (ref 1.5–4.5)
Glucose: 85 mg/dL (ref 65–99)
Potassium: 4.1 mmol/L (ref 3.5–5.2)
Sodium: 140 mmol/L (ref 134–144)
Total Protein: 6.9 g/dL (ref 6.0–8.5)
eGFR: 108 mL/min/{1.73_m2} (ref >59)

## 2020-12-16 LAB — B12 AND FOLATE PANEL
Folate: 12.3 ng/mL (ref >3.0)
Vitamin B-12: 506 pg/mL (ref 232–1245)

## 2020-12-17 ENCOUNTER — Other Ambulatory Visit: Payer: Self-pay | Admitting: Family Medicine

## 2020-12-24 ENCOUNTER — Ambulatory Visit
Admission: RE | Admit: 2020-12-24 | Discharge: 2020-12-24 | Disposition: A | Discharge status: Home / Self Care | Payer: 59 | Source: Ambulatory Visit | Attending: Physician Assistant | Admitting: Physician Assistant

## 2020-12-30 ENCOUNTER — Encounter: Payer: Self-pay | Admitting: Physician Assistant

## 2021-01-06 ENCOUNTER — Ambulatory Visit (INDEPENDENT_AMBULATORY_CARE_PROVIDER_SITE_OTHER): Payer: 59 | Admitting: Podiatry

## 2021-01-06 ENCOUNTER — Other Ambulatory Visit: Payer: Self-pay

## 2021-01-06 ENCOUNTER — Encounter: Payer: Self-pay | Admitting: Podiatry

## 2021-01-06 ENCOUNTER — Ambulatory Visit (INDEPENDENT_AMBULATORY_CARE_PROVIDER_SITE_OTHER): Payer: 59

## 2021-01-06 DIAGNOSIS — M722 Plantar fascial fibromatosis: Secondary | ICD-10-CM

## 2021-01-06 MED ORDER — METHYLPREDNISOLONE 4 MG PO TBPK: ORAL_TABLET | ORAL | 0 refills | Status: DC

## 2021-01-06 MED ORDER — TRIAMCINOLONE ACETONIDE 40 MG/ML IJ SUSP
20.0000 mg | Freq: Once | INTRAMUSCULAR | Status: AC
  Administered 2021-01-06: 20 mg

## 2021-01-06 MED ORDER — MELOXICAM 15 MG PO TABS: 15.0000 mg | ORAL_TABLET | Freq: Every day | ORAL | 3 refills | Status: DC

## 2021-01-06 MED ORDER — GABAPENTIN 300 MG PO CAPS
300.0000 mg | ORAL_CAPSULE | Freq: Every day | ORAL | 1 refills | Status: DC
Start: 2021-01-06 — End: 2021-04-02

## 2021-01-06 NOTE — Patient Instructions (Signed)

## 2021-01-07 NOTE — Progress Notes (Signed)
Subjective:  Patient ID: [name], female    DOB: June 26, 1966,  MRN: [id number] HPI Chief Complaint  Patient presents with  . Foot Pain    Plantar forefoot, arch, heels, some posterior heel bilateral (L>R) - aching, stinging, burning, numbness in the big toe right, ongoing for a few months, PCP checked for diabetes and circulation (all negative), tried muscle rubs, OTC NSAIDS, Tylenol, ice massage  . New Patient (Initial Visit)    55 y.o. female presents with the above complaint.   ROS: Denies fever chills nausea vomiting muscle aches pains calf pain back pain chest pain shortness of breath.  Past Medical History:  Diagnosis Date  . Atrophy of thyroid (acquired)   . Autoimmune thyroiditis   . Chronic fatigue, unspecified   . Gastro-esophageal reflux disease without esophagitis   . Generalized anxiety disorder   . Menopausal and female climacteric states   . Migraine without aura, not intractable, without status migrainosus   . Peripheral vascular disease ([place]) 1992   blood clot with broken leg, coumadin x 6 mos  . Vitamin D deficiency, unspecified    Past Surgical History:  Procedure Laterality Date  . Nova sure ablation  2012  . rotator cuff surgery  12/2014  . TONSILLECTOMY     as a child    Current Outpatient Medications:  .  gabapentin (NEURONTIN) 100 MG capsule, Take 100 mg by mouth in the morning., Disp: , Rfl:  .  gabapentin (NEURONTIN) 300 MG capsule, Take 1 capsule (300 mg total) by mouth at bedtime., Disp: 90 capsule, Rfl: 1 .  meloxicam (MOBIC) 15 MG tablet, Take 1 tablet (15 mg total) by mouth daily., Disp: 30 tablet, Rfl: 3 .  methylPREDNISolone (MEDROL DOSEPAK) 4 MG TBPK tablet, 6 day dose pack - take as directed, Disp: 21 tablet, Rfl: 0 .  ARMOUR THYROID 15 MG tablet, Take 1 tablet (15 mg total) by mouth daily., Disp: 90 tablet, Rfl: 3 .  ARMOUR THYROID 60 MG tablet, Take 1 tablet (60 mg total) by mouth daily before breakfast., Disp: 90 tablet, Rfl:  3 .  Cholecalciferol (VITAMIN D-3) 125 MCG (5000 UT) TABS, Take 1 tablet by mouth daily., Disp: , Rfl:  .  clonazePAM (KLONOPIN) 0.5 MG tablet, TAKE 1 TABLET BY MOUTH EVERYDAY AT BEDTIME, Disp: 30 tablet, Rfl: 0 .  escitalopram (LEXAPRO) 20 MG tablet, TAKE 1 TABLET BY MOUTH EVERY DAY, Disp: 90 tablet, Rfl: 0 .  fexofenadine (ALLEGRA) 180 MG tablet, Take 180 mg by mouth daily., Disp: , Rfl:  .  ibuprofen (ADVIL,MOTRIN) 200 MG tablet, Take 400 mg by mouth daily as needed. For headache, Disp: , Rfl:  .  Multiple Vitamin (MULTIVITAMIN) tablet, Take 1 tablet by mouth daily., Disp: , Rfl:  .  polyethylene glycol (MIRALAX / GLYCOLAX) 17 g packet, Take 17 g by mouth daily., Disp: , Rfl:  .  SUMAtriptan (IMITREX) 100 MG tablet, TAKE 1 TABLET BY MOUTH AT ONSET OF MIGRAINE, REPEAT DOSE IN 2 HRS IF NEEDED, Aydyn Testerman 2 TABS IN 24 HRS, Disp: 9 tablet, Rfl: 2  No Known Allergies Review of Systems Objective:  There were no vitals filed for this visit.  General: Well developed, nourished, in no acute distress, alert and oriented x3   Dermatological: Skin is warm, dry and supple bilateral. Nails x 10 are well maintained; remaining integument appears unremarkable at this time. There are no open sores, no preulcerative lesions, no rash or signs of infection present.  Vascular: Dorsalis Pedis artery and  Posterior Tibial artery pedal pulses are 2/4 bilateral with immedate capillary fill time. Pedal hair growth present. No varicosities and no lower extremity edema present bilateral.   Neruologic: Grossly intact via light touch bilateral. Vibratory intact via tuning fork bilateral. Protective threshold with Semmes Wienstein monofilament intact to all pedal sites bilateral. Patellar and Achilles deep tendon reflexes 2+ bilateral. No Babinski or clonus noted bilateral.   Musculoskeletal: No gross boney pedal deformities bilateral. No pain, crepitus, or limitation noted with foot and ankle range of motion bilateral. Muscular  strength 5/5 in all groups tested bilateral.  Pain on palpation medial calcaneal tubercles bilateral some tenderness on palpation of posterior aspect of the bilateral heels.  Gait: Unassisted, Nonantalgic.    Radiographs:  Radiographs taken today demonstrate soft tissue increase in density plantar fascial cannula insertion site left greater than right she also has a small plantar distally oriented calcaneal heel spur left proximally oriented posterior calcaneal heel spur left no thickening of the Achilles.  No acute findings of soft tissue or osseous structures.  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  Left greater than right  Plan: Discussed etiology pathology conservative versus surgical therapies at this point we injected the bilateral heels today with 20 mg Kenalog 5 mg Marcaine.  She is tolerated the procedure well started her on methylprednisolone to be followed by meloxicam and dispensed to plantar fascial braces.  We increased her gabapentin at nighttime to 300 mg 1 p.o. nightly she will continue the daytime dose of 100 mg for her headaches.  We discussed appropriate shoe gear stretching exercise ice therapy and shoe gear modifications I will follow-up with her in 1 month     Iktan Aikman T. [place], [place]

## 2021-01-11 ENCOUNTER — Other Ambulatory Visit: Payer: Self-pay | Admitting: Physician Assistant

## 2021-01-31 ENCOUNTER — Other Ambulatory Visit: Payer: Self-pay | Admitting: Physician Assistant

## 2021-02-10 ENCOUNTER — Ambulatory Visit (INDEPENDENT_AMBULATORY_CARE_PROVIDER_SITE_OTHER): Payer: 59 | Admitting: Podiatry

## 2021-02-10 ENCOUNTER — Other Ambulatory Visit: Payer: Self-pay

## 2021-02-10 DIAGNOSIS — G5793 Unspecified mononeuropathy of bilateral lower limbs: Secondary | ICD-10-CM | POA: Diagnosis not present

## 2021-02-10 DIAGNOSIS — G5753 Tarsal tunnel syndrome, bilateral lower limbs: Secondary | ICD-10-CM

## 2021-02-11 NOTE — Progress Notes (Signed)
She presents today for follow-up of her Planter fasciitis.  States that she is been using the braces taking the meloxicam doing the stretches.  She took the prednisone but none of this has relieved all of her symptoms.  She states that the plantar heel feels much better but she still having radiating pain distal to her toes and shooting up her legs along the medial aspect of the ankle bilaterally she does state that the gabapentin has helped at night by increasing it to 300 mg.  Objective: Vital signs are stable alert and oriented x3.  Pulses are palpable.  She has very minimal plantar fascial pain at this point.  Though she does have significant tarsal tunnel type symptoms she has a positive Tinel's sign bilaterally.  Assessment resolving fasciitis probable tarsal tunnel syndrome.  Plan: At this point we will refer her to neurology for evaluation and treatment tarsal tunnel versus neuropathy evaluate and treat.  If she does have tarsal tunnel and needs decompression we would like to have her back to our office.

## 2021-02-19 ENCOUNTER — Encounter: Payer: Self-pay | Admitting: Neurology

## 2021-03-16 ENCOUNTER — Other Ambulatory Visit: Payer: Self-pay | Admitting: Physician Assistant

## 2021-03-16 ENCOUNTER — Other Ambulatory Visit: Payer: Self-pay | Admitting: Family Medicine

## 2021-03-24 ENCOUNTER — Encounter: Payer: Self-pay | Admitting: Physician Assistant

## 2021-03-24 ENCOUNTER — Other Ambulatory Visit: Payer: Self-pay

## 2021-03-24 ENCOUNTER — Ambulatory Visit (INDEPENDENT_AMBULATORY_CARE_PROVIDER_SITE_OTHER): Payer: 59 | Admitting: Physician Assistant

## 2021-03-24 VITALS — BP 118/72 | HR 76 | Temp 95.6°F | Ht 66.0 in | Wt 191.0 lb

## 2021-03-24 DIAGNOSIS — E039 Hypothyroidism, unspecified: Secondary | ICD-10-CM | POA: Diagnosis not present

## 2021-03-24 DIAGNOSIS — E559 Vitamin D deficiency, unspecified: Secondary | ICD-10-CM

## 2021-03-24 DIAGNOSIS — Z0000 Encounter for general adult medical examination without abnormal findings: Secondary | ICD-10-CM | POA: Diagnosis not present

## 2021-03-24 DIAGNOSIS — Z23 Encounter for immunization: Secondary | ICD-10-CM

## 2021-03-24 NOTE — Progress Notes (Signed)
Subjective:  Patient ID: [name], female    DOB: [birth year]  Age: 55 y.o. MRN: [id number]  Chief Complaint  Patient presents with   CPE    HPI Well Adult Physical: Patient here for a comprehensive physical exam.The patient reports no problems Do you take any herbs or supplements that were not prescribed by a doctor? no Are you taking calcium supplements? no Are you taking aspirin daily? no  Encounter for general adult medical examination without abnormal findings  Physical ("At Risk" items are starred): Patient's last physical exam was 1 year ago .  Smoking: Life-long non-smoker ;  Physical Activity: does not exercise Alcohol/Drug Use: occasional ETOH; No illicit drug use ;  Patient is not afflicted from Stress Incontinence and Urge Incontinence  Safety: reviewed. Patient wears a seat belt, has smoke detectors, has carbon monoxide detectors, practices appropriate gun safety, and wears sunscreen with extended sun exposure. Dental Care: biannual cleanings, brushes and flosses daily. Ophthalmology/Optometry: Annual visit.  Hearing loss: none Vision impairments:wears glasses  Sees GYN for pap - last one last year - will have them schedule for mammogram and will inquire about dexa scan  Flowsheet Row Office Visit from 03/24/2021 in [facility]  PHQ-2 Total Score 2               Social Hx   Social History   Socioeconomic History   Marital status: Married    Spouse name: Not on file   Number of children: 2   Years of education: Not on file   Highest education level: Not on file  Occupational History   Occupation: Hair stylist  Tobacco Use   Smoking status: Never   Smokeless tobacco: Never  Vaping Use   Vaping Use: Never used  Substance and Sexual Activity   Alcohol use: Yes    Alcohol/week: 1.0 standard drink    Types: 1 Glasses of wine per week    Comment: Drinks on a regular basis and average quanity when she drinks is usually 1-2 drinks.   Drug  use: No   Sexual activity: Yes    Birth control/protection: Surgical    Comment: husband had vasectomy  Other Topics Concern   Not on file  Social History Narrative   Not on file   Social Determinants of Health   Financial Resource Strain: Not on file  Food Insecurity: Not on file  Transportation Needs: Not on file  Physical Activity: Not on file  Stress: Not on file  Social Connections: Not on file   Past Medical History:  Diagnosis Date   Atrophy of thyroid (acquired)    Autoimmune thyroiditis    Chronic fatigue, unspecified    Gastro-esophageal reflux disease without esophagitis    Generalized anxiety disorder    Menopausal and female climacteric states    Migraine without aura, not intractable, without status migrainosus    Peripheral vascular disease ([place]) 1992   blood clot with broken leg, coumadin x 6 mos   Vitamin D deficiency, unspecified    Past Surgical History:  Procedure Laterality Date   Nova sure ablation  2012   rotator cuff surgery  12/2014   TONSILLECTOMY     as a child    Family History  Problem Relation Age of Onset   Hypertension Mother    Hyperlipidemia Mother    Breast cancer Mother    Melanoma Mother    Migraines Mother    Diabetes type II Mother    Lung cancer  Father    Hypertension Sister    Heart attack Maternal Grandmother    Heart attack Maternal Grandfather    Stroke Maternal Grandfather     Review of Systems CONSTITUTIONAL: Negative for chills, fatigue, fever, unintentional weight gain and unintentional weight loss.  E/N/T: Negative for ear pain, nasal congestion and sore throat.  CARDIOVASCULAR: Negative for chest pain, dizziness, palpitations and pedal edema.  RESPIRATORY: Negative for recent cough and dyspnea.  GASTROINTESTINAL: Negative for abdominal pain, acid reflux symptoms, constipation, diarrhea, nausea and vomiting.  MSK: Negative for arthralgias and myalgias.  INTEGUMENTARY: Negative for rash.  NEUROLOGICAL:  Negative for dizziness and headaches.  PSYCHIATRIC: Negative for sleep disturbance and to question depression screen.  Negative for depression, negative for anhedonia.       Objective:  BP 118/72   Pulse 76   Temp (!) 95.6 F (35.3 C)   Ht '5\' 6"'$  (1.676 m)   Wt 191 lb (86.6 kg)   SpO2 99%   BMI 30.83 kg/m   BP/Weight 03/24/2021 12/15/2020 [id number]  Systolic BP 123456 123456 0000000  Diastolic BP 72 78 82  Wt. (Lbs) 191 189.6 191.8  BMI 30.83 32.54 32.92    Physical Exam PHYSICAL EXAM:   VS: BP 118/72   Pulse 76   Temp (!) 95.6 F (35.3 C)   Ht '5\' 6"'$  (1.676 m)   Wt 191 lb (86.6 kg)   SpO2 99%   BMI 30.83 kg/m   GEN: Well nourished, well developed, in no acute distress  HEENT: normal external ears and nose - normal external auditory canals and TMS - hearing grossly normal - Lips, Teeth and Gums - normal  Oropharynx - normal mucosa, palate, and posterior pharynx Cardiac: RRR; no murmurs, rubs, or gallops,no edema - no significant varicosities Respiratory:  normal respiratory rate and pattern with no distress - normal breath sounds with no rales, rhonchi, wheezes or rubs GI: normal bowel sounds, no masses or tenderness MS: no deformity or atrophy  Skin: warm and dry, no rash  Neuro:  Alert and Oriented x 3,  CN II-Xii grossly intact Psych: euthymic mood, appropriate affect and demeanor  Lab Results  Component Value Date   WBC 7.8 12/15/2020   HGB 12.6 12/15/2020   HCT 38.5 12/15/2020   PLT 315 12/15/2020   GLUCOSE 85 12/15/2020   ALT 18 12/15/2020   AST 20 12/15/2020   NA 140 12/15/2020   K 4.1 12/15/2020   CL 102 12/15/2020   CREATININE 0.57 12/15/2020   BUN 15 12/15/2020   CO2 25 12/15/2020   TSH 0.78 12/08/2020      Assessment & Plan:  1. Annual physical exam - Tdap vaccine greater than or equal to 7yo IM - CBC with Differential/Platelet - Comprehensive metabolic panel - Thyroid Panel With TSH - Lipid panel - VITAMIN D 25 Hydroxy (Vit-D Deficiency,  Fractures)  2. Acquired hypothyroidism - Thyroid Panel With TSH  3. Vitamin D insufficiency - VITAMIN D 25 Hydroxy (Vit-D Deficiency, Fractures)    Body mass index is 30.83 kg/m.   These are the goals we discussed:  Goals   None      This is a list of the screening recommended for you and due dates:  Health Maintenance  Topic Date Due   Tetanus Vaccine  Never done   COVID-19 Vaccine (3 - Booster for Moderna series) 04/23/2020   Flu Shot  03/16/2021   Zoster (Shingles) Vaccine (1 of 2) 06/24/2021*   Mammogram  04/01/2021  Colon Cancer Screening  01/07/2022   Pap Smear  04/01/2022   Pneumococcal Vaccination  Aged Out   HPV Vaccine  Aged Out   Hepatitis C Screening: USPSTF Recommendation to screen - Ages 18-79 yo.  Discontinued   HIV Screening  Discontinued  *Topic was postponed. The date shown is not the original due date.     AN INDIVIDUALIZED CARE PLAN: was established or reinforced today.   SELF MANAGEMENT: The patient and I together assessed ways to personally work towards obtaining the recommended goals  Support needs The patient and/or family needs were assessed and services were offered if appropriate.  No orders of the defined types were placed in this encounter.   Follow-up: Return in about 6 months (around 09/24/2021) for chronic fasting follow up.  An After Visit Summary was printed and given to the patient.  Yetta Flock [facility] [phone]

## 2021-03-25 ENCOUNTER — Other Ambulatory Visit: Payer: Self-pay | Admitting: Physician Assistant

## 2021-03-25 DIAGNOSIS — E559 Vitamin D deficiency, unspecified: Secondary | ICD-10-CM

## 2021-03-25 LAB — LIPID PANEL
Chol/HDL Ratio: 4.3 ratio (ref 0.0–4.4)
Cholesterol, Total: 211 mg/dL — ABNORMAL HIGH (ref 100–199)
HDL: 49 mg/dL (ref >39)
LDL Chol Calc (NIH): 134 mg/dL — ABNORMAL HIGH (ref 0–99)
Triglycerides: 158 mg/dL — ABNORMAL HIGH (ref 0–149)
VLDL Cholesterol Cal: 28 mg/dL (ref 5–40)

## 2021-03-25 LAB — CBC WITH DIFFERENTIAL/PLATELET
Basophils Absolute: 0.1 10*3/uL (ref 0.0–0.2)
Basos: 1 %
EOS (ABSOLUTE): 0.2 10*3/uL (ref 0.0–0.4)
Eos: 3 %
Hematocrit: 35.7 % (ref 34.0–46.6)
Hemoglobin: 12.2 g/dL (ref 11.1–15.9)
Immature Grans (Abs): 0.1 10*3/uL (ref 0.0–0.1)
Immature Granulocytes: 1 %
Lymphocytes Absolute: 2.1 10*3/uL (ref 0.7–3.1)
Lymphs: 29 %
MCH: 31.4 pg (ref 26.6–33.0)
MCHC: 34.2 g/dL (ref 31.5–35.7)
MCV: 92 fL (ref 79–97)
Monocytes Absolute: 0.6 10*3/uL (ref 0.1–0.9)
Monocytes: 8 %
Neutrophils Absolute: 4.4 10*3/uL (ref 1.4–7.0)
Neutrophils: 58 %
Platelets: 267 10*3/uL (ref 150–450)
RBC: 3.88 x10E6/uL (ref 3.77–5.28)
RDW: 13.1 % (ref 11.7–15.4)
WBC: 7.4 10*3/uL (ref 3.4–10.8)

## 2021-03-25 LAB — COMPREHENSIVE METABOLIC PANEL
ALT: 14 IU/L (ref 0–32)
AST: 16 IU/L (ref 0–40)
Albumin/Globulin Ratio: 2 (ref 1.2–2.2)
Albumin: 4.4 g/dL (ref 3.8–4.9)
Alkaline Phosphatase: 74 IU/L (ref 44–121)
BUN/Creatinine Ratio: 21 (ref 9–23)
BUN: 13 mg/dL (ref 6–24)
Bilirubin Total: 0.4 mg/dL (ref 0.0–1.2)
CO2: 25 mmol/L (ref 20–29)
Calcium: 9.4 mg/dL (ref 8.7–10.2)
Chloride: 105 mmol/L (ref 96–106)
Creatinine, Ser: 0.62 mg/dL (ref 0.57–1.00)
Globulin, Total: 2.2 g/dL (ref 1.5–4.5)
Glucose: 97 mg/dL (ref 65–99)
Potassium: 4.6 mmol/L (ref 3.5–5.2)
Sodium: 141 mmol/L (ref 134–144)
Total Protein: 6.6 g/dL (ref 6.0–8.5)
eGFR: 106 mL/min/{1.73_m2} (ref >59)

## 2021-03-25 LAB — CARDIOVASCULAR RISK ASSESSMENT

## 2021-03-25 LAB — THYROID PANEL WITH TSH
Free Thyroxine Index: 1.2 (ref 1.2–4.9)
T3 Uptake Ratio: 25 % (ref 24–39)
T4, Total: 4.7 ug/dL (ref 4.5–12.0)
TSH: 0.256 u[IU]/mL — ABNORMAL LOW (ref 0.450–4.500)

## 2021-03-25 LAB — VITAMIN D 25 HYDROXY (VIT D DEFICIENCY, FRACTURES): Vit D, 25-Hydroxy: 28.4 ng/mL — ABNORMAL LOW (ref 30.0–100.0)

## 2021-03-25 MED ORDER — ROSUVASTATIN CALCIUM 5 MG PO TABS: 5.0000 mg | ORAL_TABLET | Freq: Every day | ORAL | 2 refills | Status: DC

## 2021-03-25 MED ORDER — VITAMIN D (ERGOCALCIFEROL) 1.25 MG (50000 UNIT) PO CAPS: 50000.0000 [IU] | ORAL_CAPSULE | ORAL | 3 refills | Status: DC

## 2021-03-30 ENCOUNTER — Other Ambulatory Visit: Payer: Self-pay | Admitting: Internal Medicine

## 2021-03-30 ENCOUNTER — Encounter: Payer: Self-pay | Admitting: Internal Medicine

## 2021-03-30 DIAGNOSIS — E038 Other specified hypothyroidism: Secondary | ICD-10-CM

## 2021-04-02 ENCOUNTER — Encounter: Payer: Self-pay | Admitting: Neurology

## 2021-04-02 ENCOUNTER — Other Ambulatory Visit: Payer: Self-pay

## 2021-04-02 ENCOUNTER — Ambulatory Visit (INDEPENDENT_AMBULATORY_CARE_PROVIDER_SITE_OTHER): Payer: 59 | Admitting: Neurology

## 2021-04-02 VITALS — BP 127/70 | HR 70 | Ht 66.0 in | Wt 191.0 lb

## 2021-04-02 DIAGNOSIS — R202 Paresthesia of skin: Secondary | ICD-10-CM | POA: Diagnosis not present

## 2021-04-02 DIAGNOSIS — M79671 Pain in right foot: Secondary | ICD-10-CM

## 2021-04-02 DIAGNOSIS — G8929 Other chronic pain: Secondary | ICD-10-CM

## 2021-04-02 DIAGNOSIS — R292 Abnormal reflex: Secondary | ICD-10-CM | POA: Diagnosis not present

## 2021-04-02 DIAGNOSIS — M79672 Pain in left foot: Secondary | ICD-10-CM

## 2021-04-02 MED ORDER — GABAPENTIN 300 MG PO CAPS: 300.0000 mg | ORAL_CAPSULE | Freq: Two times a day (BID) | ORAL | 3 refills | Status: DC

## 2021-04-02 NOTE — Patient Instructions (Addendum)
MRI thoracic spine without contrast  Nerve testing of the legs.  Do not apply lotion or moisturizer to your legs on the day of testing  Increase gabapentin to '300mg'$  twice daily

## 2021-04-02 NOTE — Progress Notes (Signed)
[facility] Clinic Note - Initial Visit   Date: 04/02/21  [name] MRN: [id number] DOB: Dec 10, 1965   Dear Kaylee Duncans, PA-C:  Thank you for your kind referral of [name] for consultation of bilateral feet pain. Although her history is well known to you, please allow [name] to reiterate it for the purpose of our medical record. The patient was accompanied to the clinic by self.    History of Present Illness: [name] is a 55 y.o. right-handed female with autoimmune thyroiditis, GERD, hyperlipidemia, migraine, history of bilateral leg DVT, and migraines presenting for evaluation of feet pain.   Starting around February 2022, she began having burning and needle sensation over the soles and top of the feet.  Pain is constant. She has tried ice and massage which eases her pain temporarily. She received steroid injection to the soles of her feet for plantar fasciitis, but did not provide any relief.  She takes gabapentin '300mg'$  at bedtime, which helps her sleep.  She works as a Emergency planning/management officer and has hard time trying to stand on her feet all day. She denies weakness or imbalance. She has fallen a few times this year.  No history of diabetes, alcohol use, or family history of neuropathy.  She endorses chronic low back pain and had MRI lumbar spine earlier this year which was essentially normal and showed a renal cyst.    Out-side paper records, electronic medical record, and images have been reviewed where available and summarized as:  MRI lumbar spine 08/19/2020:  No lumbar degenerative changes. Cystic lesion in the left kidney.  Lab Results  Component Value Date   [id number] 12/15/2020   Lab Results  Component Value Date   TSH 0.256 (L) 03/24/2021   Lab Results  Component Value Date   ESRSEDRATE 2 05/20/2020    Past Medical History:  Diagnosis Date   Atrophy of thyroid (acquired)    Autoimmune thyroiditis    Chronic fatigue, unspecified     Gastro-esophageal reflux disease without esophagitis    Generalized anxiety disorder    Menopausal and female climacteric states    Migraine without aura, not intractable, without status migrainosus    Peripheral vascular disease ([place]) 1992   blood clot with broken leg, coumadin x 6 mos   Vitamin D deficiency, unspecified     Past Surgical History:  Procedure Laterality Date   Nova sure ablation  2012   rotator cuff surgery  12/2014   TONSILLECTOMY     as a child     Medications:  Outpatient Encounter Medications as of 04/02/2021  Medication Sig   ARMOUR THYROID 15 MG tablet Take 1 tablet (15 mg total) by mouth daily.   ARMOUR THYROID 60 MG tablet Take 1 tablet (60 mg total) by mouth daily before breakfast.   clonazePAM (KLONOPIN) 0.5 MG tablet TAKE 1 TABLET BY MOUTH EVERYDAY AT BEDTIME   escitalopram (LEXAPRO) 20 MG tablet TAKE 1 TABLET BY MOUTH EVERY DAY   fexofenadine (ALLEGRA) 180 MG tablet Take 180 mg by mouth daily.   gabapentin (NEURONTIN) 100 MG capsule Take 100 mg by mouth in the morning.   gabapentin (NEURONTIN) 300 MG capsule Take 1 capsule (300 mg total) by mouth at bedtime.   ibuprofen (ADVIL,MOTRIN) 200 MG tablet Take 400 mg by mouth daily as needed. For headache   meloxicam (MOBIC) 15 MG tablet Take 1 tablet (15 mg total) by mouth daily.   Multiple Vitamin (MULTIVITAMIN) tablet Take  1 tablet by mouth daily.   polyethylene glycol (MIRALAX / GLYCOLAX) 17 g packet Take 17 g by mouth daily.   rosuvastatin (CRESTOR) 5 MG tablet Take 1 tablet (5 mg total) by mouth daily.   SUMAtriptan (IMITREX) 100 MG tablet TAKE 1 TABLET BY MOUTH AT ONSET OF MIGRAINE, REPEAT DOSE IN 2 HRS IF NEEDED, MAX 2 TABS IN 24 HRS   Vitamin D, Ergocalciferol, (DRISDOL) 1.25 MG (50000 UNIT) CAPS capsule Take 1 capsule (50,000 Units total) by mouth every 7 (seven) days.   No facility-administered encounter medications on file as of 04/02/2021.    Allergies: No Known Allergies  Family  History: Family History  Problem Relation Age of Onset   Hypertension Mother    Hyperlipidemia Mother    Breast cancer Mother    Melanoma Mother    Migraines Mother    Diabetes type II Mother    Lung cancer Father    Hypertension Sister    Heart attack Maternal Grandmother    Heart attack Maternal Grandfather    Stroke Maternal Grandfather     Social History: Social History   Tobacco Use   Smoking status: Never   Smokeless tobacco: Never  Vaping Use   Vaping Use: Never used  Substance Use Topics   Alcohol use: Yes    Alcohol/week: 1.0 standard drink    Types: 1 Glasses of wine per week   Drug use: Never   Social History   Social History Narrative   Right-handedness   One-story home    Vital Signs:  BP 127/70 (BP Location: Left Arm, Patient Position: Sitting, Cuff Size: Normal)   Pulse 70   Ht '5\' 6"'$  (1.676 m)   Wt 191 lb (86.6 kg)   SpO2 98%   BMI 30.83 kg/m    Neurological Exam: MENTAL STATUS including orientation to time, place, person, recent and remote memory, attention span and concentration, language, and fund of knowledge is normal.  Speech is not dysarthric.  CRANIAL NERVES: II:  No visual field defects.   III-IV-VI: Pupils equal round and reactive to light.  Normal conjugate, extra-ocular eye movements in all directions of gaze.  No nystagmus.  No ptosis.   V:  Normal facial sensation.    VII:  Normal facial symmetry and movements.   VIII:  Normal hearing and vestibular function.   IX-X:  Normal palatal movement.   XI:  Normal shoulder shrug and head rotation.   XII:  Normal tongue strength and range of motion, no deviation or fasciculation.  MOTOR:  No atrophy, fasciculations or abnormal movements.  No pronator drift.   Upper Extremity:  Right  Left  Deltoid  5/5   5/5   Biceps  5/5   5/5   Triceps  5/5   5/5   Infraspinatus 5/5  5/5  Medial pectoralis 5/5  5/5  Wrist extensors  5/5   5/5   Wrist flexors  5/5   5/5   Finger extensors  5/5    5/5   Finger flexors  5/5   5/5   Dorsal interossei  5/5   5/5   Abductor pollicis  5/5   5/5   Tone (Ashworth scale)  0  0   Lower Extremity:  Right  Left  Hip flexors  5/5   5/5   Hip extensors  5/5   5/5   Adductor 5/5  5/5  Abductor 5/5  5/5  Knee flexors  5/5   5/5   Knee extensors  5/5   5/5   Dorsiflexors  5/5   5/5   Plantarflexors  5/5   5/5   Toe extensors  5/5   5/5   Toe flexors  5/5   5/5   Tone (Ashworth scale)  0  0   MSRs:  Right        Left                  brachioradialis 2+  2+  biceps 2+  2+  triceps 2+  2+  patellar 3+  3+  ankle jerk 3+  3+  Hoffman no  no  plantar response down  down   SENSORY:  Normal and symmetric perception of light touch, pinprick, vibration, and proprioception.  Romberg's sign absent.   COORDINATION/GAIT: Normal finger-to- nose-finger and heel-to-shin.  Intact rapid alternating movements bilaterally. Gait is slow, cautious, unassisted and stable.  Mild unsteadiness with tandem and stressed gait, but able to perform.   IMPRESSION: Bilateral feet paresthesias, ?neuropathy, less likely tarsal tunnel syndrome given involving over the dorsum of the feet which is innervated by the superficial peroneal nerve.  Risk factor for neuropathy includes thyroid disease. MRI lumbar spine from earlier this year does not show any lumbosacral nerve impingement at the L5-S1 nerve roots. - Nerve testing of the legs to characterize the nature of her symptoms - Increase gabapentin to '300mg'$  twice daily  2.   Hyperreflexia in the legs with associated low back pain.  MRI lumbar spine without evidence of spinal stenosis.  It's possible her reflexes are very brisk because of hyperthyroidism, but this is a diagnosis of exclusion and compressive pathology needs to be excluded first  - Check MRI thoracic spine wo contrast   Further recommendations pending results.   Thank you for allowing me to participate in patient's care.  If I can answer any additional  questions, I would be pleased to do so.    Sincerely,    Markevious Ehmke K. Posey Pronto, DO

## 2021-04-06 ENCOUNTER — Ambulatory Visit: Payer: 59 | Admitting: Internal Medicine

## 2021-04-15 ENCOUNTER — Other Ambulatory Visit: Payer: Self-pay | Admitting: Physician Assistant

## 2021-04-19 ENCOUNTER — Other Ambulatory Visit: Payer: Self-pay

## 2021-04-19 ENCOUNTER — Ambulatory Visit
Admission: RE | Admit: 2021-04-19 | Discharge: 2021-04-19 | Disposition: A | Discharge status: Home / Self Care | Payer: 59 | Source: Ambulatory Visit | Attending: Neurology | Admitting: Neurology

## 2021-04-19 DIAGNOSIS — R292 Abnormal reflex: Secondary | ICD-10-CM

## 2021-04-19 DIAGNOSIS — G8929 Other chronic pain: Secondary | ICD-10-CM

## 2021-04-19 DIAGNOSIS — R202 Paresthesia of skin: Secondary | ICD-10-CM

## 2021-04-20 ENCOUNTER — Other Ambulatory Visit: Payer: Self-pay | Admitting: Physician Assistant

## 2021-04-23 ENCOUNTER — Telehealth: Payer: Self-pay | Admitting: Neurology

## 2021-04-23 NOTE — Progress Notes (Signed)
Called patient and left a message for a call back.  

## 2021-04-23 NOTE — Telephone Encounter (Signed)
Pt is returning call to [facility]

## 2021-04-24 ENCOUNTER — Ambulatory Visit: Payer: 59 | Admitting: Neurology

## 2021-04-27 ENCOUNTER — Ambulatory Visit: Payer: 59 | Admitting: Neurology

## 2021-04-27 ENCOUNTER — Other Ambulatory Visit: Payer: 59

## 2021-04-28 ENCOUNTER — Other Ambulatory Visit: Payer: Self-pay

## 2021-04-28 ENCOUNTER — Other Ambulatory Visit (INDEPENDENT_AMBULATORY_CARE_PROVIDER_SITE_OTHER): Payer: 59

## 2021-04-28 DIAGNOSIS — E038 Other specified hypothyroidism: Secondary | ICD-10-CM | POA: Diagnosis not present

## 2021-04-28 DIAGNOSIS — E063 Autoimmune thyroiditis: Secondary | ICD-10-CM

## 2021-04-28 DIAGNOSIS — E559 Vitamin D deficiency, unspecified: Secondary | ICD-10-CM

## 2021-04-28 LAB — T4, FREE: Free T4: 0.63 ng/dL (ref 0.60–1.60)

## 2021-04-28 LAB — TSH: TSH: 0.17 u[IU]/mL — ABNORMAL LOW (ref 0.35–5.50)

## 2021-04-28 LAB — T3, FREE: T3, Free: 4.1 pg/mL (ref 2.3–4.2)

## 2021-04-29 LAB — VITAMIN D 25 HYDROXY (VIT D DEFICIENCY, FRACTURES): Vit D, 25-Hydroxy: 40 ng/mL (ref 30.0–100.0)

## 2021-05-12 ENCOUNTER — Ambulatory Visit (INDEPENDENT_AMBULATORY_CARE_PROVIDER_SITE_OTHER): Payer: 59 | Admitting: Neurology

## 2021-05-12 ENCOUNTER — Other Ambulatory Visit: Payer: Self-pay

## 2021-05-12 DIAGNOSIS — M79672 Pain in left foot: Secondary | ICD-10-CM

## 2021-05-12 DIAGNOSIS — R292 Abnormal reflex: Secondary | ICD-10-CM

## 2021-05-12 DIAGNOSIS — M79671 Pain in right foot: Secondary | ICD-10-CM | POA: Diagnosis not present

## 2021-05-12 DIAGNOSIS — R202 Paresthesia of skin: Secondary | ICD-10-CM

## 2021-05-12 DIAGNOSIS — G8929 Other chronic pain: Secondary | ICD-10-CM

## 2021-05-12 NOTE — Procedures (Signed)
[facility]  [address], [place]  [place], [place] [address] Tel: [phone] Fax:  [phone] Test Date:  05/12/2021  Patient: [name] DOB: [birth year] Physician: Narda Amber, DO  Sex: Female Height: 5\' 6"  Ref Phys: Narda Amber, DO  ID#: [id number]   Technician:    Patient Complaints: This is a 55 year old female referred for evaluation of bilateral feet pain.  NCV & EMG Findings: Electrodiagnostic testing of the right lower extremity and additional studies of the left shows: Bilateral sural and superficial peroneal sensory responses are within normal limits. Bilateral peroneal and tibial motor responses are within normal limits. Bilateral tibial H reflex studies are within normal limits. There is no evidence of active or chronic motor axonal changes affecting any of the tested muscles.  Motor unit configuration and recruitment pattern is within normal limits.  Impression: This is a normal study of the lower extremities.  In particular, there is no evidence of a large fiber sensorimotor polyneuropathy or lumbosacral radiculopathy.     ___________________________ Narda Amber, DO    Nerve Conduction Studies Anti Sensory Summary Table   Stim Site NR Peak (ms) Norm Peak (ms) P-T Amp (V) Norm P-T Amp  Left Sup Peroneal Anti Sensory (Ant Lat Mall)  33C  12 cm    2.1 <4.6 19.4 >4  Right Sup Peroneal Anti Sensory (Ant Lat Mall)  33C  12 cm    2.0 <4.6 18.8 >4  Left Sural Anti Sensory (Lat Mall)  33C  Calf    2.3 <4.6 22.0 >4  Right Sural Anti Sensory (Lat Mall)  33C  Calf    2.4 <4.6 21.0 >4   Motor Summary Table   Stim Site NR Onset (ms) Norm Onset (ms) O-P Amp (mV) Norm O-P Amp Site1 Site2 Delta-0 (ms) Dist (cm) Vel (m/s) Norm Vel (m/s)  Left Peroneal Motor (Ext Dig Brev)  33C  Ankle    3.1 <6.0 8.8 >2.5 B Fib Ankle 6.8 36.0 53 >40  B Fib    9.9  8.9  Poplt B Fib 1.4 8.0 57 >40  Poplt    11.3  8.3         Right Peroneal Motor (Ext Dig  Brev)  33C  Ankle    2.7 <6.0 7.9 >2.5 B Fib Ankle 7.0 39.0 56 >40  B Fib    9.7  7.6  Poplt B Fib 1.4 8.0 57 >40  Poplt    11.1  7.5         Left Tibial Motor (Abd Hall Brev)  33C  Ankle    3.3 <6.0 9.2 >4 Knee Ankle 7.5 41.0 55 >40  Knee    10.8  6.3         Right Tibial Motor (Abd Hall Brev)  33C  Ankle    3.2 <6.0 8.5 >4 Knee Ankle 7.3 43.0 59 >40  Knee    10.5  5.9          H Reflex Studies   NR H-Lat (ms) Lat Norm (ms) L-R H-Lat (ms)  Left Tibial (Gastroc)  33C     29.80 <35 2.04  Right Tibial (Gastroc)  33C     27.76 <35 2.04   EMG   Side Muscle Ins Act Fibs Psw Fasc Number Recrt Dur Dur. Amp Amp. Poly Poly. Comment  Right AntTibialis Nml Nml Nml Nml Nml Nml Nml Nml Nml Nml Nml Nml N/A  Right Gastroc Nml Nml Nml Nml Nml Nml Nml Nml Nml  Nml Nml Nml N/A  Right Flex Dig Long Nml Nml Nml Nml Nml Nml Nml Nml Nml Nml Nml Nml N/A  Right RectFemoris Nml Nml Nml Nml Nml Nml Nml Nml Nml Nml Nml Nml N/A  Right BicepsFemS Nml Nml Nml Nml Nml Nml Nml Nml Nml Nml Nml Nml N/A  Left BicepsFemS Nml Nml Nml Nml Nml Nml Nml Nml Nml Nml Nml Nml N/A  Left AntTibialis Nml Nml Nml Nml Nml Nml Nml Nml Nml Nml Nml Nml N/A  Left Gastroc Nml Nml Nml Nml Nml Nml Nml Nml Nml Nml Nml Nml N/A  Left Flex Dig Long Nml Nml Nml Nml Nml Nml Nml Nml Nml Nml Nml Nml N/A  Left RectFemoris Nml Nml Nml Nml Nml Nml Nml Nml Nml Nml Nml Nml N/A      Waveforms:

## 2021-05-15 ENCOUNTER — Other Ambulatory Visit: Payer: Self-pay | Admitting: Podiatry

## 2021-05-15 NOTE — Telephone Encounter (Signed)
Please Advise

## 2021-05-19 ENCOUNTER — Ambulatory Visit (INDEPENDENT_AMBULATORY_CARE_PROVIDER_SITE_OTHER): Payer: 59 | Admitting: Podiatry

## 2021-05-19 ENCOUNTER — Encounter: Payer: Self-pay | Admitting: Podiatry

## 2021-05-19 ENCOUNTER — Other Ambulatory Visit: Payer: Self-pay

## 2021-05-19 DIAGNOSIS — M722 Plantar fascial fibromatosis: Secondary | ICD-10-CM

## 2021-05-19 MED ORDER — TRIAMCINOLONE ACETONIDE 40 MG/ML IJ SUSP
40.0000 mg | Freq: Once | INTRAMUSCULAR | Status: AC
  Administered 2021-05-19: 40 mg

## 2021-05-19 MED ORDER — GABAPENTIN 300 MG PO CAPS: ORAL_CAPSULE | ORAL | 3 refills | Status: DC

## 2021-05-20 NOTE — Progress Notes (Signed)
She presents today states that she still has stinging and burning in her feet and legs.  States that she saw the neurologist and they said that there was no neuropathy she had MRI done and said there is only curvature of her spine.  So the neurologist says that they really do not know where this is coming from.  Objective: Vital signs are stable she is alert and oriented x3 still has some pain on palpation medial calcaneal tubercles bilaterally she still has some tenderness along the tibial nerve posteriorly on palpation.  This is noted to be bilaterally symmetrical.  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  Plan: Nerve conduction velocities failed to demonstrate any type of neuropathy.  I think to diagnose neuropathy were probably going to have to do a epidermal nerve fiber concentration biopsy so I will follow-up with her in about 3 weeks for that.  I also would like to consider an MRI with contrast bilateral feet should this biopsy fail to render answers for [place].  I injected the bilateral heels today 20 mg Kenalog 5 mg Marcaine point of maximal tenderness.  I am also going to increase her gabapentin to 300 mg in the morning and now 600 mg at nighttime 90-day supply with 3 refills.

## 2021-05-29 ENCOUNTER — Ambulatory Visit: Payer: 59 | Admitting: Neurology

## 2021-05-30 ENCOUNTER — Other Ambulatory Visit: Payer: Self-pay | Admitting: Physician Assistant

## 2021-05-30 DIAGNOSIS — E559 Vitamin D deficiency, unspecified: Secondary | ICD-10-CM

## 2021-06-02 ENCOUNTER — Ambulatory Visit (INDEPENDENT_AMBULATORY_CARE_PROVIDER_SITE_OTHER): Payer: 59 | Admitting: Podiatry

## 2021-06-02 ENCOUNTER — Other Ambulatory Visit: Payer: Self-pay

## 2021-06-02 DIAGNOSIS — G608 Other hereditary and idiopathic neuropathies: Secondary | ICD-10-CM | POA: Diagnosis not present

## 2021-06-02 NOTE — Progress Notes (Signed)
She presents today for follow-up of her neuropathy states that her neuropathy has not changed.  Normal nerve conduction velocity exam.  Objective: Pulses remain palpable no open lesions or wounds.  No changes in neurological evaluation.  Assessment: Peripheral neuropathy.  Plan: Epidermal nerve fiber concentration biopsies were performed today 10 cm above the lateral malleoli with use of local anesthetic.  The 2 mm punch biopsies were taken place and there respective containers be sent for pathologic evaluation.  The area was then wiped down and a bandage with Silvadene cream was placed follow-up with her in a couple of weeks we will notify her once her pathology comes back.

## 2021-06-08 ENCOUNTER — Other Ambulatory Visit (INDEPENDENT_AMBULATORY_CARE_PROVIDER_SITE_OTHER): Payer: 59

## 2021-06-08 ENCOUNTER — Encounter: Payer: Self-pay | Admitting: Internal Medicine

## 2021-06-08 ENCOUNTER — Other Ambulatory Visit: Payer: Self-pay

## 2021-06-08 DIAGNOSIS — E063 Autoimmune thyroiditis: Secondary | ICD-10-CM

## 2021-06-08 DIAGNOSIS — E038 Other specified hypothyroidism: Secondary | ICD-10-CM | POA: Diagnosis not present

## 2021-06-08 LAB — TSH: TSH: 0.89 u[IU]/mL (ref 0.35–5.50)

## 2021-06-08 LAB — T3, FREE: T3, Free: 2.2 pg/mL — ABNORMAL LOW (ref 2.3–4.2)

## 2021-06-08 LAB — T4, FREE: Free T4: 0.55 ng/dL — ABNORMAL LOW (ref 0.60–1.60)

## 2021-06-09 ENCOUNTER — Telehealth: Payer: Self-pay

## 2021-06-09 DIAGNOSIS — E038 Other specified hypothyroidism: Secondary | ICD-10-CM

## 2021-06-09 DIAGNOSIS — E063 Autoimmune thyroiditis: Secondary | ICD-10-CM

## 2021-06-09 MED ORDER — LEVOTHYROXINE SODIUM 125 MCG PO TABS
125.0000 ug | ORAL_TABLET | Freq: Every day | ORAL | 3 refills | Status: DC
Start: 2021-06-09 — End: 2021-08-06

## 2021-06-09 NOTE — Telephone Encounter (Signed)
This is a good regimen and the fact that the free t4 and free t3 are low is related to not taking the Armour for 24h... However, we can send Levothyroxine 125 mcg 45 tabs with 3 refills and have her come back in 1.5 mo for tests if she prefers.

## 2021-06-09 NOTE — Addendum Note (Signed)
Addended by: Lauralyn Primes on: 06/09/2021 10:51 AM   Modules accepted: Orders

## 2021-06-09 NOTE — Telephone Encounter (Signed)
Pt called back and advised she did not take her thyroid medication a full 24 hours before the labs and she was fasting. She expressed that she is feeling really tired and just not feeling well. She is hoping to get on something that will stabilize her TSH T3 and T4.

## 2021-06-09 NOTE — Telephone Encounter (Signed)
Called and left a message advising pt to call back regarding lab results. Dear Ms. [name], Your TSH is normal, so the dose of Armour is adequate.  The free T4 and free T3 are fluctuating, and reflect the timing of the medication before checking labs.  I usually look at these only if the TSH is abnormal.  Otherwise, they are not as important as the TSH.  I would not suggest a change Armour based on these results, but I did get your message about possibly trying another medication.  I would not suggest a change based on these labs, though.  If you still want to change it, we can try levothyroxine.  Please let me know. Sincerely, Philemon Kingdom MD  Written by Philemon Kingdom, MD on 06/08/2021  5:10 PM EDT

## 2021-06-09 NOTE — Telephone Encounter (Signed)
Rx sent to preferred pharmacy and pt advised via MyChart message to call and schedule follow up lab appt.

## 2021-06-11 ENCOUNTER — Other Ambulatory Visit: Payer: Self-pay | Admitting: Physician Assistant

## 2021-06-15 ENCOUNTER — Ambulatory Visit (INDEPENDENT_AMBULATORY_CARE_PROVIDER_SITE_OTHER): Payer: 59

## 2021-06-15 DIAGNOSIS — Z23 Encounter for immunization: Secondary | ICD-10-CM

## 2021-06-18 ENCOUNTER — Encounter: Payer: Self-pay | Admitting: Podiatry

## 2021-06-18 ENCOUNTER — Other Ambulatory Visit: Payer: Self-pay

## 2021-06-18 ENCOUNTER — Ambulatory Visit (INDEPENDENT_AMBULATORY_CARE_PROVIDER_SITE_OTHER): Payer: 59 | Admitting: Podiatry

## 2021-06-18 DIAGNOSIS — G5793 Unspecified mononeuropathy of bilateral lower limbs: Secondary | ICD-10-CM

## 2021-06-18 DIAGNOSIS — G608 Other hereditary and idiopathic neuropathies: Secondary | ICD-10-CM | POA: Diagnosis not present

## 2021-06-18 NOTE — Progress Notes (Signed)
She presents today for a follow-up of her epidermal nerve fiber density tests and discussion for further treatment.  States that with the gabapentin 300 mg 3 times a day she feels that if she augments her daily activities she can tolerate the pain but she would like to be able to work without pain.  Objective: Vital signs are stable she is alert and oriented times III nerve conduction velocity exams were negative so we looked for nerve morphology and concentration which does demonstrate degenerative morphological changes of the nerve endings on the biopsy.  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  Plan: Discussed etiology pathology conservative surgical therapies continue with gabapentin at this point and we are going to ask [facility] to evaluate her to see she would be a candidate for their treatment plans.

## 2021-06-23 ENCOUNTER — Encounter: Payer: Self-pay | Admitting: Physical Medicine and Rehabilitation

## 2021-07-01 ENCOUNTER — Ambulatory Visit: Payer: 59 | Admitting: Nurse Practitioner

## 2021-07-01 ENCOUNTER — Encounter: Payer: Self-pay | Admitting: Nurse Practitioner

## 2021-07-01 VITALS — BP 134/82 | HR 75 | Temp 96.5°F | Wt 191.0 lb

## 2021-07-01 DIAGNOSIS — R051 Acute cough: Secondary | ICD-10-CM

## 2021-07-01 DIAGNOSIS — J018 Other acute sinusitis: Secondary | ICD-10-CM

## 2021-07-01 LAB — POCT INFLUENZA A/B
Influenza A, POC: NEGATIVE
Influenza B, POC: NEGATIVE

## 2021-07-01 LAB — POCT RAPID STREP A (OFFICE): Rapid Strep A Screen: NEGATIVE

## 2021-07-01 LAB — POC COVID19 BINAXNOW: SARS Coronavirus 2 Ag: NEGATIVE

## 2021-07-01 MED ORDER — PROMETHAZINE-DM 6.25-15 MG/5ML PO SYRP: 5.0000 mL | ORAL_SOLUTION | Freq: Four times a day (QID) | ORAL | 0 refills | Status: DC | PRN

## 2021-07-01 MED ORDER — AZITHROMYCIN 250 MG PO TABS: ORAL_TABLET | ORAL | 0 refills | Status: AC

## 2021-07-01 MED ORDER — TRIAMCINOLONE ACETONIDE 40 MG/ML IJ SUSP
60.0000 mg | Freq: Once | INTRAMUSCULAR | Status: AC
  Administered 2021-07-01: 60 mg via INTRAMUSCULAR

## 2021-07-01 NOTE — Patient Instructions (Addendum)
Take Zpack as prescribed Take cough syrup up to four times as needed for cough Rest and push fluids Kenalog injection given in office Recommend replacing toothbrushes and toothpaste       Pharyngitis Pharyngitis is a sore throat (pharynx). This is when there is redness, pain, and swelling in your throat. Most of the time, this condition gets better on its own. In some cases, you may need medicine. What are the causes? An infection from a virus. An infection from bacteria. Allergies. What increases the risk? Being 43-14 years old. Being in crowded environments. These include: Daycares. Schools. Dormitories. Living in a place with cold temperatures outside. Having a weakened disease-fighting (immune) system. What are the signs or symptoms? Symptoms may vary depending on the cause. Common symptoms include: Sore throat. Tiredness (fatigue). Low-grade fever. Stuffy nose. Cough. Headache. Other symptoms may include: Glands in the neck (lymph nodes) that are swollen. Skin rashes. Film on the throat or tonsils. This can be caused by an infection from bacteria. Vomiting. Red, itchy eyes. Loss of appetite. Joint pain and muscle aches. Tonsils that are temporarily bigger than usual (enlarged). How is this treated? Many times, treatment is not needed. This condition usually gets better in 3-4 days without treatment. If the infection is caused by a bacteria, you may be need to take antibiotics. Follow these instructions at home: Medicines Take over-the-counter and prescription medicines only as told by your doctor. If you were prescribed an antibiotic medicine, take it as told by your doctor. Do not stop taking the antibiotic even if you start to feel better. Use throat lozenges or sprays to soothe your throat as told by your doctor. Children can get pharyngitis. Do not give your child aspirin. Managing pain To help with pain, try: Sipping warm liquids, such as: Broth. Herbal  tea. Warm water. Eating or drinking cold or frozen liquids, such as frozen ice pops. Rinsing your mouth (gargle) with a salt water mixture 3-4 times a day or as needed. To make salt water, dissolve -1 tsp (3-6 g) of salt in 1 cup (237 mL) of warm water. Do not swallow this mixture. Sucking on hard candy or throat lozenges. Putting a cool-mist humidifier in your bedroom at night to moisten the air. Sitting in the bathroom with the door closed for 5-10 minutes while you run hot water in the shower.  General instructions  Do not smoke or use any products that contain nicotine or tobacco. If you need help quitting, ask your doctor. Rest as told by your doctor. Drink enough fluid to keep your pee (urine) pale yellow. How is this prevented? Wash your hands often for at least 20 seconds with soap and water. If soap and water are not available, use hand sanitizer. Do not touch your eyes, nose, or mouth with unwashed hands. Wash hands after touching these areas. Do not share cups or eating utensils. Avoid close contact with people who are sick. Contact a doctor if: You have large, tender lumps in your neck. You have a rash. You cough up green, yellow-brown, or bloody spit. Get help right away if: You have a stiff neck. You drool or cannot swallow liquids. You cannot drink or take medicines without vomiting. You have very bad pain that does not go away with medicine. You have problems breathing, and it is not from a stuffy nose. You have new pain and swelling in your knees, ankles, wrists, or elbows. These symptoms may be an emergency. Get help right away. Call  your local emergency services (911 in the [place] Do not wait to see if the symptoms will go away. Do not drive yourself to the hospital. Summary Pharyngitis is a sore throat (pharynx). This is when there is redness, pain, and swelling in your throat. Most of the time, pharyngitis gets better on its own. Sometimes, you may need  medicine. If you were prescribed an antibiotic medicine, take it as told by your doctor. Do not stop taking the antibiotic even if you start to feel better. This information is not intended to replace advice given to you by your health care provider. Make sure you discuss any questions you have with your health care provider. Document Revised: 10/29/2020 Document Reviewed: 10/29/2020 Elsevier Patient Education  [employer].

## 2021-07-01 NOTE — Progress Notes (Signed)
Acute Office Visit  Subjective:    Patient ID: [name], female    DOB: 21-Apr-1966, 55 y.o.   MRN: [id number]  Chief Complaint  Patient presents with   URI     HPI: Patient is in today for Upper respiratory symptoms She complains of  nasal congestion, sinus pressure, sore throat, hoarseness, cough, and fatigue. Onset of symptoms was yesterday. She denies taking any treatments at home. She has obtained COVID-19 and seasonal flu immunizations. She is a non-smoker.    Past Medical History:  Diagnosis Date   Atrophy of thyroid (acquired)    Autoimmune thyroiditis    Chronic fatigue, unspecified    Gastro-esophageal reflux disease without esophagitis    Generalized anxiety disorder    Menopausal and female climacteric states    Migraine without aura, not intractable, without status migrainosus    Peripheral vascular disease ([facility]) 1992   blood clot with broken leg, coumadin x 6 mos   Vitamin D deficiency, unspecified     Past Surgical History:  Procedure Laterality Date   Nova sure ablation  2012   rotator cuff surgery  12/2014   TONSILLECTOMY     as a child    Family History  Problem Relation Age of Onset   Hypertension Mother    Hyperlipidemia Mother    Breast cancer Mother    Melanoma Mother    Migraines Mother    Diabetes type II Mother    Lung cancer Father    Hypertension Sister    Heart attack Maternal Grandmother    Heart attack Maternal Grandfather    Stroke Maternal Grandfather     Social History   Socioeconomic History   Marital status: Married    Spouse name: [name]   Number of children: 2   Years of education: 14   Highest education level: Associate degree: occupational, Scientist, product/process development, or vocational program  Occupational History   Occupation: Hair stylist  Tobacco Use   Smoking status: Never   Smokeless tobacco: Never  Vaping Use   Vaping Use: Never used  Substance and Sexual Activity   Alcohol use: Yes    Alcohol/week: 1.0  standard drink    Types: 1 Glasses of wine per week   Drug use: Never   Sexual activity: Yes    Partners: Male    Birth control/protection: Surgical  Other Topics Concern   Not on file  Social History Narrative   Right-handedness   One-story home   Social Determinants of Health   Financial Resource Strain: Not on file  Food Insecurity: Not on file  Transportation Needs: Not on file  Physical Activity: Not on file  Stress: Not on file  Social Connections: Not on file  Intimate Partner Violence: Not on file    Outpatient Medications Prior to Visit  Medication Sig Dispense Refill   clonazePAM (KLONOPIN) 0.5 MG tablet TAKE 1 TABLET BY MOUTH EVERYDAY AT BEDTIME 30 tablet 0   escitalopram (LEXAPRO) 20 MG tablet TAKE 1 TABLET BY MOUTH EVERY DAY 90 tablet 0   fexofenadine (ALLEGRA) 180 MG tablet Take 180 mg by mouth daily.     gabapentin (NEURONTIN) 300 MG capsule Take 1 capsule AM, 2 capsules PM 270 capsule 3   ibuprofen (ADVIL,MOTRIN) 200 MG tablet Take 400 mg by mouth daily as needed. For headache     levothyroxine (SYNTHROID) 125 MCG tablet Take 1 tablet (125 mcg total) by mouth daily before breakfast. 45 tablet 3   meloxicam (MOBIC) 15  MG tablet TAKE 1 TABLET (15 MG TOTAL) BY MOUTH DAILY. 30 tablet 3   Multiple Vitamin (MULTIVITAMIN) tablet Take 1 tablet by mouth daily.     polyethylene glycol (MIRALAX / GLYCOLAX) 17 g packet Take 17 g by mouth daily.     rosuvastatin (CRESTOR) 5 MG tablet TAKE 1 TABLET (5 MG TOTAL) BY MOUTH DAILY. 90 tablet 0   SUMAtriptan (IMITREX) 100 MG tablet TAKE 1 TABLET BY MOUTH AT ONSET OF MIGRAINE, REPEAT DOSE IN 2 HRS IF NEEDED, MAX 2 TABS IN 24 HRS 9 tablet 2   Vitamin D, Ergocalciferol, (DRISDOL) 1.25 MG (50000 UNIT) CAPS capsule TAKE 1 CAPSULE (50,000 UNITS TOTAL) BY MOUTH EVERY 7 (SEVEN) DAYS 12 capsule 1   No facility-administered medications prior to visit.    No Known Allergies  Review of Systems  Constitutional:  Positive for fatigue.  Negative for chills and fever.  HENT:  Positive for congestion, postnasal drip, rhinorrhea and sore throat. Negative for ear pain, sinus pressure and sinus pain.   Respiratory:  Positive for cough and chest tightness. Negative for shortness of breath.   Cardiovascular:  Negative for chest pain.  Gastrointestinal:  Negative for diarrhea and nausea.  Neurological:  Positive for headaches.      Objective:    Physical Exam Vitals reviewed.  Constitutional:      Appearance: She is ill-appearing.  HENT:     Nose: Congestion and rhinorrhea present.     Mouth/Throat:     Pharynx: Posterior oropharyngeal erythema present.  Cardiovascular:     Rate and Rhythm: Normal rate and regular rhythm.     Pulses: Normal pulses.     Heart sounds: Normal heart sounds.  Pulmonary:     Effort: Pulmonary effort is normal.     Breath sounds: Normal breath sounds.  Abdominal:     General: Bowel sounds are normal.     Palpations: Abdomen is soft.  Skin:    General: Skin is warm and dry.     Capillary Refill: Capillary refill takes less than 2 seconds.  Neurological:     General: No focal deficit present.     Mental Status: She is alert and oriented to person, place, and time.    Pulse 75   Temp (!) 96.5 F (35.8 C)   Wt 191 lb (86.6 kg)   SpO2 98%   BMI 30.83 kg/m  BP 134/82   Pulse 75   Temp (!) 96.5 F (35.8 C)   Wt 191 lb (86.6 kg)   SpO2 98%   BMI 30.83 kg/m   Wt Readings from Last 3 Encounters:  07/01/21 191 lb (86.6 kg)  04/02/21 191 lb (86.6 kg)  03/24/21 191 lb (86.6 kg)    Health Maintenance Due  Topic Date Due   Zoster Vaccines- Shingrix (1 of 2) Never done   COVID-19 Vaccine (3 - Booster for Moderna series) 01/17/2020   MAMMOGRAM  04/01/2021     Lab Results  Component Value Date   TSH 0.89 06/08/2021   Lab Results  Component Value Date   WBC 7.4 03/24/2021   HGB 12.2 03/24/2021   HCT 35.7 03/24/2021   MCV 92 03/24/2021   PLT 267 03/24/2021   Lab Results   Component Value Date   NA 141 03/24/2021   K 4.6 03/24/2021   CO2 25 03/24/2021   GLUCOSE 97 03/24/2021   BUN 13 03/24/2021   CREATININE 0.62 03/24/2021   BILITOT 0.4 03/24/2021   ALKPHOS 74 03/24/2021  AST 16 03/24/2021   ALT 14 03/24/2021   PROT 6.6 03/24/2021   ALBUMIN 4.4 03/24/2021   CALCIUM 9.4 03/24/2021   EGFR 106 03/24/2021   Lab Results  Component Value Date   CHOL 211 (H) 03/24/2021   Lab Results  Component Value Date   HDL 49 03/24/2021   Lab Results  Component Value Date   LDLCALC 134 (H) 03/24/2021   Lab Results  Component Value Date   TRIG 158 (H) 03/24/2021   Lab Results  Component Value Date   CHOLHDL 4.3 03/24/2021        Assessment & Plan:   1. Acute non-recurrent sinusitis of other sinus - azithromycin (ZITHROMAX) 250 MG tablet; Take 2 tablets on day 1, then 1 tablet daily on days 2 through 5  Dispense: 6 tablet; Refill: 0 - promethazine-dextromethorphan (PROMETHAZINE-DM) 6.25-15 MG/5ML syrup; Take 5 mLs by mouth 4 (four) times daily as needed.  Dispense: 118 mL; Refill: 0 - triamcinolone acetonide (KENALOG-40) injection 60 mg  2. Acute cough - POCT Influenza A/B-NEGATIVE - POC COVID-19 BinaxNow-NEGATIVE - POCT rapid strep A-NEGATIVE - promethazine-dextromethorphan (PROMETHAZINE-DM) 6.25-15 MG/5ML syrup; Take 5 mLs by mouth 4 (four) times daily as needed.  Dispense: 118 mL; Refill: 0    Take Zpack as prescribed Take cough syrup up to four times as needed for cough Rest and push fluids Kenalog injection given in office Recommend replacing toothbrushes and toothpaste      Follow-up: PRN  An After Visit Summary was printed and given to the patient.  I, Rip Harbour, NP, have reviewed all documentation for this visit. The documentation on 07/01/21 for the exam, diagnosis, procedures, and orders are all accurate and complete.    Signed, Rip Harbour, NP [place] [phone]

## 2021-07-15 ENCOUNTER — Other Ambulatory Visit: Payer: Self-pay | Admitting: Physician Assistant

## 2021-07-22 ENCOUNTER — Other Ambulatory Visit: Payer: 59

## 2021-07-28 ENCOUNTER — Encounter: Payer: Self-pay | Admitting: Physician Assistant

## 2021-07-28 ENCOUNTER — Ambulatory Visit (INDEPENDENT_AMBULATORY_CARE_PROVIDER_SITE_OTHER): Payer: 59 | Admitting: Physician Assistant

## 2021-07-28 ENCOUNTER — Other Ambulatory Visit: Payer: Self-pay

## 2021-07-28 VITALS — BP 108/68 | HR 89 | Temp 97.1°F | Ht 66.0 in | Wt 191.0 lb

## 2021-07-28 DIAGNOSIS — J189 Pneumonia, unspecified organism: Secondary | ICD-10-CM

## 2021-07-28 MED ORDER — PREDNISONE 20 MG PO TABS: ORAL_TABLET | ORAL | 0 refills | Status: AC

## 2021-07-28 MED ORDER — GUAIFENESIN-CODEINE 100-10 MG/5ML PO SYRP: 5.0000 mL | ORAL_SOLUTION | Freq: Three times a day (TID) | ORAL | 0 refills | Status: DC | PRN

## 2021-07-28 MED ORDER — LEVOFLOXACIN 500 MG PO TABS
500.0000 mg | ORAL_TABLET | Freq: Every day | ORAL | 0 refills | Status: AC
Start: 2021-07-28 — End: 2021-08-04

## 2021-07-28 NOTE — Progress Notes (Signed)
Acute Office Visit  Subjective:    Patient ID: [name], female    DOB: 1966-01-05, 55 y.o.   MRN: [id number]  Chief Complaint  Patient presents with   Chest Congestion    HPI: Patient is in today for complaints of pneumonia - she was diagnosed at Urgent care about 2 weeks ago with bilateral pneumonia (will send for chest xray) - complains she is still coughing and having some soreness in back with coughing - cough still productive No fever - no malaise  Past Medical History:  Diagnosis Date   Atrophy of thyroid (acquired)    Autoimmune thyroiditis    Chronic fatigue, unspecified    Gastro-esophageal reflux disease without esophagitis    Generalized anxiety disorder    Menopausal and female climacteric states    Migraine without aura, not intractable, without status migrainosus    Peripheral vascular disease ([place]) 1992   blood clot with broken leg, coumadin x 6 mos   Vitamin D deficiency, unspecified     Past Surgical History:  Procedure Laterality Date   Nova sure ablation  2012   rotator cuff surgery  12/2014   TONSILLECTOMY     as a child    Family History  Problem Relation Age of Onset   Hypertension Mother    Hyperlipidemia Mother    Breast cancer Mother    Melanoma Mother    Migraines Mother    Diabetes type II Mother    Lung cancer Father    Hypertension Sister    Heart attack Maternal Grandmother    Heart attack Maternal Grandfather    Stroke Maternal Grandfather     Social History   Socioeconomic History   Marital status: Married    Spouse name: Sachi Boulay   Number of children: 2   Years of education: 14   Highest education level: Associate degree: occupational, Hotel manager, or vocational program  Occupational History   Occupation: Hair stylist  Tobacco Use   Smoking status: Never   Smokeless tobacco: Never  Vaping Use   Vaping Use: Never used  Substance and Sexual Activity   Alcohol use: Yes    Alcohol/week: 1.0 standard drink     Types: 1 Glasses of wine per week   Drug use: Never   Sexual activity: Yes    Partners: Male    Birth control/protection: Surgical  Other Topics Concern   Not on file  Social History Narrative   Right-handedness   One-story home   Social Determinants of Health   Financial Resource Strain: Not on file  Food Insecurity: Not on file  Transportation Needs: Not on file  Physical Activity: Not on file  Stress: Not on file  Social Connections: Not on file  Intimate Partner Violence: Not on file    Outpatient Medications Prior to Visit  Medication Sig Dispense Refill   clonazePAM (KLONOPIN) 0.5 MG tablet TAKE 1 TABLET BY MOUTH EVERYDAY AT BEDTIME 30 tablet 0   escitalopram (LEXAPRO) 20 MG tablet TAKE 1 TABLET BY MOUTH EVERY DAY 90 tablet 0   fexofenadine (ALLEGRA) 180 MG tablet Take 180 mg by mouth daily.     gabapentin (NEURONTIN) 300 MG capsule Take 1 capsule AM, 2 capsules PM 270 capsule 3   ibuprofen (ADVIL,MOTRIN) 200 MG tablet Take 400 mg by mouth daily as needed. For headache     levothyroxine (SYNTHROID) 125 MCG tablet Take 1 tablet (125 mcg total) by mouth daily before breakfast. 45 tablet 3   meloxicam (  MOBIC) 15 MG tablet TAKE 1 TABLET (15 MG TOTAL) BY MOUTH DAILY. 30 tablet 3   Multiple Vitamin (MULTIVITAMIN) tablet Take 1 tablet by mouth daily.     polyethylene glycol (MIRALAX / GLYCOLAX) 17 g packet Take 17 g by mouth daily.     rosuvastatin (CRESTOR) 5 MG tablet TAKE 1 TABLET (5 MG TOTAL) BY MOUTH DAILY. 90 tablet 0   SUMAtriptan (IMITREX) 100 MG tablet TAKE 1 TABLET BY MOUTH AT ONSET OF MIGRAINE, REPEAT DOSE IN 2 HRS IF NEEDED, MAX 2 TABS IN 24 HRS 9 tablet 2   Vitamin D, Ergocalciferol, (DRISDOL) 1.25 MG (50000 UNIT) CAPS capsule TAKE 1 CAPSULE (50,000 UNITS TOTAL) BY MOUTH EVERY 7 (SEVEN) DAYS 12 capsule 1   promethazine-dextromethorphan (PROMETHAZINE-DM) 6.25-15 MG/5ML syrup Take 5 mLs by mouth 4 (four) times daily as needed. 118 mL 0   No facility-administered  medications prior to visit.    No Known Allergies  Review of Systems CONSTITUTIONAL: Negative for chills, fatigue, fever, unintentional weight gain and unintentional weight loss.  E/N/T: see HPI CARDIOVASCULAR: Negative for chest pain, dizziness, palpitations and pedal edema.  RESPIRATORY:see HPI INTEGUMENTARY: Negative for rash.       Objective:    Physical Exam PHYSICAL EXAM:   VS: BP 108/68 (BP Location: Left Arm, Patient Position: Sitting, Cuff Size: Normal)    Pulse 89    Temp (!) 97.1 F (36.2 C) (Temporal)    Ht _0  (1.676 m)    Wt 191 lb (86.6 kg)    SpO2 98%    BMI 30.83 kg/m   GEN: Well nourished, well developed, in no acute distress  HEENT: normal external ears and nose - normal external auditory canals and TMS - - Lips, Teeth and Gums - normal  Oropharynx - erythema/pnd Cardiac: RRR; no murmurs,  Respiratory:  faint rhonchi - clears with cough Psych: euthymic mood, appropriate affect and demeanor  BP 108/68 (BP Location: Left Arm, Patient Position: Sitting, Cuff Size: Normal)    Pulse 89    Temp (!) 97.1 F (36.2 C) (Temporal)    Ht _1  (1.676 m)    Wt 191 lb (86.6 kg)    SpO2 98%    BMI 30.83 kg/m  Wt Readings from Last 3 Encounters:  07/28/21 191 lb (86.6 kg)  07/01/21 191 lb (86.6 kg)  04/02/21 191 lb (86.6 kg)    Health Maintenance Due  Topic Date Due   Zoster Vaccines- Shingrix (1 of 2) Never done   COVID-19 Vaccine (3 - Booster for Moderna series) 01/17/2020   MAMMOGRAM  04/01/2021    There are no preventive care reminders to display for this patient.   Lab Results  Component Value Date   TSH 0.89 06/08/2021   Lab Results  Component Value Date   WBC 7.4 03/24/2021   HGB 12.2 03/24/2021   HCT 35.7 03/24/2021   MCV 92 03/24/2021   PLT 267 03/24/2021   Lab Results  Component Value Date   NA 141 03/24/2021   K 4.6 03/24/2021   CO2 25 03/24/2021   GLUCOSE 97 03/24/2021   BUN 13 03/24/2021   CREATININE 0.62 03/24/2021   BILITOT 0.4  03/24/2021   ALKPHOS 74 03/24/2021   AST 16 03/24/2021   ALT 14 03/24/2021   PROT 6.6 03/24/2021   ALBUMIN 4.4 03/24/2021   CALCIUM 9.4 03/24/2021   EGFR 106 03/24/2021   Lab Results  Component Value Date   CHOL 211 (H) 03/24/2021   Lab  Results  Component Value Date   HDL 49 03/24/2021   Lab Results  Component Value Date   LDLCALC 134 (H) 03/24/2021   Lab Results  Component Value Date   TRIG 158 (H) 03/24/2021   Lab Results  Component Value Date   CHOLHDL 4.3 03/24/2021   No results found for: HGBA1C     Assessment & Plan:   Problem List Items Addressed This Visit   None Visit Diagnoses     Pneumonia of lower lobe due to infectious organism, unspecified laterality    -  Primary   Relevant Medications   levofloxacin (LEVAQUIN) 500 MG tablet   predniSONE (DELTASONE) 20 MG tablet   guaiFENesin-codeine (ROBITUSSIN AC) 100-10 MG/5ML syrup      Meds ordered this encounter  Medications   levofloxacin (LEVAQUIN) 500 MG tablet    Sig: Take 1 tablet (500 mg total) by mouth daily for 7 days.    Dispense:  7 tablet    Refill:  0    Order Specific Question:   Supervising Provider    Answer:   Shelton Silvas   predniSONE (DELTASONE) 20 MG tablet    Sig: Take 3 tablets (60 mg total) by mouth daily with breakfast for 3 days, THEN 2 tablets (40 mg total) daily with breakfast for 3 days, THEN 1 tablet (20 mg total) daily with breakfast for 3 days.    Dispense:  18 tablet    Refill:  0    Order Specific Question:   Supervising Provider    Answer:   COX, Lynder Parents   guaiFENesin-codeine (ROBITUSSIN AC) 100-10 MG/5ML syrup    Sig: Take 5 mLs by mouth 3 (three) times daily as needed for cough.    Dispense:  120 mL    Refill:  0    Order Specific Question:   Supervising Provider    Answer:   Shelton Silvas    No orders of the defined types were placed in this encounter.  Plan to repeat chest xray in one month  Follow-up: Return if symptoms worsen or  fail to improve.  An After Visit Summary was printed and given to the patient.  Yetta Flock [facility] [phone]

## 2021-07-31 ENCOUNTER — Other Ambulatory Visit: Payer: Self-pay | Admitting: Physician Assistant

## 2021-08-05 ENCOUNTER — Other Ambulatory Visit: Payer: Self-pay

## 2021-08-05 ENCOUNTER — Other Ambulatory Visit (INDEPENDENT_AMBULATORY_CARE_PROVIDER_SITE_OTHER): Payer: 59

## 2021-08-05 DIAGNOSIS — E039 Hypothyroidism, unspecified: Secondary | ICD-10-CM | POA: Diagnosis not present

## 2021-08-05 LAB — T4, FREE: Free T4: 1.22 ng/dL (ref 0.60–1.60)

## 2021-08-05 LAB — HM MAMMOGRAPHY

## 2021-08-05 LAB — T3, FREE: T3, Free: 3.3 pg/mL (ref 2.3–4.2)

## 2021-08-05 LAB — HM PAP SMEAR

## 2021-08-05 LAB — TSH: TSH: 0.03 u[IU]/mL — ABNORMAL LOW (ref 0.35–5.50)

## 2021-08-06 ENCOUNTER — Encounter: Payer: Self-pay | Admitting: Internal Medicine

## 2021-08-06 MED ORDER — LEVOTHYROXINE SODIUM 112 MCG PO TABS: 112.0000 ug | ORAL_TABLET | Freq: Every day | ORAL | 5 refills | Status: DC

## 2021-08-19 ENCOUNTER — Encounter: Payer: Self-pay | Admitting: Physician Assistant

## 2021-09-08 ENCOUNTER — Other Ambulatory Visit: Payer: Self-pay | Admitting: Physician Assistant

## 2021-09-13 ENCOUNTER — Other Ambulatory Visit: Payer: Self-pay | Admitting: Podiatry

## 2021-09-14 ENCOUNTER — Encounter: Payer: Self-pay | Admitting: Internal Medicine

## 2021-09-14 ENCOUNTER — Other Ambulatory Visit: Payer: Self-pay

## 2021-09-14 ENCOUNTER — Other Ambulatory Visit: Payer: Self-pay | Admitting: Internal Medicine

## 2021-09-14 ENCOUNTER — Other Ambulatory Visit (INDEPENDENT_AMBULATORY_CARE_PROVIDER_SITE_OTHER): Payer: 59

## 2021-09-14 DIAGNOSIS — E039 Hypothyroidism, unspecified: Secondary | ICD-10-CM

## 2021-09-14 DIAGNOSIS — E038 Other specified hypothyroidism: Secondary | ICD-10-CM

## 2021-09-14 LAB — TSH: TSH: 0.02 u[IU]/mL — ABNORMAL LOW (ref 0.35–5.50)

## 2021-09-14 LAB — T4, FREE: Free T4: 1.37 ng/dL (ref 0.60–1.60)

## 2021-09-14 MED ORDER — LEVOTHYROXINE SODIUM 100 MCG PO TABS: 100.0000 ug | ORAL_TABLET | Freq: Every day | ORAL | 3 refills | Status: DC

## 2021-09-14 NOTE — Telephone Encounter (Signed)
Please advise 

## 2021-09-29 ENCOUNTER — Other Ambulatory Visit: Payer: Self-pay

## 2021-09-29 ENCOUNTER — Ambulatory Visit: Payer: 59 | Admitting: Physician Assistant

## 2021-09-29 ENCOUNTER — Encounter: Payer: Self-pay | Admitting: Physician Assistant

## 2021-09-29 VITALS — BP 108/80 | HR 69 | Temp 97.7°F | Ht 66.0 in | Wt 186.4 lb

## 2021-09-29 DIAGNOSIS — E559 Vitamin D deficiency, unspecified: Secondary | ICD-10-CM | POA: Diagnosis not present

## 2021-09-29 DIAGNOSIS — E782 Mixed hyperlipidemia: Secondary | ICD-10-CM

## 2021-09-29 DIAGNOSIS — F419 Anxiety disorder, unspecified: Secondary | ICD-10-CM | POA: Diagnosis not present

## 2021-09-29 DIAGNOSIS — E039 Hypothyroidism, unspecified: Secondary | ICD-10-CM

## 2021-09-29 DIAGNOSIS — M792 Neuralgia and neuritis, unspecified: Secondary | ICD-10-CM

## 2021-09-29 NOTE — Progress Notes (Signed)
Subjective:  Patient ID: [name], female    DOB: [birth year]  Age: 56 y.o. MRN: [id number]  Chief Complaint  Patient presents with   Hypothyroidism    HPI  Pt with history of hypothyroidism - currently on synthroid 144mcg and follows with endocrinology - states she just had labwork 3 weeks ago for this issue  Pt with history of anxiety -  currently stable on medication klonopin and lexapro -   Pt with history of vit D deficiency - is on supplement and due for labwork  Pt with history of hyperlipidemia - does watch diet - states she is taking crestor 5mg  qd - due for labwork  Pt is being followed by specialist for chronic foot pain/neuralgia - she is taking mobic and gabapentin for pain which is being managed at this time Current Outpatient Medications on File Prior to Visit  Medication Sig Dispense Refill   clonazePAM (KLONOPIN) 0.5 MG tablet TAKE 1 TABLET BY MOUTH EVERYDAY AT BEDTIME 30 tablet 0   escitalopram (LEXAPRO) 20 MG tablet TAKE 1 TABLET BY MOUTH EVERY DAY 90 tablet 0   fexofenadine (ALLEGRA) 180 MG tablet Take 180 mg by mouth daily.     gabapentin (NEURONTIN) 300 MG capsule Take 1 capsule AM, 2 capsules PM 270 capsule 3   levothyroxine (SYNTHROID) 100 MCG tablet Take 1 tablet (100 mcg total) by mouth daily. 45 tablet 3   meloxicam (MOBIC) 15 MG tablet TAKE 1 TABLET (15 MG TOTAL) BY MOUTH DAILY. 30 tablet 3   Multiple Vitamin (MULTIVITAMIN) tablet Take 1 tablet by mouth daily.     polyethylene glycol (MIRALAX / GLYCOLAX) 17 g packet Take 17 g by mouth daily.     rosuvastatin (CRESTOR) 5 MG tablet TAKE 1 TABLET (5 MG TOTAL) BY MOUTH DAILY. 90 tablet 0   SUMAtriptan (IMITREX) 100 MG tablet TAKE 1 TABLET BY MOUTH AT ONSET OF MIGRAINE, REPEAT DOSE IN 2 HRS IF NEEDED, MAX 2 TABS IN 24 HRS 9 tablet 2   Vitamin D, Ergocalciferol, (DRISDOL) 1.25 MG (50000 UNIT) CAPS capsule TAKE 1 CAPSULE (50,000 UNITS TOTAL) BY MOUTH EVERY 7 (SEVEN) DAYS 12 capsule 1   No current  facility-administered medications on file prior to visit.   Past Medical History:  Diagnosis Date   Atrophy of thyroid (acquired)    Autoimmune thyroiditis    Chronic fatigue, unspecified    Gastro-esophageal reflux disease without esophagitis    Generalized anxiety disorder    Menopausal and female climacteric states    Migraine without aura, not intractable, without status migrainosus    Peripheral vascular disease ([place]) 1992   blood clot with broken leg, coumadin x 6 mos   Vitamin D deficiency, unspecified    Past Surgical History:  Procedure Laterality Date   Nova sure ablation  2012   rotator cuff surgery  12/2014   TONSILLECTOMY     as a child    Family History  Problem Relation Age of Onset   Hypertension Mother    Hyperlipidemia Mother    Breast cancer Mother    Melanoma Mother    Migraines Mother    Diabetes type II Mother    Lung cancer Father    Hypertension Sister    Heart attack Maternal Grandmother    Heart attack Maternal Grandfather    Stroke Maternal Grandfather    Social History   Socioeconomic History   Marital status: Married    Spouse name: Reggie Welge   Number of children:  2   Years of education: 14   Highest education level: Associate degree: occupational, Hotel manager, or vocational program  Occupational History   Occupation: Hair stylist  Tobacco Use   Smoking status: Never   Smokeless tobacco: Never  Vaping Use   Vaping Use: Never used  Substance and Sexual Activity   Alcohol use: Yes    Alcohol/week: 1.0 standard drink    Types: 1 Glasses of wine per week   Drug use: Never   Sexual activity: Yes    Partners: Male    Birth control/protection: Surgical  Other Topics Concern   Not on file  Social History Narrative   Right-handedness   One-story home   Social Determinants of Health   Financial Resource Strain: Not on file  Food Insecurity: Not on file  Transportation Needs: Not on file  Physical Activity: Not on file  Stress:  Not on file  Social Connections: Not on file    Review of Systems CONSTITUTIONAL: Negative for chills, fatigue, fever, unintentional weight gain and unintentional weight loss.  CARDIOVASCULAR: Negative for chest pain, dizziness, palpitations and pedal edema.  RESPIRATORY: Negative for recent cough and dyspnea.  GASTROINTESTINAL: Negative for abdominal pain, acid reflux symptoms, constipation, diarrhea, nausea and vomiting.  MSK: see HPI INTEGUMENTARY: Negative for rash.  NEUROLOGICAL: Negative for dizziness and headaches.  PSYCHIATRIC: Negative for sleep disturbance and to question depression screen.  Negative for depression, negative for anhedonia.       Objective:  PHYSICAL EXAM:   VS: BP 108/80    Pulse 69    Temp 97.7 F (36.5 C)    Ht 5\' 6"  (1.676 m)    Wt 186 lb 6.4 oz (84.6 kg)    SpO2 96%    BMI 30.09 kg/m   GEN: Well nourished, well developed, in no acute distress  Cardiac: RRR; no murmurs, rubs, or gallops,no edema - does have significant spider veins lower extremities Respiratory:  normal respiratory rate and pattern with no distress - normal breath sounds with no rales, rhonchi, wheezes or rubs MS: no deformity or atrophy  Skin: warm and dry, no rash  Psych: euthymic mood, appropriate affect and demeanor   Lab Results  Component Value Date   WBC 7.4 03/24/2021   HGB 12.2 03/24/2021   HCT 35.7 03/24/2021   PLT 267 03/24/2021   GLUCOSE 97 03/24/2021   CHOL 211 (H) 03/24/2021   TRIG 158 (H) 03/24/2021   HDL 49 03/24/2021   LDLCALC 134 (H) 03/24/2021   ALT 14 03/24/2021   AST 16 03/24/2021   NA 141 03/24/2021   K 4.6 03/24/2021   CL 105 03/24/2021   CREATININE 0.62 03/24/2021   BUN 13 03/24/2021   CO2 25 03/24/2021   TSH 0.02 (L) 09/14/2021      Assessment & Plan:   Problem List Items Addressed This Visit       Endocrine   Acquired hypothyroidism Continue follow up with endocrinologist     Other   Vitamin D insufficiency   Relevant Orders    VITAMIN D 25 Hydroxy (Vit-D Deficiency, Fractures)   Other Visit Diagnoses     Neuralgia    -  Primary   Relevant Orders   CBC with Differential/Platelet   Comprehensive metabolic panel   Anxiety     Continue current meds   Mixed hyperlipidemia       Relevant Orders   CBC with Differential/Platelet   Comprehensive metabolic panel   Lipid panel     .  No orders of the defined types were placed in this encounter.   Orders Placed This Encounter  Procedures   CBC with Differential/Platelet   Comprehensive metabolic panel   Lipid panel   VITAMIN D 25 Hydroxy (Vit-D Deficiency, Fractures)     Follow-up: Return in about 6 months (around 03/29/2022) for chronic fasting follow up.  An After Visit Summary was printed and given to the patient.  Yetta Flock [facility] [phone]

## 2021-09-30 LAB — CBC WITH DIFFERENTIAL/PLATELET
Basophils Absolute: 0.1 10*3/uL (ref 0.0–0.2)
Basos: 1 %
EOS (ABSOLUTE): 0.1 10*3/uL (ref 0.0–0.4)
Eos: 2 %
Hematocrit: 38.7 % (ref 34.0–46.6)
Hemoglobin: 12.7 g/dL (ref 11.1–15.9)
Immature Grans (Abs): 0 10*3/uL (ref 0.0–0.1)
Immature Granulocytes: 0 %
Lymphocytes Absolute: 1.9 10*3/uL (ref 0.7–3.1)
Lymphs: 30 %
MCH: 30 pg (ref 26.6–33.0)
MCHC: 32.8 g/dL (ref 31.5–35.7)
MCV: 92 fL (ref 79–97)
Monocytes Absolute: 0.5 10*3/uL (ref 0.1–0.9)
Monocytes: 8 %
Neutrophils Absolute: 3.8 10*3/uL (ref 1.4–7.0)
Neutrophils: 59 %
Platelets: 273 10*3/uL (ref 150–450)
RBC: 4.23 x10E6/uL (ref 3.77–5.28)
RDW: 12.8 % (ref 11.7–15.4)
WBC: 6.5 10*3/uL (ref 3.4–10.8)

## 2021-09-30 LAB — LIPID PANEL
Chol/HDL Ratio: 2.7 ratio (ref 0.0–4.4)
Cholesterol, Total: 140 mg/dL (ref 100–199)
HDL: 52 mg/dL (ref >39)
LDL Chol Calc (NIH): 75 mg/dL (ref 0–99)
Triglycerides: 60 mg/dL (ref 0–149)
VLDL Cholesterol Cal: 13 mg/dL (ref 5–40)

## 2021-09-30 LAB — COMPREHENSIVE METABOLIC PANEL
ALT: 17 IU/L (ref 0–32)
AST: 16 IU/L (ref 0–40)
Albumin/Globulin Ratio: 2 (ref 1.2–2.2)
Albumin: 4.3 g/dL (ref 3.8–4.9)
Alkaline Phosphatase: 64 IU/L (ref 44–121)
BUN/Creatinine Ratio: 17 (ref 9–23)
BUN: 11 mg/dL (ref 6–24)
Bilirubin Total: 0.4 mg/dL (ref 0.0–1.2)
CO2: 25 mmol/L (ref 20–29)
Calcium: 9.4 mg/dL (ref 8.7–10.2)
Chloride: 108 mmol/L — ABNORMAL HIGH (ref 96–106)
Creatinine, Ser: 0.63 mg/dL (ref 0.57–1.00)
Globulin, Total: 2.1 g/dL (ref 1.5–4.5)
Glucose: 95 mg/dL (ref 70–99)
Potassium: 4.6 mmol/L (ref 3.5–5.2)
Sodium: 145 mmol/L — ABNORMAL HIGH (ref 134–144)
Total Protein: 6.4 g/dL (ref 6.0–8.5)
eGFR: 105 mL/min/{1.73_m2} (ref >59)

## 2021-09-30 LAB — CARDIOVASCULAR RISK ASSESSMENT

## 2021-09-30 LAB — VITAMIN D 25 HYDROXY (VIT D DEFICIENCY, FRACTURES): Vit D, 25-Hydroxy: 46.6 ng/mL (ref 30.0–100.0)

## 2021-10-16 ENCOUNTER — Other Ambulatory Visit: Payer: Self-pay | Admitting: Physician Assistant

## 2021-10-19 ENCOUNTER — Other Ambulatory Visit (INDEPENDENT_AMBULATORY_CARE_PROVIDER_SITE_OTHER): Payer: 59

## 2021-10-19 ENCOUNTER — Other Ambulatory Visit: Payer: Self-pay

## 2021-10-19 DIAGNOSIS — E063 Autoimmune thyroiditis: Secondary | ICD-10-CM

## 2021-10-19 DIAGNOSIS — E038 Other specified hypothyroidism: Secondary | ICD-10-CM

## 2021-10-19 LAB — TSH: TSH: 0.02 u[IU]/mL — ABNORMAL LOW (ref 0.35–5.50)

## 2021-10-19 LAB — T4, FREE: Free T4: 1.09 ng/dL (ref 0.60–1.60)

## 2021-10-20 MED ORDER — LEVOTHYROXINE SODIUM 88 MCG PO TABS: 88.0000 ug | ORAL_TABLET | Freq: Every day | ORAL | 5 refills | Status: DC

## 2021-10-21 ENCOUNTER — Other Ambulatory Visit: Payer: Self-pay | Admitting: Internal Medicine

## 2021-10-21 ENCOUNTER — Encounter: Payer: Self-pay | Admitting: Internal Medicine

## 2021-10-21 MED ORDER — SYNTHROID 88 MCG PO TABS: 88.0000 ug | ORAL_TABLET | Freq: Every day | ORAL | 3 refills | Status: DC

## 2021-10-22 ENCOUNTER — Other Ambulatory Visit: Payer: Self-pay

## 2021-10-22 ENCOUNTER — Encounter: Payer: 59 | Attending: Physical Medicine and Rehabilitation | Admitting: Physical Medicine and Rehabilitation

## 2021-10-22 VITALS — BP 112/76 | HR 68 | Ht 66.0 in | Wt 182.0 lb

## 2021-10-22 DIAGNOSIS — G6289 Other specified polyneuropathies: Secondary | ICD-10-CM | POA: Insufficient documentation

## 2021-10-22 DIAGNOSIS — E559 Vitamin D deficiency, unspecified: Secondary | ICD-10-CM | POA: Insufficient documentation

## 2021-10-22 DIAGNOSIS — R7303 Prediabetes: Secondary | ICD-10-CM | POA: Diagnosis present

## 2021-10-22 NOTE — Progress Notes (Signed)
? ?Subjective:  ? ? Patient ID: [name], female    DOB: Mar 22, 1966, 56 y.o.   MRN: [id number] ? ?HPI ?[name] is a 56 year year old woman presenting to establish care for diabetic peripheral neuropathy.  ? ?1) Peripheral neuropathy ?-has been present for a year ?-she is reading my pain journal book and she loves blueberries and cherries ?-she has never been diagnosed with diabetes ?-pain is present in forefoot and top of toes ?-she would like to try Qutenza ? ?2) Overweight ?-she has tried Weight Watchers and lost 15 lbs but this did not help her pain ? ?3) Plantar fasciitis: ?-she was diagnosed with plantar fasciitis and this was treated.  ? ?Pain Inventory ?Average Pain 6 ?Pain Right Now 9 ?My pain is constant, burning, and tingling ? ?In the last 24 hours, has pain interfered with the following? ?General activity 5 ?Relation with others 5 ?Enjoyment of life 5 ?What TIME of day is your pain at its worst? night ?Sleep (in general) Poor ? ?Pain is worse with: walking, sitting, inactivity, and standing ?Pain improves with: medication and injections ?Relief from Meds: 5 ? ?walk without assistance ?how many minutes can you walk? unlimited ?ability to climb steps?  yes ?do you drive?  yes ? 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 ?bladder control problems ?weakness ?numbness ?tingling ?trouble walking ?spasms ?confusion ?depression ?anxiety ? ?New pt ? ?New pt ? ? ? ?Family History  ?Problem Relation Age of Onset  ? Hypertension Mother   ? Hyperlipidemia Mother   ? Breast cancer Mother   ? Melanoma Mother   ? Migraines Mother   ? Diabetes type II Mother   ? Lung cancer Father   ? Hypertension Sister   ? Heart attack Maternal Grandmother   ? Heart attack Maternal Grandfather   ? Stroke Maternal Grandfather   ? ?Social History  ? ?Socioeconomic History  ? Marital status: Married  ?  Spouse name: Zowie Lundahl  ? Number of children: 2  ? Years of education: 91  ? Highest education level:  Associate degree: occupational, Hotel manager, or vocational program  ?Occupational History  ? Occupation: Hair stylist  ?Tobacco Use  ? Smoking status: Never  ? Smokeless tobacco: Never  ?Vaping Use  ? Vaping Use: Never used  ?Substance and Sexual Activity  ? Alcohol use: Yes  ?  Alcohol/week: 1.0 standard drink  ?  Types: 1 Glasses of wine per week  ? Drug use: Never  ? Sexual activity: Yes  ?  Partners: Male  ?  Birth control/protection: Surgical  ?Other Topics Concern  ? Not on file  ?Social History Narrative  ? Right-handedness  ? One-story home  ? ?Social Determinants of Health  ? ?Financial Resource Strain: Not on file  ?Food Insecurity: Not on file  ?Transportation Needs: Not on file  ?Physical Activity: Not on file  ?Stress: Not on file  ?Social Connections: Not on file  ? ?Past Surgical History:  ?Procedure Laterality Date  ? Nova sure ablation  2012  ? rotator cuff surgery  12/2014  ? TONSILLECTOMY    ? as a child  ? ?Past Medical History:  ?Diagnosis Date  ? Atrophy of thyroid (acquired)   ? Autoimmune thyroiditis   ? Chronic fatigue, unspecified   ? Gastro-esophageal reflux disease without esophagitis   ? Generalized anxiety disorder   ? Menopausal and female climacteric states   ? Migraine without aura, not intractable, without status  migrainosus   ? Peripheral vascular disease ([place]) 1992  ? blood clot with broken leg, coumadin x 6 mos  ? Vitamin D deficiency, unspecified   ? ?BP 112/76   Pulse 68   Ht '5\' 6"'$  (1.676 m)   Wt 182 lb (82.6 kg)   SpO2 99%   BMI 29.38 kg/m?  ? ?Opioid Risk Score:   ?Fall Risk Score:  `1 ? ?Depression screen PHQ 2/9 ? ?Depression screen [facility] 2/9 10/22/2021 09/29/2021 03/24/2021 12/15/2020 12/15/2020  ?Decreased Interest 0 0 2 0 0  ?Down, Depressed, Hopeless 1 0 0 0 0  ?PHQ - 2 Score 1 0 2 0 0  ?Altered sleeping 0 - 3 - -  ?Tired, decreased energy 3 - 3 - -  ?Change in appetite 0 - 1 - -  ?Feeling bad or failure about yourself  0 - 3 - -  ?Trouble concentrating 2 - 1 - -  ?Moving  slowly or fidgety/restless 0 - 0 - -  ?Suicidal thoughts 0 - 0 - -  ?PHQ-9 Score 6 - 13 - -  ?Difficult doing work/chores Somewhat difficult - Not difficult at all - -  ?  ? ?Review of Systems  ?Gastrointestinal:  Positive for constipation.  ?Musculoskeletal:  Positive for gait problem.  ?     Spasms  ?Neurological:  Positive for weakness and numbness.  ?Psychiatric/Behavioral:  Positive for confusion and dysphoric mood. The patient is nervous/anxious.   ?All other systems reviewed and are negative. ? ?   ?Objective:  ? Physical Exam ? ?Gen: no distress, normal appearing, weight 182 lbs, BMI 29.38 ?HEENT: oral mucosa pink and moist, NCAT ?Cardio: Reg rate ?Chest: normal effort, normal rate of breathing ?Abd: soft, non-distended ?Ext: no edema ?Psych: pleasant, normal affect ?Skin: no open lesions on feet, varicose veins of lower extremities ?Neuro: Alert and oriented x3 ?Musculoskeletal: normal gait ?   ?Assessment & Plan:  ?1) Peripheral neuropathy ?-schedule for Qutenza  ?-Discussed Qutenza as an option for neuropathic pain control. Discussed that this is a capsaicin patch, stronger than capsaicin cream. Discussed that it is currently approved for diabetic peripheral neuropathy and post-herpetic neuralgia, but that it has also shown benefit in treating other forms of neuropathy. Provided patient with link to site to learn more about the patch: [employer]. Discussed that the patch would be placed in office and benefits usually last 3 months. Discussed that unintended exposure to capsaicin can cause severe irritation of eyes, mucous membranes, respiratory tract, and skin, but that Qutenza is a local treatment and does not have the systemic side effects of other nerve medications. Discussed that there may be pain, itching, erythema, and decreased sensory function associated with the application of Qutenza. Side effects usually subside within 1 week. A cold pack of analgesic medications can help with  these side effects. Blood pressure can also be increased due to pain associated with administration of the patch.  ?-Provided with a pain relief journal and discussed that it contains foods and lifestyle tips to naturally help to improve pain. Discussed that these lifestyle strategies are also very good for health unlike some medications which can have negative side effects. Discussed that the act of keeping a journal can be therapeutic and helpful to realize patterns what helps to trigger and alleviate pain.   ? ?2) Overweight: ?-Discussed the benefits of intermittent fasting. Recommended starting with pushing dinner 15 minutes earlier and when this feels easy, continuing to push dinner 15 minutes earlier. Discussed that this can  help her body to improve its ability to burn fat rather than glucose, improving insulin sensitivity. Recommended drinking Roobois tea in the evening to help curb appetite and for its numerous health benefits.   ? ?

## 2021-10-23 ENCOUNTER — Encounter (HOSPITAL_BASED_OUTPATIENT_CLINIC_OR_DEPARTMENT_OTHER): Payer: 59 | Admitting: Physical Medicine and Rehabilitation

## 2021-10-23 DIAGNOSIS — R7303 Prediabetes: Secondary | ICD-10-CM | POA: Diagnosis not present

## 2021-10-23 LAB — HEMOGLOBIN A1C
Est. average glucose Bld gHb Est-mCnc: 117 mg/dL
Hgb A1c MFr Bld: 5.7 % — ABNORMAL HIGH (ref 4.8–5.6)

## 2021-10-23 MED ORDER — METFORMIN HCL 500 MG PO TABS: 500.0000 mg | ORAL_TABLET | Freq: Every day | ORAL | 3 refills | Status: DC

## 2021-10-23 NOTE — Progress Notes (Signed)
? ?Subjective:  ? ? Patient ID: [name], female    DOB: 1965-11-28, 56 y.o.   MRN: [id number] ? ?HPI ?An audio/video tele-health visit is felt to be the most appropriate encounter for this patient at this time. This is a follow up tele-visit via phone. The patient is at home. MD is at office. Prior to scheduling this appointment, our staff discussed the limitations of evaluation and management by telemedicine and the availability of in-person appointments. The patient expressed understanding and agreed to proceed.  ? ?[name] is a 75 year year old woman presenting for follow-up of diabetic peripheral neuropathy.  ? ?1) Peripheral neuropathy ?-has been present for a year ?-she is reading my pain journal book and she loves blueberries and cherries ?-she has never been diagnosed with diabetes ?-pain is present in forefoot and top of toes ?-she would like to try Qutenza- felt benefit from patch first day of application.  ? ?2) Overweight ?-she has tried Weight Watchers and lost 15 lbs but this did not help her pain ? ?3) Plantar fasciitis: ?-she was diagnosed with plantar fasciitis and this was treated.  ? ?4) Prediabetes ?-discussed HgbA1c with her ? ?Pain Inventory ?Average Pain 6 ?Pain Right Now 9 ?My pain is constant, burning, and tingling ? ?In the last 24 hours, has pain interfered with the following? ?General activity 5 ?Relation with others 5 ?Enjoyment of life 5 ?What TIME of day is your pain at its worst? night ?Sleep (in general) Poor ? ?Pain is worse with: walking, sitting, inactivity, and standing ?Pain improves with: medication and injections ?Relief from Meds: 5 ? ?walk without assistance ?how many minutes can you walk? unlimited ?ability to climb steps?  yes ?do you drive?  yes ? 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 ?bladder control problems ?weakness ?numbness ?tingling ?trouble walking ?spasms ?confusion ?depression ?anxiety ? ?New pt ? ?New pt ? ? ? ?Family History   ?Problem Relation Age of Onset  ? Hypertension Mother   ? Hyperlipidemia Mother   ? Breast cancer Mother   ? Melanoma Mother   ? Migraines Mother   ? Diabetes type II Mother   ? Lung cancer Father   ? Hypertension Sister   ? Heart attack Maternal Grandmother   ? Heart attack Maternal Grandfather   ? Stroke Maternal Grandfather   ? ?Social History  ? ?Socioeconomic History  ? Marital status: Married  ?  Spouse name: Juanita Streight  ? Number of children: 2  ? Years of education: 39  ? Highest education level: Associate degree: occupational, Hotel manager, or vocational program  ?Occupational History  ? Occupation: Hair stylist  ?Tobacco Use  ? Smoking status: Never  ? Smokeless tobacco: Never  ?Vaping Use  ? Vaping Use: Never used  ?Substance and Sexual Activity  ? Alcohol use: Yes  ?  Alcohol/week: 1.0 standard drink  ?  Types: 1 Glasses of wine per week  ? Drug use: Never  ? Sexual activity: Yes  ?  Partners: Male  ?  Birth control/protection: Surgical  ?Other Topics Concern  ? Not on file  ?Social History Narrative  ? Right-handedness  ? One-story home  ? ?Social Determinants of Health  ? ?Financial Resource Strain: Not on file  ?Food Insecurity: Not on file  ?Transportation Needs: Not on file  ?Physical Activity: Not on file  ?Stress: Not on file  ?Social Connections: Not on file  ? ?Past Surgical History:  ?Procedure Laterality  Date  ? [place] Copas sure ablation  2012  ? rotator cuff surgery  12/2014  ? TONSILLECTOMY    ? as a child  ? ?Past Medical History:  ?Diagnosis Date  ? Atrophy of thyroid (acquired)   ? Autoimmune thyroiditis   ? Chronic fatigue, unspecified   ? Gastro-esophageal reflux disease without esophagitis   ? Generalized anxiety disorder   ? Menopausal and female climacteric states   ? Migraine without aura, not intractable, without status migrainosus   ? Peripheral vascular disease ([place]) 1992  ? blood clot with broken leg, coumadin x 6 mos  ? Vitamin D deficiency, unspecified   ? ?There were no vitals taken  for this visit. ? ?Opioid Risk Score:   ?Fall Risk Score:  `1 ? ?Depression screen PHQ 2/9 ? ?Depression screen [facility] 2/9 10/22/2021 09/29/2021 03/24/2021 12/15/2020 12/15/2020  ?Decreased Interest 0 0 2 0 0  ?Down, Depressed, Hopeless 1 0 0 0 0  ?PHQ - 2 Score 1 0 2 0 0  ?Altered sleeping 0 - 3 - -  ?Tired, decreased energy 3 - 3 - -  ?Change in appetite 0 - 1 - -  ?Feeling bad or failure about yourself  0 - 3 - -  ?Trouble concentrating 2 - 1 - -  ?Moving slowly or fidgety/restless 0 - 0 - -  ?Suicidal thoughts 0 - 0 - -  ?PHQ-9 Score 6 - 13 - -  ?Difficult doing work/chores Somewhat difficult - Not difficult at all - -  ?  ? ?Review of Systems  ?Gastrointestinal:  Positive for constipation.  ?Musculoskeletal:  Positive for gait problem.  ?     Spasms  ?Neurological:  Positive for weakness and numbness.  ?Psychiatric/Behavioral:  Positive for confusion and dysphoric mood. The patient is nervous/anxious.   ?All other systems reviewed and are negative. ? ?   ?Objective:  ? Physical Exam ?Not performed since patient was seen via phone ?   ?Assessment & Plan:  ?1) Peripheral neuropathy ?-schedule for Qutenza  ?-Discussed Qutenza as an option for neuropathic pain control. Discussed that this is a capsaicin patch, stronger than capsaicin cream. Discussed that it is currently approved for diabetic peripheral neuropathy and post-herpetic neuralgia, but that it has also shown benefit in treating other forms of neuropathy. Provided patient with link to site to learn more about the patch: [employer]. Discussed that the patch would be placed in office and benefits usually last 3 months. Discussed that unintended exposure to capsaicin can cause severe irritation of eyes, mucous membranes, respiratory tract, and skin, but that Qutenza is a local treatment and does not have the systemic side effects of other nerve medications. Discussed that there may be pain, itching, erythema, and decreased sensory function associated with  the application of Qutenza. Side effects usually subside within 1 week. A cold pack of analgesic medications can help with these side effects. Blood pressure can also be increased due to pain associated with administration of the patch.  ?-Provided with a pain relief journal and discussed that it contains foods and lifestyle tips to naturally help to improve pain. Discussed that these lifestyle strategies are also very good for health unlike some medications which can have negative side effects. Discussed that the act of keeping a journal can be therapeutic and helpful to realize patterns what helps to trigger and alleviate pain.   ? ?2) Overweight: ?-Discussed the benefits of intermittent fasting. Recommended starting with pushing dinner 15 minutes earlier and when this feels easy,  continuing to push dinner 15 minutes earlier. Discussed that this can help her body to improve its ability to burn fat rather than glucose, improving insulin sensitivity. Recommended drinking Roobois tea in the evening to help curb appetite and for its numerous health benefits.   ? ?3) Prediabetes: ?-discussed hgbA1c is 5.7 which is consistent with prediabetes ?-Discussed the benefits of intermittent fasting. Recommended starting with pushing dinner 15 minutes earlier and when this feels easy, continuing to push dinner 15 minutes earlier. Discussed that this can help her body to improve its ability to burn fat rather than glucose, improving insulin sensitivity. Recommended drinking Roobois tea in the evening to help curb appetite and for its numerous health benefits.   ?-provided dietary education ? ?7 minutes spent in discussion of her HgbA1c, discussion of prediabetes, providing dietary education, discussion of how this can reduce her pain as well, discussed benefits of intermittent fasting and how to do this ? ?

## 2021-10-31 ENCOUNTER — Other Ambulatory Visit: Payer: Self-pay | Admitting: Physician Assistant

## 2021-12-01 ENCOUNTER — Other Ambulatory Visit (INDEPENDENT_AMBULATORY_CARE_PROVIDER_SITE_OTHER): Payer: 59

## 2021-12-01 ENCOUNTER — Other Ambulatory Visit: Payer: Self-pay | Admitting: Internal Medicine

## 2021-12-01 DIAGNOSIS — E063 Autoimmune thyroiditis: Secondary | ICD-10-CM

## 2021-12-01 DIAGNOSIS — E038 Other specified hypothyroidism: Secondary | ICD-10-CM

## 2021-12-01 LAB — TSH: TSH: 0.04 u[IU]/mL — ABNORMAL LOW (ref 0.35–5.50)

## 2021-12-01 LAB — T4, FREE: Free T4: 0.91 ng/dL (ref 0.60–1.60)

## 2021-12-01 MED ORDER — LEVOTHYROXINE SODIUM 75 MCG PO TABS: 75.0000 ug | ORAL_TABLET | Freq: Every day | ORAL | 3 refills | Status: DC

## 2021-12-02 ENCOUNTER — Other Ambulatory Visit: Payer: Self-pay | Admitting: Family Medicine

## 2021-12-02 DIAGNOSIS — E559 Vitamin D deficiency, unspecified: Secondary | ICD-10-CM

## 2021-12-03 ENCOUNTER — Encounter: Payer: Self-pay | Admitting: Internal Medicine

## 2021-12-03 DIAGNOSIS — E038 Other specified hypothyroidism: Secondary | ICD-10-CM

## 2021-12-04 MED ORDER — SYNTHROID 75 MCG PO TABS: 75.0000 ug | ORAL_TABLET | Freq: Every day | ORAL | 1 refills | Status: DC

## 2021-12-06 ENCOUNTER — Other Ambulatory Visit: Payer: Self-pay | Admitting: Physician Assistant

## 2021-12-10 ENCOUNTER — Ambulatory Visit: Payer: 59 | Admitting: Internal Medicine

## 2021-12-10 ENCOUNTER — Encounter: Payer: Self-pay | Admitting: Internal Medicine

## 2021-12-10 VITALS — BP 108/76 | HR 63 | Ht 66.0 in | Wt 178.4 lb

## 2021-12-10 DIAGNOSIS — E038 Other specified hypothyroidism: Secondary | ICD-10-CM | POA: Diagnosis not present

## 2021-12-10 DIAGNOSIS — E559 Vitamin D deficiency, unspecified: Secondary | ICD-10-CM

## 2021-12-10 DIAGNOSIS — R5382 Chronic fatigue, unspecified: Secondary | ICD-10-CM

## 2021-12-10 DIAGNOSIS — E063 Autoimmune thyroiditis: Secondary | ICD-10-CM

## 2021-12-10 LAB — CORTISOL: Cortisol, Plasma: 3.7 ug/dL

## 2021-12-10 NOTE — Patient Instructions (Addendum)
Please come back for labs in 3 weeks. ? ?Continue Levothyroxine 75 mg daily. ? ?Take the thyroid hormone every day, with water, at least 30 minutes before breakfast, separated by at least 4 hours from: ?- acid reflux medications ?- calcium ?- iron ?- multivitamins ? ?Continue vitamin D 5000 units daily. ? ?Stop at the lab. ? ?Please come back for a follow-up appointment in 6 months. ? ?

## 2021-12-10 NOTE — Progress Notes (Addendum)
Patient ID: [name], female   DOB: 1966-03-16, 56 y.o.   MRN: [id number]   This visit occurred during the SARS-CoV-2 public health emergency.  Safety protocols were in place, including screening questions prior to the visit, additional usage of staff PPE, and extensive cleaning of exam room while observing appropriate contact time as indicated for disinfecting solutions.   HPI  [name] is a 56 y.o.-year-old female, initially referred by her PCP, Dr. Ronita Hipps, returning for follow-up for hypothyroidism, diagnosed as Hashimoto's hypothyroidism.  Last visit 1 year ago.  Interim history: She still mentions fatigue and inability to lose weight.  At last visit she weighed 191 pounds.   She had PNA in 07/2021 >> was on steroids then. Not since then. She is on gabapentin for Peripheral neuropathy. She was started on Metformin 500 mg daily 1 mo ago for a HbA1c of 5.7%. She lost 13 lbs since last OV on Weight Watchers - started in 08/2021. She has a little nausea in am.   Reviewed and addended history: Patient was diagnosed with hypothyroidism in 11/2005 >> she initially started on Synthroid d.a.w. 75 mcg daily.  Her hypothyroidism was previously managed by Dr. Ronita Hipps and more recently by her PCP.  Her dose was reduced  from 112 down to 75 mcg daily, dose that she continued at our 1st visit in 05/2019.  At that time, she had several symptoms possibly related to the thyroid so we changed to Armour Thyroid. We initially started 45 mg daily in 05/2019, and we increased the dose to 60 mg daily, and then to 75 milligrams daily.  However, in 05/2021, per her request, we changed to Synthroid d.a.w.,  Initially 125 mcg daily, but then gradually decrease to 75 mcg daily in 11/2021.  She takes Synthroid: - in am - fasting - at least 1-3 hours from b'fast - no Ca, Fe, PPIs - started MVI >4h after LT4 -Previously on biotin 5000 mcg daily, now off.  Reviewed patient's TFTs: Lab Results  Component  Value Date   TSH 0.04 (L) 12/01/2021   TSH 0.02 (L) 10/19/2021   TSH 0.02 (L) 09/14/2021   TSH 0.03 (L) 08/05/2021   TSH 0.89 06/08/2021   TSH 0.17 (L) 04/28/2021   TSH 0.256 (L) 03/24/2021   TSH 0.78 12/08/2020   TSH 3.32 10/30/2020   TSH 2.12 03/31/2020   FREET4 0.91 12/01/2021   FREET4 1.09 10/19/2021   FREET4 1.37 09/14/2021   FREET4 1.22 08/05/2021   FREET4 0.55 (L) 06/08/2021   FREET4 0.63 04/28/2021   FREET4 0.37 (L) 12/08/2020   FREET4 0.32 (L) 10/30/2020   FREET4 0.40 (L) 03/31/2020   FREET4 0.48 (L) 10/25/2019   T3FREE 3.3 08/05/2021   T3FREE 2.2 (L) 06/08/2021   T3FREE 4.1 04/28/2021   T3FREE 2.9 12/08/2020   T3FREE 2.8 10/30/2020   T3FREE 3.0 03/31/2020   T3FREE 2.8 10/25/2019   T3FREE 3.8 09/10/2019   T3FREE 3.2 07/05/2019   T3FREE 2.9 05/24/2019  04/02/2019: TSH 1.81 (0.4-4.0) - on 75 mcg daily 02/15/2018: TSH TSH 0.23 - on 112 mcg daily >> dose decreased to 88 mcg daily  Her antithyroid antibodies were elevated pointing towards a diagnosis of Hashimoto's thyroiditis: Component     Latest Ref Rng 05/24/2019 03/31/2020 12/08/2020  Thyroperoxidase Ab SerPl-aCnc     <9 IU/mL 98 (H)  59 (H)  105 (H)   Thyroglobulin Ab     < or = 1 IU/mL <1  <1  <1  We started selenium 200 mcg daily in 09/2019.  However, antibodies are still elevated on this so I advised her that she can stop selenium.  She describes several symptoms which improved after switching to Armour Thyroid: Fatigue, foggy brain, insomnia, weight gain (50 pounds in 5 years), joint pain, heat and cold intolerance, constipation, dry skin, hair loss, depression.  Pt denies: - feeling nodules in neck - dysphagia - choking - SOB with lying down + pain with swallowing (upper neck)  She has + FH of thyroid disorders in: M aunt and uncle. No FH of thyroid cancer. No h/o radiation tx to head or neck. No recent steroids use.  Previously on thyroid Energy- Ashwaganda, Iodine, L-thyroine -but stopped due to  GERD. On gabapentin 300 mg in am and 600 mg at night prev.>> decreased to 300-600 mg at night only.  She has a history of vitamin D insufficiency, discovered in the setting of generalized joint pains.   She had a mildly low vitamin D level, which improved after starting supplementation: Lab Results  Component Value Date   VD25OH 46.6 09/29/2021   VD25OH 40.0 04/28/2021   VD25OH 28.4 (L) 03/24/2021   VD25OH 17.9 (L) 12/08/2020   VD25OH 58.0 03/31/2020   VD25OH 26.01 (L) 10/25/2019   VD25OH 26.80 (L) 05/24/2019   We started 2000 units vitamin D daily.  We increased the dose to 4000 units daily and then to 5000 units daily.  She continues on this dose today.  Pt. also has a history of anxiety and depression -on clonazepam and Lexapro.  ROS: + See HPI Constitutional: + weight gain, + fatigue Eyes: no blurry vision, + xerophthalmia  I reviewed pt's medications, allergies, PMH, social hx, family hx, and changes were documented in the history of present illness. Otherwise, unchanged from my initial visit note.  Past Medical History:  Diagnosis Date   Atrophy of thyroid (acquired)    Autoimmune thyroiditis    Chronic fatigue, unspecified    Gastro-esophageal reflux disease without esophagitis    Generalized anxiety disorder    Menopausal and female climacteric states    Migraine without aura, not intractable, without status migrainosus    Peripheral vascular disease ([place]) 1992   blood clot with broken leg, coumadin x 6 mos   Vitamin D deficiency, unspecified    Past Surgical History:  Procedure Laterality Date   Nova sure ablation  2012   rotator cuff surgery  12/2014   TONSILLECTOMY     as a child   Social History   Socioeconomic History   Marital status: Married    Spouse name: Not on file   Number of children: 76 -56 years old in 05/2019 she also takes care of her 68-year-old niece   Years of education: Not on file   Highest education level: Not on file  Occupational  History    Hairstylist  Social Needs   Financial resource strain: Not on file   Food insecurity    Worry: Not on file    Inability: Not on file   Transportation needs    Medical: Not on file    Non-medical: Not on file  Tobacco Use   Smoking status: Never Smoker   Smokeless tobacco: Never Used  Substance and Sexual Activity   Alcohol use: Yes    Alcohol/week:     Types:  3 drinks per week-wine or beer   Drug use: No   Sexual activity: Yes    Birth control/protection: Surgical  Comment: husband had vasectomy   Current Outpatient Medications on File Prior to Visit  Medication Sig Dispense Refill   acetaminophen (TYLENOL) 500 MG tablet Take 500 mg by mouth every 6 (six) hours as needed.     albuterol (VENTOLIN HFA) 108 (90 Base) MCG/ACT inhaler Inhale 2 puffs into the lungs 2 (two) times daily. (Patient not taking: Reported on 10/22/2021)     clonazePAM (KLONOPIN) 0.5 MG tablet TAKE 1 TABLET BY MOUTH EVERYDAY AT BEDTIME 30 tablet 0   escitalopram (LEXAPRO) 20 MG tablet TAKE 1 TABLET BY MOUTH EVERY DAY 90 tablet 0   fexofenadine (ALLEGRA) 180 MG tablet Take 180 mg by mouth daily.     gabapentin (NEURONTIN) 300 MG capsule Take 1 capsule AM, 2 capsules PM 270 capsule 3   meloxicam (MOBIC) 15 MG tablet TAKE 1 TABLET (15 MG TOTAL) BY MOUTH DAILY. 30 tablet 3   metFORMIN (GLUCOPHAGE) 500 MG tablet Take 1 tablet (500 mg total) by mouth daily with breakfast. 90 tablet 3   Multiple Vitamin (MULTIVITAMIN) tablet Take 1 tablet by mouth daily.     polyethylene glycol (MIRALAX / GLYCOLAX) 17 g packet Take 17 g by mouth daily.     rosuvastatin (CRESTOR) 5 MG tablet TAKE 1 TABLET (5 MG TOTAL) BY MOUTH DAILY. 90 tablet 0   SUMAtriptan (IMITREX) 100 MG tablet TAKE 1 TABLET BY MOUTH AT ONSET OF MIGRAINE, REPEAT DOSE IN 2 HRS IF NEEDED, MAX 2 TABS IN 24 HRS 9 tablet 2   SYNTHROID 75 MCG tablet Take 1 tablet (75 mcg total) by mouth daily before breakfast. 45 tablet 1   Vitamin D, Ergocalciferol,  (DRISDOL) 1.25 MG (50000 UNIT) CAPS capsule TAKE 1 CAPSULE (50,000 UNITS TOTAL) BY MOUTH EVERY 7 (SEVEN) DAYS 12 capsule 1   No current facility-administered medications on file prior to visit.   No Known Allergies   Family history:  Diabetes in mother and grandmother HTN and HL in mother and maternal aunt Heart disease in mother, maternal grandfather Breast and skin cancer in mother Also, biological father died of cancer  PE: BP 108/76 (BP Location: Left Arm, Patient Position: Sitting, Cuff Size: Normal)   Pulse 63   Ht '5\' 6"'$  (1.676 m)   Wt 178 lb 6.4 oz (80.9 kg)   SpO2 98%   BMI 28.79 kg/m  Wt Readings from Last 3 Encounters:  12/10/21 178 lb 6.4 oz (80.9 kg)  10/22/21 182 lb (82.6 kg)  09/29/21 186 lb 6.4 oz (84.6 kg)   Constitutional: overweight, in NAD Eyes: PERRLA, EOMI, no exophthalmos ENT: moist mucous membranes, no thyromegaly, no cervical lymphadenopathy Cardiovascular: RRR, No MRG Respiratory: CTA B Musculoskeletal: no deformities, strength intact in all 4 Skin: moist, warm, no rashes Neurological: no tremor with outstretched hands, DTR normal in all 4  ASSESSMENT: 1. Hypothyroidism -Diagnosed as Hashimoto's hypothyroidism  2.  Vitamin D insufficiency  3. Fatigue  PLAN:  1. Patient with longstanding hypothyroidism, previously on levothyroxine therapy, then on Armour thyroid, now back on levothyroxine (brand-name Synthroid) per her request.  She is now off selenium, since TPO antibodies still returned elevated at last check. -At last visit, she was on Armour 75 mg daily.  TSH returned suppressed and we switched to levothyroxine, initially 125 mcg daily (05/2021). however, we then had to decrease the dose gradually: to 112 mcg daily (07/2021), then to 100 mcg daily (08/2021), then to 88 mcg daily (10/2021) and, last dose change was to 75 mcg daily (12/01/2021), after the TSH  still returned suppressed: Lab Results  Component Value Date   TSH 0.04 (L)  12/01/2021  -At this visit, we discussed that this is very unusual and I am not sure why she suddenly decreases much less thyroid hormone than before... She is not taking biotin, steroids, or weight loss supplements. -Continues to feel fatigued.  She had intentional weight loss since last visit.  She had some nausea in the morning.  She is trying to do intermittent fasting. - we discussed about taking the thyroid hormone every day, with water, >30 minutes before breakfast, separated by >4 hours from acid reflux medications, calcium, iron, multivitamins. Pt. is taking it correctly. - will check thyroid tests in 3 more weeks: TSH and fT4.  However, at today's visit, I would like to check a TSI antibody titer to screen for Graves' disease - If labs are abnormal, she will need to return for repeat TFTs in 1.5 months - OTW, I will see her back in 6 months  2. vitamin D insufficiency -We checked a vitamin D level for her in the past due to generalized joint pains.  This returned slightly low -We started her on 2000 units vitamin D daily but had to increase to 5000 units daily afterwards, and she continues on this dose today -Her vitamin D normalized at last check: Lab Results  Component Value Date   VD25OH 46.6 09/29/2021  -We will continue the current dose of vitamin D  3. Fatigue -She continues to have fatigue -We discussed that this may be related to menopause.  She cannot use hormone replacement therapy due to previous history of DVT/PE. -At today's visit, she is fasting.  I suggested to check an ACTH and cortisol to screen her for adrenal insufficiency.  Component     Latest Ref Rng 12/10/2021  Cortisol, Plasma     ug/dL 3.7   TSI     <140 % baseline <89   C206 ACTH CANCELED  -specimen leaked in transit!   TSI's are not elevated to suggest Graves' disease.  New thyroid tests are pending for 3 weeks from now. Cortisol is on the low side.  Unfortunately, ACTH test was canceled.  I will  have the patient here for a morning cosyntropin stimulation test.  We will check the ACTH and DHEA-S at that time.  Component     Latest Ref Rng 12/30/2021  TSH     0.35 - 5.50 uIU/mL 0.09 (L)   T4,Free(Direct)     0.60 - 1.60 ng/dL 1.09   Cortisol, Plasma     ug/dL 21.0   Cortisol, Plasma      16.1   Cortisol, Plasma      5.1   C206 ACTH     6 - 50 pg/mL 11   DHEA-Sulfate, LCMS     ug/dL 47     Normal cosyntropin stimulation test, ACTH, and DHEA-S.  However, TSH is still low.  We will need to back off her levothyroxine dose to 50 mcg daily.  Plan to recheck her thyroid tests in 1.5 months.  Philemon Kingdom, MD PhD [facility] Endocrin[facility]

## 2021-12-16 LAB — ACTH

## 2021-12-16 LAB — THYROID STIMULATING IMMUNOGLOBULIN: TSI: <89 % baseline (ref <140)

## 2021-12-25 ENCOUNTER — Other Ambulatory Visit: Payer: 59

## 2021-12-25 ENCOUNTER — Ambulatory Visit: Payer: 59

## 2021-12-30 ENCOUNTER — Ambulatory Visit (INDEPENDENT_AMBULATORY_CARE_PROVIDER_SITE_OTHER): Payer: 59

## 2021-12-30 ENCOUNTER — Other Ambulatory Visit (INDEPENDENT_AMBULATORY_CARE_PROVIDER_SITE_OTHER): Payer: 59

## 2021-12-30 DIAGNOSIS — R5382 Chronic fatigue, unspecified: Secondary | ICD-10-CM | POA: Diagnosis not present

## 2021-12-30 DIAGNOSIS — E063 Autoimmune thyroiditis: Secondary | ICD-10-CM

## 2021-12-30 DIAGNOSIS — E038 Other specified hypothyroidism: Secondary | ICD-10-CM

## 2021-12-30 DIAGNOSIS — E039 Hypothyroidism, unspecified: Secondary | ICD-10-CM | POA: Diagnosis not present

## 2021-12-30 LAB — TSH: TSH: 0.09 u[IU]/mL — ABNORMAL LOW (ref 0.35–5.50)

## 2021-12-30 LAB — CORTISOL
Cortisol, Plasma: 16.1 ug/dL
Cortisol, Plasma: 5.1 ug/dL
Cortisol, Plasma: 21 ug/dL

## 2021-12-30 LAB — T4, FREE: Free T4: 1.09 ng/dL (ref 0.60–1.60)

## 2021-12-30 MED ORDER — COSYNTROPIN 0.25 MG IJ SOLR
0.2500 mg | Freq: Once | INTRAMUSCULAR | Status: AC
  Administered 2021-12-30: 0.25 mg via INTRAVENOUS

## 2021-12-30 NOTE — Progress Notes (Signed)
Patient verbally confirmed name, date of birth, and correct medication to be administered. Cosyntropin injection administered and pt tolerated well.  

## 2022-01-03 LAB — ACTH: C206 ACTH: 11 pg/mL (ref 6–50)

## 2022-01-06 ENCOUNTER — Encounter: Payer: Self-pay | Admitting: Internal Medicine

## 2022-01-06 LAB — DHEA-SULFATE, SERUM: DHEA-Sulfate, LCMS: 47 ug/dL

## 2022-01-06 MED ORDER — SYNTHROID 50 MCG PO TABS
50.0000 ug | ORAL_TABLET | Freq: Every day | ORAL | 3 refills | Status: DC
Start: 2022-01-06 — End: 2022-02-22

## 2022-01-06 NOTE — Addendum Note (Signed)
Addended by: Philemon Kingdom on: 01/06/2022 12:46 PM   Modules accepted: Orders

## 2022-01-07 ENCOUNTER — Other Ambulatory Visit: Payer: 59

## 2022-01-17 ENCOUNTER — Other Ambulatory Visit: Payer: Self-pay | Admitting: Physician Assistant

## 2022-01-17 ENCOUNTER — Other Ambulatory Visit: Payer: Self-pay | Admitting: Internal Medicine

## 2022-02-04 ENCOUNTER — Encounter: Payer: 59 | Attending: Physical Medicine and Rehabilitation | Admitting: Physical Medicine and Rehabilitation

## 2022-02-04 VITALS — BP 128/83 | HR 78 | Ht 66.0 in | Wt 175.0 lb

## 2022-02-04 DIAGNOSIS — R7303 Prediabetes: Secondary | ICD-10-CM | POA: Diagnosis present

## 2022-02-04 DIAGNOSIS — G6289 Other specified polyneuropathies: Secondary | ICD-10-CM | POA: Diagnosis present

## 2022-02-04 DIAGNOSIS — E663 Overweight: Secondary | ICD-10-CM | POA: Insufficient documentation

## 2022-02-04 NOTE — Progress Notes (Signed)
Subjective:    Patient ID: [name], female    DOB: 12/19/1965, 56 y.o.   MRN: [id number]  HPI [name] is a 48 year year old woman presenting for follow-up of diabetic peripheral neuropathy.   1) Peripheral neuropathy -has been present for a year -she is reading my pain journal book and she loves blueberries and cherries -she has never been diagnosed with diabetes -pain is present in forefoot and top of toes -she would like to try Qutenza- felt benefit from patch first day of application. The pain relief lasted about 1.5 months. The first application was only on a portion of the foot, today we did the dorsum and plantar aspects of both feet. She tolerated this without any burning.   2) Overweight -she has tried Weight Watchers and lost 15 lbs but this did not help her pain Current BMI is 28.25 and weight is 175 lbs  3) Plantar fasciitis: -she was diagnosed with plantar fasciitis and this was treated.   4) Prediabetes -discussed HgbA1c with her  Pain Inventory Average Pain 6 Pain Right Now 9 My pain is constant, burning, and tingling  In the last 24 hours, has pain interfered with the following? General activity 5 Relation with others 5 Enjoyment of life 5 What TIME of day is your pain at its worst? night Sleep (in general) Poor  Pain is worse with: walking, sitting, inactivity, and standing Pain improves with: medication and injections Relief from Meds: 5  walk without assistance how many minutes can you walk? unlimited ability to climb steps?  yes do you drive?  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  bladder control problems weakness numbness tingling trouble walking spasms confusion depression anxiety  New pt  New pt    Family History  Problem Relation Age of Onset   Hypertension Mother    Hyperlipidemia Mother    Breast cancer Mother    Melanoma Mother    Migraines Mother    Diabetes type II Mother     Lung cancer Father    Hypertension Sister    Heart attack Maternal Grandmother    Heart attack Maternal Grandfather    Stroke Maternal Grandfather    Social History   Socioeconomic History   Marital status: Married    Spouse name: Camri Cerulli   Number of children: 2   Years of education: 14   Highest education level: Associate degree: occupational, Scientist, product/process development, or vocational program  Occupational History   Occupation: Hair stylist  Tobacco Use   Smoking status: Never   Smokeless tobacco: Never  Vaping Use   Vaping Use: Never used  Substance and Sexual Activity   Alcohol use: Yes    Alcohol/week: 1.0 standard drink of alcohol    Types: 1 Glasses of wine per week   Drug use: Never   Sexual activity: Yes    Partners: Male    Birth control/protection: Surgical  Other Topics Concern   Not on file  Social History Narrative   Right-handedness   One-story home   Social Determinants of Health   Financial Resource Strain: Not on file  Food Insecurity: Not on file  Transportation Needs: Not on file  Physical Activity: Not on file  Stress: Not on file  Social Connections: Not on file   Past Surgical History:  Procedure Laterality Date   Sander Radon sure ablation  2012   rotator cuff surgery  12/2014   TONSILLECTOMY  as a child   Past Medical History:  Diagnosis Date   Atrophy of thyroid (acquired)    Autoimmune thyroiditis    Chronic fatigue, unspecified    Gastro-esophageal reflux disease without esophagitis    Generalized anxiety disorder    Menopausal and female climacteric states    Migraine without aura, not intractable, without status migrainosus    Peripheral vascular disease ([facility]) 1992   blood clot with broken leg, coumadin x 6 mos   Vitamin D deficiency, unspecified    BP 128/83   Pulse 78   Ht 5\' 6"  (1.676 m)   Wt 175 lb (79.4 kg)   SpO2 98%   BMI 28.25 kg/m   Opioid Risk Score:   Fall Risk Score:  `1  Depression screen [facility] 2/9     10/22/2021     9:23 AM 09/29/2021    8:40 AM 03/24/2021    8:17 AM 12/15/2020   10:31 AM 12/15/2020   10:28 AM  Depression screen PHQ 2/9  Decreased Interest 0 0 2 0 0  Down, Depressed, Hopeless 1 0 0 0 0  PHQ - 2 Score 1 0 2 0 0  Altered sleeping 0  3    Tired, decreased energy 3  3    Change in appetite 0  1    Feeling bad or failure about yourself  0  3    Trouble concentrating 2  1    Moving slowly or fidgety/restless 0  0    Suicidal thoughts 0  0    PHQ-9 Score 6  13    Difficult doing work/chores Somewhat difficult  Not difficult at all       Review of Systems  Gastrointestinal:  Positive for constipation.  Musculoskeletal:  Positive for gait problem.       Spasms  Neurological:  Positive for weakness and numbness.  Psychiatric/Behavioral:  Positive for confusion and dysphoric mood. The patient is nervous/anxious.   All other systems reviewed and are negative.      Objective:   Physical Exam Not performed since patient was seen via phone    Assessment & Plan:  1) Peripheral neuropathy -Discussed Qutenza as an option for neuropathic pain control. Discussed that this is a capsaicin patch, stronger than capsaicin cream. Discussed that it is currently approved for diabetic peripheral neuropathy and post-herpetic neuralgia, but that it has also shown benefit in treating other forms of neuropathy. Provided patient with link to site to learn more about the patch: https://www.clark.biz/. Discussed that the patch would be placed in office and benefits usually last 3 months. Discussed that unintended exposure to capsaicin can cause severe irritation of eyes, mucous membranes, respiratory tract, and skin, but that Qutenza is a local treatment and does not have the systemic side effects of other nerve medications. Discussed that there may be pain, itching, erythema, and decreased sensory function associated with the application of Qutenza. Side effects usually subside within 1 week. A cold pack of  analgesic medications can help with these side effects. Blood pressure can also be increased due to pain associated with administration of the patch.   4 patches of Qutenza was applied to the area of pain. Ice packs were applied during the procedure to ensure patient comfort. Blood pressure was monitored every 15 minutes. The patient tolerated the procedure well. Post-procedure instructions were given and follow-up has been scheduled.   -Provided with a pain relief journal and discussed that it contains foods and lifestyle tips to naturally  help to improve pain. Discussed that these lifestyle strategies are also very good for health unlike some medications which can have negative side effects. Discussed that the act of keeping a journal can be therapeutic and helpful to realize patterns what helps to trigger and alleviate pain.    2) Overweight: -Discussed the benefits of intermittent fasting. Recommended starting with pushing dinner 15 minutes earlier and when this feels easy, continuing to push dinner 15 minutes earlier. Discussed that this can help her body to improve its ability to burn fat rather than glucose, improving insulin sensitivity. Recommended drinking Roobois tea in the evening to help curb appetite and for its numerous health benefits.   Current weight is 175 lbs and BMI is 28.25.  3) Prediabetes: -discussed hgbA1c is 5.7 which is consistent with prediabetes -repeat HgbA1c next visit -Discussed the benefits of intermittent fasting. Recommended starting with pushing dinner 15 minutes earlier and when this feels easy, continuing to push dinner 15 minutes earlier. Discussed that this can help her body to improve its ability to burn fat rather than glucose, improving insulin sensitivity. Recommended drinking Roobois tea in the evening to help curb appetite and for its numerous health benefits.   -provided dietary education

## 2022-02-08 ENCOUNTER — Telehealth: Payer: Self-pay

## 2022-02-08 NOTE — Telephone Encounter (Signed)
Both feet are red and burning after Qutenza treatment on last Thursday.

## 2022-02-12 ENCOUNTER — Telehealth: Payer: Self-pay

## 2022-02-12 ENCOUNTER — Other Ambulatory Visit: Payer: Self-pay | Admitting: Physical Medicine and Rehabilitation

## 2022-02-12 MED ORDER — GABAPENTIN 300 MG PO CAPS: ORAL_CAPSULE | ORAL | 3 refills | Status: DC

## 2022-02-12 NOTE — Telephone Encounter (Signed)
Left voicemail to inform patient that medication was sent to pharmacy

## 2022-02-12 NOTE — Telephone Encounter (Signed)
Patient called and wants to go back on the Gabapentin. She states she is not resting and her feet are burning, tingling and in pain. Please advise

## 2022-02-22 ENCOUNTER — Other Ambulatory Visit (INDEPENDENT_AMBULATORY_CARE_PROVIDER_SITE_OTHER): Payer: 59

## 2022-02-22 DIAGNOSIS — E038 Other specified hypothyroidism: Secondary | ICD-10-CM | POA: Diagnosis not present

## 2022-02-22 DIAGNOSIS — E063 Autoimmune thyroiditis: Secondary | ICD-10-CM | POA: Diagnosis not present

## 2022-02-22 LAB — T4, FREE: Free T4: 0.62 ng/dL (ref 0.60–1.60)

## 2022-02-22 LAB — TSH: TSH: 9.42 u[IU]/mL — ABNORMAL HIGH (ref 0.35–5.50)

## 2022-02-22 MED ORDER — SYNTHROID 75 MCG PO TABS: 75.0000 ug | ORAL_TABLET | Freq: Every day | ORAL | 3 refills | Status: DC

## 2022-03-29 ENCOUNTER — Encounter: Payer: Self-pay | Admitting: Internal Medicine

## 2022-03-29 DIAGNOSIS — E039 Hypothyroidism, unspecified: Secondary | ICD-10-CM

## 2022-03-29 MED ORDER — SYNTHROID 75 MCG PO TABS: 75.0000 ug | ORAL_TABLET | Freq: Every day | ORAL | 3 refills | Status: DC

## 2022-04-01 ENCOUNTER — Ambulatory Visit: Payer: 59 | Admitting: Physical Medicine and Rehabilitation

## 2022-04-01 ENCOUNTER — Ambulatory Visit: Payer: 59 | Admitting: Physician Assistant

## 2022-04-01 ENCOUNTER — Encounter: Payer: Self-pay | Admitting: Physician Assistant

## 2022-04-01 VITALS — BP 110/68 | HR 75 | Temp 97.5°F | Ht 66.0 in | Wt 167.6 lb

## 2022-04-01 DIAGNOSIS — E559 Vitamin D deficiency, unspecified: Secondary | ICD-10-CM | POA: Diagnosis not present

## 2022-04-01 DIAGNOSIS — F419 Anxiety disorder, unspecified: Secondary | ICD-10-CM | POA: Diagnosis not present

## 2022-04-01 DIAGNOSIS — R202 Paresthesia of skin: Secondary | ICD-10-CM

## 2022-04-01 DIAGNOSIS — E039 Hypothyroidism, unspecified: Secondary | ICD-10-CM | POA: Diagnosis not present

## 2022-04-01 DIAGNOSIS — R7303 Prediabetes: Secondary | ICD-10-CM

## 2022-04-01 DIAGNOSIS — E782 Mixed hyperlipidemia: Secondary | ICD-10-CM | POA: Diagnosis not present

## 2022-04-01 NOTE — Progress Notes (Signed)
Subjective:  Patient ID: [name], female    DOB: 03/15/1966  Age: 56 y.o. MRN: [id number]  Chief Complaint  Patient presents with   Prediabetes    HPI  Pt with history of hypothyroidism - currently on synthroid 75 mcg and follows with endocrinology Dr De Blanch - has repeat labwork scheduled to recheck TSH in next few weeks  Pt with history of anxiety -  currently stable on medication klonopin and lexapro - she has not had any breakthrough symptoms recently  Pt with history of vit D deficiency - is on supplement and due for labwork  Pt with history of hyperlipidemia - does watch diet - pt states she stopped taking crestor '5mg'$  because she has been watching diet and losing weight  Pt is being followed by specialist for chronic foot pain/neuralgia - she is taking mobic and gabapentin for pain which is being managed at this time however the pain clinic actually checked a hgb a1c on pt and she states the reading was 5.7 and she was started on glucophage ---- I discussed with pt and told her she is not a candidate for glucophage based on that reading and patient states her endocrinologist advised the same thing however she has not stopped the medication Current Outpatient Medications on File Prior to Visit  Medication Sig Dispense Refill   acetaminophen (TYLENOL) 500 MG tablet Take 500 mg by mouth every 6 (six) hours as needed.     albuterol (VENTOLIN HFA) 108 (90 Base) MCG/ACT inhaler Inhale 2 puffs into the lungs 2 (two) times daily.     clonazePAM (KLONOPIN) 0.5 MG tablet TAKE 1 TABLET BY MOUTH EVERYDAY AT BEDTIME 30 tablet 0   escitalopram (LEXAPRO) 20 MG tablet TAKE 1 TABLET BY MOUTH EVERY DAY 90 tablet 0   fexofenadine (ALLEGRA) 180 MG tablet Take 180 mg by mouth daily.     gabapentin (NEURONTIN) 300 MG capsule Take 1 capsule AM, 2 capsules PM 270 capsule 3   meloxicam (MOBIC) 15 MG tablet TAKE 1 TABLET (15 MG TOTAL) BY MOUTH DAILY. 30 tablet 3   Multiple Vitamin (MULTIVITAMIN)  tablet Take 1 tablet by mouth daily.     polyethylene glycol (MIRALAX / GLYCOLAX) 17 g packet Take 17 g by mouth daily.     SUMAtriptan (IMITREX) 100 MG tablet TAKE 1 TABLET BY MOUTH AT ONSET OF MIGRAINE, REPEAT DOSE IN 2 HRS IF NEEDED, MAX 2 TABS IN 24 HRS 9 tablet 2   SYNTHROID 75 MCG tablet Take 1 tablet (75 mcg total) by mouth daily before breakfast. 45 tablet 3   Vitamin D, Ergocalciferol, (DRISDOL) 1.25 MG (50000 UNIT) CAPS capsule TAKE 1 CAPSULE (50,000 UNITS TOTAL) BY MOUTH EVERY 7 (SEVEN) DAYS 12 capsule 1   No current facility-administered medications on file prior to visit.   Past Medical History:  Diagnosis Date   Atrophy of thyroid (acquired)    Autoimmune thyroiditis    Chronic fatigue, unspecified    Gastro-esophageal reflux disease without esophagitis    Generalized anxiety disorder    Menopausal and female climacteric states    Migraine without aura, not intractable, without status migrainosus    Peripheral vascular disease ([place]) 1992   blood clot with broken leg, coumadin x 6 mos   Vitamin D deficiency, unspecified    Past Surgical History:  Procedure Laterality Date   Nova sure ablation  2012   rotator cuff surgery  12/2014   TONSILLECTOMY     as a child  Family History  Problem Relation Age of Onset   Hypertension Mother    Hyperlipidemia Mother    Breast cancer Mother    Melanoma Mother    Migraines Mother    Diabetes type II Mother    Lung cancer Father    Hypertension Sister    Heart attack Maternal Grandmother    Heart attack Maternal Grandfather    Stroke Maternal Grandfather    Social History   Socioeconomic History   Marital status: Married    Spouse name: Baneza Bartoszek   Number of children: 2   Years of education: 14   Highest education level: Associate degree: occupational, Hotel manager, or vocational program  Occupational History   Occupation: Hair stylist  Tobacco Use   Smoking status: Never   Smokeless tobacco: Never  Vaping Use    Vaping Use: Never used  Substance and Sexual Activity   Alcohol use: Yes    Alcohol/week: 1.0 standard drink of alcohol    Types: 1 Glasses of wine per week   Drug use: Never   Sexual activity: Yes    Partners: Male    Birth control/protection: Surgical  Other Topics Concern   Not on file  Social History Narrative   Right-handedness   One-story home   Social Determinants of Health   Financial Resource Strain: Not on file  Food Insecurity: Not on file  Transportation Needs: Not on file  Physical Activity: Not on file  Stress: Not on file  Social Connections: Not on file    CONSTITUTIONAL: Negative for chills, fatigue, fever, unintentional weight gain and unintentional weight loss.  E/N/T: Negative for ear pain, nasal congestion and sore throat.  CARDIOVASCULAR: Negative for chest pain, dizziness, palpitations and pedal edema.  RESPIRATORY: Negative for recent cough and dyspnea.  GASTROINTESTINAL: Negative for abdominal pain, acid reflux symptoms, constipation, diarrhea, nausea and vomiting.  MSK: Negative for arthralgias and myalgias.  INTEGUMENTARY: Negative for rash.  NEUROLOGICAL:see HPI PSYCHIATRIC: Negative for sleep disturbance and to question depression screen.  Negative for depression, negative for anhedonia.           Objective:  PHYSICAL EXAM:   VS: BP 110/68 (BP Location: Left Arm, Patient Position: Sitting, Cuff Size: Large)   Pulse 75   Temp (!) 97.5 F (36.4 C) (Temporal)   Ht '5\' 6"'$  (1.676 m)   Wt 167 lb 9.6 oz (76 kg)   SpO2 98%   BMI 27.05 kg/m   GEN: Well nourished, well developed, in no acute distress   Cardiac: RRR; no murmurs, rubs, or gallops,no edema -  Respiratory:  normal respiratory rate and pattern with no distress - normal breath sounds with no rales, rhonchi, wheezes or rubs  MS: no deformity or atrophy  Skin: warm and dry, no rash   Psych: euthymic mood, appropriate affect and demeanor   Lab Results  Component Value Date    WBC 6.5 09/29/2021   HGB 12.7 09/29/2021   HCT 38.7 09/29/2021   PLT 273 09/29/2021   GLUCOSE 95 09/29/2021   CHOL 140 09/29/2021   TRIG 60 09/29/2021   HDL 52 09/29/2021   LDLCALC 75 09/29/2021   ALT 17 09/29/2021   AST 16 09/29/2021   NA 145 (H) 09/29/2021   K 4.6 09/29/2021   CL 108 (H) 09/29/2021   CREATININE 0.63 09/29/2021   BUN 11 09/29/2021   CO2 25 09/29/2021   TSH 9.42 (H) 02/22/2022   HGBA1C 5.7 (H) 10/22/2021      Assessment &  Plan:   Problem List Items Addressed This Visit       Endocrine   Acquired hypothyroidism Continue follow up with endocrinologist     Other   Vitamin D insufficiency   Relevant Orders   VITAMIN D 25 Hydroxy (Vit-D Deficiency, Fractures)   Other Visit Diagnoses     Neuralgia    -  Primary   Relevant Orders   CBC with Differential/Platelet   Comprehensive metabolic panel Continue gabapentin   Anxiety     Continue current meds   Mixed hyperlipidemia       Relevant Orders   CBC with Differential/Platelet   Comprehensive metabolic panel   Lipid panel  Prediabetes Continue to watch diet Stop glucophage     .  No orders of the defined types were placed in this encounter.   Orders Placed This Encounter  Procedures   CBC with Differential/Platelet   Comprehensive metabolic panel   Lipid panel   Hemoglobin A1c   VITAMIN D 25 Hydroxy (Vit-D Deficiency, Fractures)     Follow-up: Return in about 6 months (around 10/02/2022) for chronic fasting follow up.  An After Visit Summary was printed and given to the patient.  Yetta Flock [facility] [phone]

## 2022-04-02 LAB — COMPREHENSIVE METABOLIC PANEL
ALT: 13 IU/L (ref 0–32)
AST: 18 IU/L (ref 0–40)
Albumin/Globulin Ratio: 2.3 — ABNORMAL HIGH (ref 1.2–2.2)
Albumin: 4.5 g/dL (ref 3.8–4.9)
Alkaline Phosphatase: 68 IU/L (ref 44–121)
BUN/Creatinine Ratio: 16 (ref 9–23)
BUN: 11 mg/dL (ref 6–24)
Bilirubin Total: 0.5 mg/dL (ref 0.0–1.2)
CO2: 27 mmol/L (ref 20–29)
Calcium: 9.6 mg/dL (ref 8.7–10.2)
Chloride: 104 mmol/L (ref 96–106)
Creatinine, Ser: 0.68 mg/dL (ref 0.57–1.00)
Globulin, Total: 2 g/dL (ref 1.5–4.5)
Glucose: 93 mg/dL (ref 70–99)
Potassium: 4.6 mmol/L (ref 3.5–5.2)
Sodium: 140 mmol/L (ref 134–144)
Total Protein: 6.5 g/dL (ref 6.0–8.5)
eGFR: 103 mL/min/{1.73_m2} (ref >59)

## 2022-04-02 LAB — HEMOGLOBIN A1C
Est. average glucose Bld gHb Est-mCnc: 108 mg/dL
Hgb A1c MFr Bld: 5.4 % (ref 4.8–5.6)

## 2022-04-02 LAB — CBC WITH DIFFERENTIAL/PLATELET
Basophils Absolute: 0 10*3/uL (ref 0.0–0.2)
Basos: 1 %
EOS (ABSOLUTE): 0.1 10*3/uL (ref 0.0–0.4)
Eos: 2 %
Hematocrit: 39.7 % (ref 34.0–46.6)
Hemoglobin: 13.4 g/dL (ref 11.1–15.9)
Immature Grans (Abs): 0 10*3/uL (ref 0.0–0.1)
Immature Granulocytes: 0 %
Lymphocytes Absolute: 1.9 10*3/uL (ref 0.7–3.1)
Lymphs: 31 %
MCH: 30.9 pg (ref 26.6–33.0)
MCHC: 33.8 g/dL (ref 31.5–35.7)
MCV: 92 fL (ref 79–97)
Monocytes Absolute: 0.5 10*3/uL (ref 0.1–0.9)
Monocytes: 7 %
Neutrophils Absolute: 3.6 10*3/uL (ref 1.4–7.0)
Neutrophils: 59 %
Platelets: 287 10*3/uL (ref 150–450)
RBC: 4.33 x10E6/uL (ref 3.77–5.28)
RDW: 13 % (ref 11.7–15.4)
WBC: 6.2 10*3/uL (ref 3.4–10.8)

## 2022-04-02 LAB — LIPID PANEL
Chol/HDL Ratio: 3.9 ratio (ref 0.0–4.4)
Cholesterol, Total: 201 mg/dL — ABNORMAL HIGH (ref 100–199)
HDL: 51 mg/dL (ref >39)
LDL Chol Calc (NIH): 128 mg/dL — ABNORMAL HIGH (ref 0–99)
Triglycerides: 121 mg/dL (ref 0–149)
VLDL Cholesterol Cal: 22 mg/dL (ref 5–40)

## 2022-04-02 LAB — VITAMIN D 25 HYDROXY (VIT D DEFICIENCY, FRACTURES): Vit D, 25-Hydroxy: 66.3 ng/mL (ref 30.0–100.0)

## 2022-04-02 LAB — CARDIOVASCULAR RISK ASSESSMENT

## 2022-04-12 ENCOUNTER — Other Ambulatory Visit (INDEPENDENT_AMBULATORY_CARE_PROVIDER_SITE_OTHER): Payer: 59

## 2022-04-12 DIAGNOSIS — E063 Autoimmune thyroiditis: Secondary | ICD-10-CM

## 2022-04-12 DIAGNOSIS — E038 Other specified hypothyroidism: Secondary | ICD-10-CM | POA: Diagnosis not present

## 2022-04-12 LAB — T4, FREE: Free T4: 0.81 ng/dL (ref 0.60–1.60)

## 2022-04-12 LAB — TSH: TSH: 1.56 u[IU]/mL (ref 0.35–5.50)

## 2022-04-13 ENCOUNTER — Ambulatory Visit: Payer: 59 | Admitting: Podiatry

## 2022-04-15 ENCOUNTER — Ambulatory Visit: Payer: 59 | Admitting: Podiatry

## 2022-04-20 ENCOUNTER — Other Ambulatory Visit: Payer: Self-pay | Admitting: Physician Assistant

## 2022-04-22 ENCOUNTER — Telehealth: Payer: Self-pay | Admitting: Podiatry

## 2022-04-22 NOTE — Telephone Encounter (Signed)
Pt called to check and make sure the nerve biopsy kit is in as she has an appt 9.12.2023 with Dr Milinda Pointer.

## 2022-04-23 ENCOUNTER — Other Ambulatory Visit: Payer: Self-pay | Admitting: Physician Assistant

## 2022-04-26 MED ORDER — ESCITALOPRAM OXALATE 20 MG PO TABS: 20.0000 mg | ORAL_TABLET | Freq: Every day | ORAL | 0 refills | Status: DC

## 2022-04-27 ENCOUNTER — Ambulatory Visit: Payer: 59 | Admitting: Podiatry

## 2022-04-29 ENCOUNTER — Ambulatory Visit: Payer: 59 | Admitting: Physician Assistant

## 2022-05-03 ENCOUNTER — Other Ambulatory Visit: Payer: Self-pay | Admitting: Physician Assistant

## 2022-05-03 ENCOUNTER — Ambulatory Visit: Payer: 59 | Admitting: Podiatry

## 2022-05-03 DIAGNOSIS — E559 Vitamin D deficiency, unspecified: Secondary | ICD-10-CM

## 2022-05-06 ENCOUNTER — Ambulatory Visit: Payer: 59 | Admitting: Podiatry

## 2022-05-06 DIAGNOSIS — G5793 Unspecified mononeuropathy of bilateral lower limbs: Secondary | ICD-10-CM

## 2022-05-06 DIAGNOSIS — G5791 Unspecified mononeuropathy of right lower limb: Secondary | ICD-10-CM | POA: Diagnosis not present

## 2022-05-06 NOTE — Progress Notes (Signed)
She presents today for rebiopsy of her epidermal nerve fibers requested by her physician.  She still having a hard time finding out whether or not she actually has neuropathy and where her pain is coming from.  Objective: Vitals are stable alert oriented x3 no change in physical exam pulses are palpable.  Assessment: Neuropathy.  Plan: At this point after sterile Betadine skin prep I injected local anesthetic above the lateral malleolus and took a sample of the skin per protocol and placed them in their individual vials to be sent for evaluation.  Dressel dressings were placed.  I will follow-up with her in a few weeks just to make sure she is doing well.

## 2022-05-07 ENCOUNTER — Encounter: Payer: Self-pay | Admitting: Physician Assistant

## 2022-05-07 ENCOUNTER — Ambulatory Visit: Payer: 59 | Admitting: Physician Assistant

## 2022-05-07 VITALS — BP 108/66 | HR 66 | Temp 97.2°F | Ht 66.0 in | Wt 165.2 lb

## 2022-05-07 DIAGNOSIS — R0789 Other chest pain: Secondary | ICD-10-CM | POA: Diagnosis not present

## 2022-05-07 LAB — CBC WITH DIFFERENTIAL/PLATELET
Basophils Absolute: 0.1 10*3/uL (ref 0.0–0.2)
Basos: 1 %
EOS (ABSOLUTE): 0.1 10*3/uL (ref 0.0–0.4)
Eos: 2 %
Hematocrit: 40.5 % (ref 34.0–46.6)
Hemoglobin: 13.5 g/dL (ref 11.1–15.9)
Immature Grans (Abs): 0 10*3/uL (ref 0.0–0.1)
Immature Granulocytes: 0 %
Lymphocytes Absolute: 2.4 10*3/uL (ref 0.7–3.1)
Lymphs: 35 %
MCH: 30.5 pg (ref 26.6–33.0)
MCHC: 33.3 g/dL (ref 31.5–35.7)
MCV: 92 fL (ref 79–97)
Monocytes Absolute: 0.6 10*3/uL (ref 0.1–0.9)
Monocytes: 9 %
Neutrophils Absolute: 3.8 10*3/uL (ref 1.4–7.0)
Neutrophils: 53 %
Platelets: 259 10*3/uL (ref 150–450)
RBC: 4.42 x10E6/uL (ref 3.77–5.28)
RDW: 12.7 % (ref 11.7–15.4)
WBC: 7 10*3/uL (ref 3.4–10.8)

## 2022-05-07 LAB — COMPREHENSIVE METABOLIC PANEL
ALT: 17 IU/L (ref 0–32)
AST: 21 IU/L (ref 0–40)
Albumin/Globulin Ratio: 2 (ref 1.2–2.2)
Albumin: 4.7 g/dL (ref 3.8–4.9)
Alkaline Phosphatase: 65 IU/L (ref 44–121)
BUN/Creatinine Ratio: 20 (ref 9–23)
BUN: 13 mg/dL (ref 6–24)
Bilirubin Total: 0.4 mg/dL (ref 0.0–1.2)
CO2: 24 mmol/L (ref 20–29)
Calcium: 9.7 mg/dL (ref 8.7–10.2)
Chloride: 103 mmol/L (ref 96–106)
Creatinine, Ser: 0.65 mg/dL (ref 0.57–1.00)
Globulin, Total: 2.4 g/dL (ref 1.5–4.5)
Glucose: 92 mg/dL (ref 70–99)
Potassium: 4.6 mmol/L (ref 3.5–5.2)
Sodium: 141 mmol/L (ref 134–144)
Total Protein: 7.1 g/dL (ref 6.0–8.5)
eGFR: 104 mL/min/{1.73_m2} (ref >59)

## 2022-05-07 MED ORDER — ASPIRIN 81 MG PO TBEC: 81.0000 mg | DELAYED_RELEASE_TABLET | Freq: Every day | ORAL | 12 refills | Status: DC

## 2022-05-07 MED ORDER — NITROGLYCERIN 0.4 MG SL SUBL: 0.4000 mg | SUBLINGUAL_TABLET | SUBLINGUAL | 1 refills | Status: DC | PRN

## 2022-05-07 MED ORDER — METOPROLOL SUCCINATE ER 25 MG PO TB24: 25.0000 mg | ORAL_TABLET | Freq: Every day | ORAL | 0 refills | Status: DC

## 2022-05-07 NOTE — Progress Notes (Signed)
Subjective:  Patient ID: [name], female    DOB: Aug 13, 1966  Age: 56 y.o. MRN: [id number]  Chief Complaint  Patient presents with   Chest Pain    HPI  Pt in today with complaints of intermittent episodes of chest discomfort in left anterior chest that radiates to left arm.  States can happen with activity or with rest.  This has been going on for a few months but 3 weeks ago had severe pain and discomfort that lasted several hours then 2 weeks ago had another episode of chest pain with arm pain as well.  She was advised at that time to seek emergent care but she never did - Current Outpatient Medications on File Prior to Visit  Medication Sig Dispense Refill   acetaminophen (TYLENOL) 500 MG tablet Take 500 mg by mouth every 6 (six) hours as needed.     clonazePAM (KLONOPIN) 0.5 MG tablet TAKE 1 TABLET BY MOUTH EVERYDAY AT BEDTIME 30 tablet 0   escitalopram (LEXAPRO) 20 MG tablet Take 1 tablet (20 mg total) by mouth daily. 90 tablet 0   fexofenadine (ALLEGRA) 180 MG tablet Take 180 mg by mouth daily.     gabapentin (NEURONTIN) 300 MG capsule Take 1 capsule AM, 2 capsules PM 270 capsule 3   Multiple Vitamin (MULTIVITAMIN) tablet Take 1 tablet by mouth daily.     polyethylene glycol (MIRALAX / GLYCOLAX) 17 g packet Take 17 g by mouth daily.     SUMAtriptan (IMITREX) 100 MG tablet TAKE 1 TABLET BY MOUTH AT ONSET OF MIGRAINE, REPEAT DOSE IN 2 HRS IF NEEDED, MAX 2 TABS IN 24 HRS 9 tablet 2   SYNTHROID 75 MCG tablet Take 1 tablet (75 mcg total) by mouth daily before breakfast. 45 tablet 3   Vitamin D, Ergocalciferol, (DRISDOL) 1.25 MG (50000 UNIT) CAPS capsule TAKE 1 CAPSULE (50,000 UNITS TOTAL) BY MOUTH EVERY 7 (SEVEN) DAYS 12 capsule 1   No current facility-administered medications on file prior to visit.   Past Medical History:  Diagnosis Date   Atrophy of thyroid (acquired)    Autoimmune thyroiditis    Chronic fatigue, unspecified    Gastro-esophageal reflux disease without  esophagitis    Generalized anxiety disorder    Menopausal and female climacteric states    Migraine without aura, not intractable, without status migrainosus    Peripheral vascular disease ([place]) 1992   blood clot with broken leg, coumadin x 6 mos   Vitamin D deficiency, unspecified    Past Surgical History:  Procedure Laterality Date   Nova sure ablation  2012   rotator cuff surgery  12/2014   TONSILLECTOMY     as a child    Family History  Problem Relation Age of Onset   Hypertension Mother    Hyperlipidemia Mother    Breast cancer Mother    Melanoma Mother    Migraines Mother    Diabetes type II Mother    Lung cancer Father    Hypertension Sister    Heart attack Maternal Grandmother    Heart attack Maternal Grandfather    Stroke Maternal Grandfather    Social History   Socioeconomic History   Marital status: Married    Spouse name: Siddalee Vanderheiden   Number of children: 2   Years of education: 14   Highest education level: Associate degree: occupational, Hotel manager, or vocational program  Occupational History   Occupation: Hair stylist  Tobacco Use   Smoking status: Never   Smokeless tobacco:  Never  Vaping Use   Vaping Use: Never used  Substance and Sexual Activity   Alcohol use: Yes    Alcohol/week: 1.0 standard drink of alcohol    Types: 1 Glasses of wine per week   Drug use: Never   Sexual activity: Yes    Partners: Male    Birth control/protection: Surgical  Other Topics Concern   Not on file  Social History Narrative   Right-handedness   One-story home   Social Determinants of Health   Financial Resource Strain: Not on file  Food Insecurity: Not on file  Transportation Needs: Not on file  Physical Activity: Not on file  Stress: Not on file  Social Connections: Not on file    Review of Systems CONSTITUTIONAL: Negative for chills, fatigue, fever, unintentional weight gain and unintentional weight loss.  E/N/T: Negative for ear pain, nasal  congestion and sore throat.  CARDIOVASCULAR: see HPI RESPIRATORY: Negative for recent cough and dyspnea.  GASTROINTESTINAL: Negative for abdominal pain, acid reflux symptoms, constipation, diarrhea, nausea and vomiting.  INTEGUMENTARY: Negative for rash.  NEUROLOGICAL: Negative for dizziness and headaches.  PSYCHIATRIC: Negative for sleep disturbance and to question depression screen.  Negative for depression, negative for anhedonia.       Objective:  PHYSICAL EXAM:   VS: BP 108/66 (BP Location: Left Arm, Patient Position: Sitting, Cuff Size: Normal)   Pulse 66   Temp (!) 97.2 F (36.2 C) (Temporal)   Ht '5\' 6"'$  (1.676 m)   Wt 165 lb 3.2 oz (74.9 kg)   SpO2 98%   BMI 26.66 kg/m   GEN: Well nourished, well developed, in no acute distress  Cardiac: RRR; no murmurs, rubs, or gallops,no edema - Respiratory:  normal respiratory rate and pattern with no distress - normal breath sounds with no rales, rhonchi, wheezes or rubs Skin: warm and dry, no rash  Neuro:  Alert and Oriented x 3,- CN II-Xii grossly intact Psych: euthymic mood, appropriate affect and demeanor  EKG - no acute changes noted Lab Results  Component Value Date   WBC 6.2 04/01/2022   HGB 13.4 04/01/2022   HCT 39.7 04/01/2022   PLT 287 04/01/2022   GLUCOSE 93 04/01/2022   CHOL 201 (H) 04/01/2022   TRIG 121 04/01/2022   HDL 51 04/01/2022   LDLCALC 128 (H) 04/01/2022   ALT 13 04/01/2022   AST 18 04/01/2022   NA 140 04/01/2022   K 4.6 04/01/2022   CL 104 04/01/2022   CREATININE 0.68 04/01/2022   BUN 11 04/01/2022   CO2 27 04/01/2022   TSH 1.56 04/12/2022   HGBA1C 5.4 04/01/2022      Assessment & Plan:   Problem List Items Addressed This Visit   None Visit Diagnoses     Other chest pain    -  Primary   Relevant Medications   metoprolol succinate (TOPROL-XL) 25 MG 24 hr tablet   nitroGLYCERIN (NITROSTAT) 0.4 MG SL tablet   aspirin EC 81 MG tablet   Other Relevant Orders   EKG 12-Lead (Completed)    CBC with Differential/Platelet   Comprehensive metabolic panel   Ambulatory referral to Cardiology     .  Meds ordered this encounter  Medications   metoprolol succinate (TOPROL-XL) 25 MG 24 hr tablet    Sig: Take 1 tablet (25 mg total) by mouth daily.    Dispense:  30 tablet    Refill:  0    Order Specific Question:   Supervising Provider  Answer:   COXLynder Parents   nitroGLYCERIN (NITROSTAT) 0.4 MG SL tablet    Sig: Place 1 tablet (0.4 mg total) under the tongue every 5 (five) minutes as needed for chest pain. Maxiumum up to 3 doses    Dispense:  25 tablet    Refill:  1    Order Specific Question:   Supervising Provider    Answer:   Shelton Silvas   aspirin EC 81 MG tablet    Sig: Take 1 tablet (81 mg total) by mouth daily. Swallow whole.    Dispense:  30 tablet    Refill:  12    Order Specific Question:   Supervising Provider    AnswerRochel Brome [phone]    Orders Placed This Encounter  Procedures   CBC with Differential/Platelet   Comprehensive metabolic panel   Ambulatory referral to Cardiology   EKG 12-Lead     Follow-up: Return in about 6 weeks (around 06/18/2022) for follow up.  An After Visit Summary was printed and given to the patient.  Yetta Flock [facility] [phone]

## 2022-05-10 ENCOUNTER — Telehealth: Payer: Self-pay | Admitting: Podiatry

## 2022-05-10 ENCOUNTER — Encounter: Payer: Self-pay | Admitting: Internal Medicine

## 2022-05-10 ENCOUNTER — Ambulatory Visit: Payer: 59 | Attending: Internal Medicine | Admitting: Internal Medicine

## 2022-05-10 VITALS — BP 86/60 | HR 61 | Ht 65.0 in | Wt 165.2 lb

## 2022-05-10 DIAGNOSIS — R0789 Other chest pain: Secondary | ICD-10-CM | POA: Diagnosis not present

## 2022-05-10 NOTE — Progress Notes (Signed)
Cardiology Office Note:    Date:  05/10/2022   ID:  [name], DOB 1966-03-15, MRN [id number]  PCP:  [name], [name] Providers Cardiologist:  None     Referring MD: Kaylee Duncans, PA-C   No chief complaint on file. CP  History of Present Illness:    [name] is a 56 y.o. female with a hx of thryoid dx on synthroid, chronic fatigue, GAD, BP on the soft side today.  She notes 2 months ago she has noted CP on the L side. A few Saturdays ago, she had L chest pain that radiated to the L side. She was cleaning up the house. She took antacid and that did not help. She's had a few episodes. Her PCP gave her SL nitro.  She took the nitro last night, it helped a little. She notes she still has some discomfort this AM. She notes pain with stress. Years ago she was told she had ? prolapse valve in her 60s. TSH is normal in August.   She has no cardiac disease dx. Non smoker. He denies syncope or palpitations. He denies PND/orthopnea/LE edema.   Past Medical History:  Diagnosis Date   Atrophy of thyroid (acquired)    Autoimmune thyroiditis    Chronic fatigue, unspecified    Gastro-esophageal reflux disease without esophagitis    Generalized anxiety disorder    Menopausal and female climacteric states    Migraine without aura, not intractable, without status migrainosus    Peripheral vascular disease ([place]) 1992   blood clot with broken leg, coumadin x 6 mos   Vitamin D deficiency, unspecified     Past Surgical History:  Procedure Laterality Date   Nova sure ablation  2012   rotator cuff surgery  12/2014   TONSILLECTOMY     as a child    Current Medications: No outpatient medications have been marked as taking for the 05/10/22 encounter (Appointment) with Janina Mayo, MD.     Allergies:   Patient has no known allergies.   Social History   Socioeconomic History   Marital status: Married    Spouse name: [name]   Number of children: 2    Years of education: 14   Highest education level: Associate degree: occupational, Hotel manager, or vocational program  Occupational History   Occupation: Hair stylist  Tobacco Use   Smoking status: Never   Smokeless tobacco: Never  Vaping Use   Vaping Use: Never used  Substance and Sexual Activity   Alcohol use: Yes    Alcohol/week: 1.0 standard drink of alcohol    Types: 1 Glasses of wine per week   Drug use: Never   Sexual activity: Yes    Partners: Male    Birth control/protection: Surgical  Other Topics Concern   Not on file  Social History Narrative   Right-handedness   One-story home   Social Determinants of Health   Financial Resource Strain: Not on file  Food Insecurity: Not on file  Transportation Needs: Not on file  Physical Activity: Not on file  Stress: Not on file  Social Connections: Not on file     Family History: The patient's family history includes Breast cancer in her mother; Diabetes type II in her mother; Heart attack in her maternal grandfather and maternal grandmother; Hyperlipidemia in her mother; Hypertension in her mother and sister; Lung cancer in her father; Melanoma in her mother; Migraines in her mother; Stroke in her maternal  grandfather. Mother has PCIs.   ROS:   Please see the history of present illness.     All other systems reviewed and are negative.  EKGs/Labs/Other Studies Reviewed:    The following studies were reviewed today:   EKG:  EKG is  ordered today.  The ekg ordered today demonstrates   05/10/2022- NSR  Recent Labs: 04/12/2022: TSH 1.56 05/07/2022: ALT 17; BUN 13; Creatinine, Ser 0.65; Hemoglobin 13.5; Platelets 259; Potassium 4.6; Sodium 141  Recent Lipid Panel    Component Value Date/Time   CHOL 201 (H) 04/01/2022 0932   TRIG 121 04/01/2022 0932   HDL 51 04/01/2022 0932   CHOLHDL 3.9 04/01/2022 0932   LDLCALC 128 (H) 04/01/2022 0932     Risk Assessment/Calculations:    Physical Exam:    VS:  Vitals:    05/10/22 1523  BP: (!) 86/60  Pulse: 61  SpO2: 95%     Wt Readings from Last 3 Encounters:  05/07/22 165 lb 3.2 oz (74.9 kg)  04/01/22 167 lb 9.6 oz (76 kg)  02/04/22 175 lb (79.4 kg)     GEN:  Well nourished, well developed in no acute distress HEENT: Normal NECK: No JVD; No carotid bruits LYMPHATICS: No lymphadenopathy CARDIAC: RRR, no murmurs, rubs, gallops RESPIRATORY:  Clear to auscultation without rales, wheezing or rhonchi  ABDOMEN: Soft, non-tender, non-distended MUSCULOSKELETAL:  No edema; No deformity  SKIN: Warm and dry NEUROLOGIC:  Alert and oriented x 3 PSYCHIATRIC:  Normal affect   ASSESSMENT:    Possible cardiac CP: She notes L sided radiating CP with activity CCS II. Her risk is low however. Will get an exercise SPECT. BP are soft, if no ischemia can stop nitro and BB.  ?Prolapse: TTE  PLAN:    In order of problems listed above:  Exercise SPECT TTE Follow up as needed      Shared Decision Making/Informed Consent The risks [chest pain, shortness of breath, cardiac arrhythmias, dizziness, blood pressure fluctuations, myocardial infarction, stroke/transient ischemic attack, nausea, vomiting, allergic reaction, radiation exposure, metallic taste sensation and life-threatening complications (estimated to be 1 in 10,000)], benefits (risk stratification, diagnosing coronary artery disease, treatment guidance) and alternatives of a nuclear stress test were discussed in detail with [name] and she agrees to proceed.    Medication Adjustments/Labs and Tests Ordered: Current medicines are reviewed at length with the patient today.  Concerns regarding medicines are outlined above.  No orders of the defined types were placed in this encounter.  No orders of the defined types were placed in this encounter.   There are no Patient Instructions on file for this visit.   Signed, Janina Mayo, MD  05/10/2022 11:40 AM    [place]

## 2022-05-10 NOTE — Patient Instructions (Signed)
Medication Instructions:  Your physician recommends that you continue on your current medications as directed. Please refer to the Current Medication list given to you today.  *If you need a refill on your cardiac medications before your next appointment, please call your pharmacy*   Testing/Procedures: Your physician has requested that you have an echocardiogram. Echocardiography is a painless test that uses sound waves to create images of your heart. It provides your doctor with information about the size and shape of your heart and how well your heart's chambers and valves are working. This procedure takes approximately one hour. There are no restrictions for this procedure. This procedure will be done at [address] [place]   Dr. Harl Bowie has ordered a Myocardial Perfusion Imaging Study.   The test will take approximately 3 to 4 hours to complete; you may bring reading material.  If someone comes with you to your appointment, they will need to remain in the main lobby due to limited space in the testing area. **If you are pregnant or breastfeeding, please notify the nuclear lab prior to your appointment**  You will need to hold the following medications prior to your stress test: beta-blockers (24 hours prior to test)   How to prepare for your Myocardial Perfusion Test: Do not eat or drink 3 hours prior to your test, except you may have water. Do not consume products containing caffeine (regular or decaffeinated) 12 hours prior to your test. (ex: coffee, chocolate, sodas, tea). Do wear comfortable clothes (no dresses or overalls) and walking shoes, tennis shoes preferred (No heels or open toe shoes are allowed). Do NOT wear cologne, perfume, aftershave, or lotions (deodorant is allowed). If these instructions are not followed, your test will have to be rescheduled.    Follow-Up: At [facility], you and your health needs are our priority.  As part of our continuing mission  to provide you with exceptional heart care, we have created designated Provider Care Teams.  These Care Teams include your primary Cardiologist (physician) and Advanced Practice Providers (APPs -  Physician Assistants and Nurse Practitioners) who all work together to provide you with the care you need, when you need it.  We recommend signing up for the patient portal called "MyChart".  Sign up information is provided on this After Visit Summary.  MyChart is used to connect with patients for Virtual Visits (Telemedicine).  Patients are able to view lab/test results, encounter notes, upcoming appointments, etc.  Non-urgent messages can be sent to your provider as well.   To learn more about what you can do with MyChart, go to [email or web address].    Your next appointment:   We will see you on an as needed basis.  Provider:   Phineas Inches, MD

## 2022-05-10 NOTE — Telephone Encounter (Signed)
Received call from [facility] and they needed to know what office the specimen was taken at and I verified it was done in the [place] office.

## 2022-05-13 ENCOUNTER — Telehealth (HOSPITAL_COMMUNITY): Payer: Self-pay | Admitting: *Deleted

## 2022-05-13 ENCOUNTER — Ambulatory Visit (HOSPITAL_BASED_OUTPATIENT_CLINIC_OR_DEPARTMENT_OTHER): Payer: 59

## 2022-05-13 DIAGNOSIS — R0789 Other chest pain: Secondary | ICD-10-CM

## 2022-05-13 LAB — ECHOCARDIOGRAM COMPLETE
Area-P 1/2: 4.04 cm2
S' Lateral: 2.9 cm

## 2022-05-13 NOTE — Telephone Encounter (Signed)
Close encounter 

## 2022-05-14 ENCOUNTER — Ambulatory Visit (HOSPITAL_COMMUNITY)
Admission: RE | Admit: 2022-05-14 | Discharge: 2022-05-14 | Disposition: A | Discharge status: Home / Self Care | Payer: 59 | Source: Ambulatory Visit | Attending: Cardiovascular Disease | Admitting: Cardiovascular Disease

## 2022-05-14 DIAGNOSIS — R0789 Other chest pain: Secondary | ICD-10-CM | POA: Diagnosis not present

## 2022-05-14 LAB — MYOCARDIAL PERFUSION IMAGING
Angina Index: 0
Duke Treadmill Score: 5
Estimated workload: 10.6
Exercise duration (min): 9 min
Exercise duration (sec): 31 s
LV dias vol: 109 mL (ref 46–106)
LV sys vol: 53 mL
MPHR: 165 {beats}/min
Nuc Stress EF: 51 %
Peak HR: 157 {beats}/min
Percent HR: 95 %
Rest HR: 57 {beats}/min
Rest Nuclear Isotope Dose: 10.7 mCi
SDS: 0
SRS: 3
SSS: 3
ST Depression (mm): 1 mm
Stress Nuclear Isotope Dose: 32.5 mCi
TID: 1.06

## 2022-05-14 MED ORDER — TECHNETIUM TC 99M TETROFOSMIN IV KIT
10.7000 | PACK | Freq: Once | INTRAVENOUS | Status: AC | PRN
  Administered 2022-05-14: 10.7 via INTRAVENOUS

## 2022-05-14 MED ORDER — TECHNETIUM TC 99M TETROFOSMIN IV KIT
32.5000 | PACK | Freq: Once | INTRAVENOUS | Status: AC | PRN
  Administered 2022-05-14: 32.5 via INTRAVENOUS

## 2022-05-18 ENCOUNTER — Other Ambulatory Visit: Payer: Self-pay

## 2022-05-19 ENCOUNTER — Telehealth: Payer: Self-pay | Admitting: *Deleted

## 2022-05-19 NOTE — Telephone Encounter (Signed)
[facility] would like to speak with physician concerning the nerve fiber analysis results,can call back and ask for Darrin if [phone] to discuss.Office closes at 7 pm.

## 2022-05-25 ENCOUNTER — Telehealth: Payer: Self-pay | Admitting: *Deleted

## 2022-05-25 NOTE — Telephone Encounter (Signed)
error 

## 2022-05-31 ENCOUNTER — Other Ambulatory Visit: Payer: Self-pay | Admitting: Physician Assistant

## 2022-06-03 ENCOUNTER — Ambulatory Visit: Payer: 59 | Admitting: Physical Medicine and Rehabilitation

## 2022-06-11 ENCOUNTER — Ambulatory Visit: Payer: 59 | Admitting: Physician Assistant

## 2022-06-15 ENCOUNTER — Ambulatory Visit: Payer: 59 | Admitting: Internal Medicine

## 2022-06-16 ENCOUNTER — Encounter: Payer: Self-pay | Admitting: Internal Medicine

## 2022-06-16 ENCOUNTER — Ambulatory Visit (INDEPENDENT_AMBULATORY_CARE_PROVIDER_SITE_OTHER): Payer: 59 | Admitting: Internal Medicine

## 2022-06-16 VITALS — BP 110/72 | HR 65 | Ht 65.0 in | Wt 163.2 lb

## 2022-06-16 DIAGNOSIS — E063 Autoimmune thyroiditis: Secondary | ICD-10-CM | POA: Diagnosis not present

## 2022-06-16 DIAGNOSIS — R5382 Chronic fatigue, unspecified: Secondary | ICD-10-CM

## 2022-06-16 DIAGNOSIS — E559 Vitamin D deficiency, unspecified: Secondary | ICD-10-CM

## 2022-06-16 DIAGNOSIS — Z23 Encounter for immunization: Secondary | ICD-10-CM | POA: Diagnosis not present

## 2022-06-16 DIAGNOSIS — E038 Other specified hypothyroidism: Secondary | ICD-10-CM

## 2022-06-16 LAB — T4, FREE: Free T4: 0.72 ng/dL (ref 0.60–1.60)

## 2022-06-16 LAB — TSH: TSH: 1.26 u[IU]/mL (ref 0.35–5.50)

## 2022-06-16 NOTE — Patient Instructions (Signed)
Continue Levothyroxine 75 mg daily.  Take the thyroid hormone every day, with water, at least 30 minutes before breakfast, separated by at least 4 hours from: - acid reflux medications - calcium - iron - multivitamins  Continue vitamin D 5000 units daily.  Stop at the lab.  Please come back for a follow-up appointment in 6 months.

## 2022-06-16 NOTE — Progress Notes (Signed)
Patient ID: [name], female   DOB: 06/15/1966, 56 y.o.   MRN: [id number]   HPI  [name] is a 56 y.o.-year-old female, initially referred by her PCP, Dr. Ronita Hipps, returning for follow-up for hypothyroidism, diagnosed as Hashimoto's hypothyroidism.  Last visit 6 months ago.  Interim history: She started on weight watchers 08/2021.  Since then, she lost approximately 17 pounds per her report.  Since our last visit, she lost 15 pounds.  Still has fatigue.  The She has a little nausea in am occasionally. She had CP >> cardiac investigations negative.  Reviewed and addended history: Patient was diagnosed with hypothyroidism in 11/2005 >> she initially started on Synthroid d.a.w. 75 mcg daily.  Her hypothyroidism was previously managed by Dr. Ronita Hipps and more recently by her PCP.  Her dose was reduced  from 112 down to 75 mcg daily, dose that she continued at our 1st visit in 05/2019.  At that time, she had several symptoms possibly related to the thyroid so we changed to Armour Thyroid. We initially started 45 mg daily in 05/2019, and we increased the dose to 60 mg daily, and then to 75 mg daily.  However, in 05/2021, per her request, we changed to Synthroid d.a.w.,  Initially 125 mcg daily, but then gradually decrease to 75 mcg daily in 11/2021.  In 12/2021, we decreased the dose to 50 mcg daily, but increase the dose back to 75 mcg daily in 02/2022.  She takes Synthroid: - in am (4 am) - fasting - 1h after: back coffee - at least 4 hours from b'fast - no Ca, Fe, PPIs - started MVI >4h after LT4 -Previously on biotin 5000 mcg daily, now off.  Reviewed patient's TFTs: Lab Results  Component Value Date   TSH 1.56 04/12/2022   TSH 9.42 (H) 02/22/2022   TSH 0.09 (L) 12/30/2021   TSH 0.04 (L) 12/01/2021   TSH 0.02 (L) 10/19/2021   TSH 0.02 (L) 09/14/2021   TSH 0.03 (L) 08/05/2021   TSH 0.89 06/08/2021   TSH 0.17 (L) 04/28/2021   TSH 0.256 (L) 03/24/2021   FREET4 0.81  04/12/2022   FREET4 0.62 02/22/2022   FREET4 1.09 12/30/2021   FREET4 0.91 12/01/2021   FREET4 1.09 10/19/2021   FREET4 1.37 09/14/2021   FREET4 1.22 08/05/2021   FREET4 0.55 (L) 06/08/2021   FREET4 0.63 04/28/2021   FREET4 0.37 (L) 12/08/2020   T3FREE 3.3 08/05/2021   T3FREE 2.2 (L) 06/08/2021   T3FREE 4.1 04/28/2021   T3FREE 2.9 12/08/2020   T3FREE 2.8 10/30/2020   T3FREE 3.0 03/31/2020   T3FREE 2.8 10/25/2019   T3FREE 3.8 09/10/2019   T3FREE 3.2 07/05/2019   T3FREE 2.9 05/24/2019  04/02/2019: TSH 1.81 (0.4-4.0) - on 75 mcg daily 02/15/2018: TSH TSH 0.23 - on 112 mcg daily >> dose decreased to 88 mcg daily  Her antithyroid antibodies were elevated pointing towards a diagnosis of Hashimoto's thyroiditis: Component     Latest Ref Rng 05/24/2019 03/31/2020 12/08/2020  Thyroperoxidase Ab SerPl-aCnc     <9 IU/mL 98 (H)  59 (H)  105 (H)   Thyroglobulin Ab     < or = 1 IU/mL <1  <1  <1   We started selenium 200 mcg daily in 09/2019.  However, antibodies are still elevated on this so I advised her that she can stop selenium.  She described several symptoms which improved after switching to Armour Thyroid: Fatigue, foggy brain, insomnia, weight gain (50 pounds in 5  years), joint pain, heat and cold intolerance, constipation, dry skin, hair loss, depression.  Pt denies: - feeling nodules in neck - dysphagia - choking  She has + FH of thyroid disorders in: M aunt and uncle. No FH of thyroid cancer. No h/o radiation tx to head or neck. No recent steroids use.  Previously on thyroid Energy- Ashwaganda, Iodine, L-thyroine -but stopped due to GERD. On gabapentin 300 mg in am and 600 mg at night prev.>> decreased to 300-600 mg at night only.  She had a mildly low vitamin D level, discovered in the setting of joint pains, which improved after starting supplementation: Lab Results  Component Value Date   VD25OH 66.3 04/01/2022   VD25OH 46.6 09/29/2021   VD25OH 40.0 04/28/2021   VD25OH  28.4 (L) 03/24/2021   VD25OH 17.9 (L) 12/08/2020   VD25OH 58.0 03/31/2020   VD25OH 26.01 (L) 10/25/2019   VD25OH 26.80 (L) 05/24/2019   We started 2000 units vitamin D daily.  We increased the dose to 4000 units daily and then to 5000 units daily.  She continues on this dose today.  Pt. also has a history of anxiety and depression -on clonazepam and Lexapro. She was started on Metformin 500 mg daily 1 mo ago for a HbA1c of 5.7%.   ROS: + See HPI + xerophthalmia  I reviewed pt's medications, allergies, PMH, social hx, family hx, and changes were documented in the history of present illness. Otherwise, unchanged from my initial visit note.  Past Medical History:  Diagnosis Date   Atrophy of thyroid (acquired)    Autoimmune thyroiditis    Chronic fatigue, unspecified    Gastro-esophageal reflux disease without esophagitis    Generalized anxiety disorder    Menopausal and female climacteric states    Migraine without aura, not intractable, without status migrainosus    Peripheral vascular disease ([place]) 1992   blood clot with broken leg, coumadin x 6 mos   Vitamin D deficiency, unspecified    Past Surgical History:  Procedure Laterality Date   Nova sure ablation  2012   rotator cuff surgery  12/2014   TONSILLECTOMY     as a child   Social History   Socioeconomic History   Marital status: Married    Spouse name: Not on file   Number of children: 15 -61 years old in 05/2019 she also takes care of her 64-year-old niece   Years of education: Not on file   Highest education level: Not on file  Occupational History    Hairstylist  Social Needs   Financial resource strain: Not on file   Food insecurity    Worry: Not on file    Inability: Not on file   Transportation needs    Medical: Not on file    Non-medical: Not on file  Tobacco Use   Smoking status: Never Smoker   Smokeless tobacco: Never Used  Substance and Sexual Activity   Alcohol use: Yes    Alcohol/week:      Types:  3 drinks per week-wine or beer   Drug use: No   Sexual activity: Yes    Birth control/protection: Surgical    Comment: husband had vasectomy   Current Outpatient Medications on File Prior to Visit  Medication Sig Dispense Refill   acetaminophen (TYLENOL) 500 MG tablet Take 500 mg by mouth every 6 (six) hours as needed.     aspirin EC 81 MG tablet Take 1 tablet (81 mg total) by mouth daily. Swallow whole.  30 tablet 12   clonazePAM (KLONOPIN) 0.5 MG tablet TAKE 1 TABLET BY MOUTH EVERYDAY AT BEDTIME 30 tablet 0   escitalopram (LEXAPRO) 20 MG tablet TAKE 1 TABLET BY MOUTH EVERY DAY 90 tablet 0   famotidine (PEPCID) 40 MG tablet Take 40 mg by mouth daily as needed for heartburn or indigestion.     fexofenadine (ALLEGRA) 180 MG tablet Take 180 mg by mouth daily.     gabapentin (NEURONTIN) 300 MG capsule Take 1 capsule AM, 2 capsules PM 270 capsule 3   Multiple Vitamin (MULTIVITAMIN) tablet Take 1 tablet by mouth daily.     polyethylene glycol (MIRALAX / GLYCOLAX) 17 g packet Take 17 g by mouth daily.     SUMAtriptan (IMITREX) 100 MG tablet TAKE 1 TABLET BY MOUTH AT ONSET OF MIGRAINE, REPEAT DOSE IN 2 HRS IF NEEDED, MAX 2 TABS IN 24 HRS (Patient not taking: Reported on 05/10/2022) 9 tablet 2   SYNTHROID 75 MCG tablet Take 1 tablet (75 mcg total) by mouth daily before breakfast. 45 tablet 3   Vitamin D, Ergocalciferol, (DRISDOL) 1.25 MG (50000 UNIT) CAPS capsule TAKE 1 CAPSULE (50,000 UNITS TOTAL) BY MOUTH EVERY 7 (SEVEN) DAYS 12 capsule 1   No current facility-administered medications on file prior to visit.   No Known Allergies   Family history:  Diabetes in mother and grandmother HTN and HL in mother and maternal aunt Heart disease in mother, maternal grandfather Breast and skin cancer in mother Also, biological father died of cancer  PE: BP 110/72 (BP Location: Left Arm, Patient Position: Sitting, Cuff Size: Normal)   Pulse 65   Ht '5\' 5"'$  (1.651 m)   Wt 163 lb 3.2 oz (74 kg)    SpO2 99%   BMI 27.16 kg/m  Wt Readings from Last 3 Encounters:  06/16/22 163 lb 3.2 oz (74 kg)  05/14/22 165 lb (74.8 kg)  05/10/22 165 lb 3.2 oz (74.9 kg)   Constitutional: overweight, in NAD Eyes:  EOMI, no exophthalmos ENT: no neck masses, no cervical lymphadenopathy Cardiovascular: RRR, No MRG Respiratory: CTA B Musculoskeletal: no deformities Skin:no rashes Neurological: no tremor with outstretched hands  ASSESSMENT: 1. Hypothyroidism -Diagnosed as Hashimoto's hypothyroidism  2.  Vitamin D insufficiency  3. Fatigue  PLAN:  1. Patient with longstanding hypothyroidism, previously on levothyroxine therapy, then on Armour thyroid, now on brand-name Synthroid per her request.  She was previously on selenium, now off.  TPO antibodies were still elevated at last check. -She was on 125 mcg of levothyroxine initially (05/2021) and we then had to decrease the dose relatively rapidly to 75 mcg daily (11/2021).  At last visit, the TSH was still suppressed and I advised her to decrease the dose to 50 mcg daily.  However, TSH returned elevated afterwards so we went back to 75 mcg daily. -Latest TSH was normal: Lab Results  Component Value Date   TSH 1.56 04/12/2022  -She continues to feel fatigued. She continues on [employer] >> lost 33 lbs since 08/2021 (15 since last OV).  - we discussed about taking the thyroid hormone every day, with water, >30 minutes before breakfast, separated by >4 hours from acid reflux medications, calcium, iron, multivitamins. Pt. is taking it correctly. - will check thyroid tests today: TSH and fT4 - If labs are abnormal, she will need to return for repeat TFTs in 1.5 months - OTW, I will see her back in 6 months.  2. vitamin D insufficiency -We checked a vitamin D level for  her in the past due to generalized joint pains.  This returned slightly low -We started her on 2000 units vitamin D daily but we increased to 5000 units daily afterwards-continues on this dose  today -Vitamin D level was normal at last check: Lab Results  Component Value Date   VD25OH 66.3 04/01/2022  -We will continue the current dose of vitamin D and recheck the level at next visit  3. Fatigue -Chronic, persistent -At last visit we discussed that this may have been related to menopause -She cannot use hormone replacement due to previous history of DVT/PE -since last visit we checked her for adrenal insufficiency -a cosyntropin stimulation test and DHEA-S levels were normal  Component     Latest Ref Rng 06/16/2022  TSH     0.35 - 5.50 uIU/mL 1.26   T4,Free(Direct)     0.60 - 1.60 ng/dL 0.72    TFTs excellent.  Philemon Kingdom, MD PhD [facility] Endocrinology

## 2022-06-17 ENCOUNTER — Ambulatory Visit: Payer: 59 | Admitting: Physician Assistant

## 2022-06-17 ENCOUNTER — Encounter: Payer: Self-pay | Admitting: Physician Assistant

## 2022-06-17 VITALS — BP 110/68 | HR 72 | Temp 97.9°F | Ht 65.0 in | Wt 162.0 lb

## 2022-06-17 DIAGNOSIS — Z23 Encounter for immunization: Secondary | ICD-10-CM | POA: Insufficient documentation

## 2022-06-17 DIAGNOSIS — E038 Other specified hypothyroidism: Secondary | ICD-10-CM

## 2022-06-17 DIAGNOSIS — R0789 Other chest pain: Secondary | ICD-10-CM | POA: Diagnosis not present

## 2022-06-17 DIAGNOSIS — E063 Autoimmune thyroiditis: Secondary | ICD-10-CM | POA: Diagnosis not present

## 2022-06-17 NOTE — Progress Notes (Signed)
Subjective:  Patient ID: [name], female    DOB: 1966/05/19  Age: 56 y.o. MRN: [id number]  Chief Complaint  Patient presents with   6 Week follow up    HPI  Pt in for follow up of atypical chest pain.  She states that she has not been having any further episodes except for perhaps mild reflux symptoms occasionally.  She did see cardiology and had a negative stress test.  Pt saw endocrinologist yesterday for follow up of Hashimotos hypothyroidism - states her labwork was good and she will be continuing synthroid 29mg qd  Pt with history of pneumonia twice in past as well as history of intermittent asthma when she was younger.  She has never had pneumo vaccine and would like to get Current Outpatient Medications on File Prior to Visit  Medication Sig Dispense Refill   acetaminophen (TYLENOL) 500 MG tablet Take 500 mg by mouth every 6 (six) hours as needed.     aspirin EC 81 MG tablet Take 1 tablet (81 mg total) by mouth daily. Swallow whole. 30 tablet 12   clonazePAM (KLONOPIN) 0.5 MG tablet TAKE 1 TABLET BY MOUTH EVERYDAY AT BEDTIME 30 tablet 0   escitalopram (LEXAPRO) 20 MG tablet TAKE 1 TABLET BY MOUTH EVERY DAY 90 tablet 0   famotidine (PEPCID) 40 MG tablet Take 40 mg by mouth daily as needed for heartburn or indigestion.     fexofenadine (ALLEGRA) 180 MG tablet Take 180 mg by mouth daily.     gabapentin (NEURONTIN) 300 MG capsule Take 1 capsule AM, 2 capsules PM 270 capsule 3   Multiple Vitamin (MULTIVITAMIN) tablet Take 1 tablet by mouth daily.     polyethylene glycol (MIRALAX / GLYCOLAX) 17 g packet Take 17 g by mouth daily.     SUMAtriptan (IMITREX) 100 MG tablet TAKE 1 TABLET BY MOUTH AT ONSET OF MIGRAINE, REPEAT DOSE IN 2 HRS IF NEEDED, MAX 2 TABS IN 24 HRS 9 tablet 2   SYNTHROID 75 MCG tablet Take 1 tablet (75 mcg total) by mouth daily before breakfast. 45 tablet 3   Vitamin D, Ergocalciferol, (DRISDOL) 1.25 MG (50000 UNIT) CAPS capsule TAKE 1 CAPSULE (50,000 UNITS  TOTAL) BY MOUTH EVERY 7 (SEVEN) DAYS 12 capsule 1   No current facility-administered medications on file prior to visit.   Past Medical History:  Diagnosis Date   Atrophy of thyroid (acquired)    Autoimmune thyroiditis    Chronic fatigue, unspecified    Gastro-esophageal reflux disease without esophagitis    Generalized anxiety disorder    Menopausal and female climacteric states    Migraine without aura, not intractable, without status migrainosus    Peripheral vascular disease ([place] 1992   blood clot with broken leg, coumadin x 6 mos   Vitamin D deficiency, unspecified    Past Surgical History:  Procedure Laterality Date   Nova sure ablation  2012   rotator cuff surgery  12/2014   TONSILLECTOMY     as a child    Family History  Problem Relation Age of Onset   Hypertension Mother    Hyperlipidemia Mother    Breast cancer Mother    Melanoma Mother    Migraines Mother    Diabetes type II Mother    Lung cancer Father    Hypertension Sister    Heart attack Maternal Grandmother    Heart attack Maternal Grandfather    Stroke Maternal Grandfather    Social History   Socioeconomic History  Marital status: Married    Spouse name: Cheryll Keisler   Number of children: 2   Years of education: 14   Highest education level: Associate degree: occupational, Hotel manager, or vocational program  Occupational History   Occupation: Hair stylist  Tobacco Use   Smoking status: Never   Smokeless tobacco: Never  Vaping Use   Vaping Use: Never used  Substance and Sexual Activity   Alcohol use: Yes    Alcohol/week: 1.0 standard drink of alcohol    Types: 1 Glasses of wine per week   Drug use: Never   Sexual activity: Yes    Partners: Male    Birth control/protection: Surgical  Other Topics Concern   Not on file  Social History Narrative   Right-handedness   One-story home   Social Determinants of Health   Financial Resource Strain: Not on file  Food Insecurity: Not on file   Transportation Needs: Not on file  Physical Activity: Not on file  Stress: Not on file  Social Connections: Not on file    Review of Systems  CONSTITUTIONAL: Negative for chills, fatigue, fever, E/N/T: Negative for ear pain, nasal congestion and sore throat.  CARDIOVASCULAR: Negative for chest pain, dizziness, palpitations and pedal edema.  RESPIRATORY: Negative for recent cough and dyspnea.   INTEGUMENTARY: Negative for rash.    Objective:  PHYSICAL EXAM:   VS: BP 110/68 (BP Location: Left Arm, Patient Position: Sitting)   Pulse 72   Temp 97.9 F (36.6 C) (Temporal)   Ht '5\' 5"'$  (1.651 m)   Wt 162 lb (73.5 kg)   SpO2 98%   BMI 26.96 kg/m   GEN: Well nourished, well developed, in no acute distress   Cardiac: RRR; no murmurs, rubs, or gallops,no edema - Respiratory:  normal respiratory rate and pattern with no distress - normal breath sounds with no rales, rhonchi, wheezes or rubs  Skin: warm and dry, no rash     Lab Results  Component Value Date   WBC 7.0 05/07/2022   HGB 13.5 05/07/2022   HCT 40.5 05/07/2022   PLT 259 05/07/2022   GLUCOSE 92 05/07/2022   CHOL 201 (H) 04/01/2022   TRIG 121 04/01/2022   HDL 51 04/01/2022   LDLCALC 128 (H) 04/01/2022   ALT 17 05/07/2022   AST 21 05/07/2022   NA 141 05/07/2022   K 4.6 05/07/2022   CL 103 05/07/2022   CREATININE 0.65 05/07/2022   BUN 13 05/07/2022   CO2 24 05/07/2022   TSH 1.26 06/16/2022   HGBA1C 5.4 04/01/2022      Assessment & Plan:   Problem List Items Addressed This Visit       Endocrine   Hypothyroidism due to Hashimoto's thyroiditis - Primary Continue current meds Follow up with endocrinologist as directed     Other   Need for vaccination for pneumococcus   Relevant Orders   Pneumococcal conjugate vaccine 20-valent   Other chest pain Follow up if symptoms recur  .  No orders of the defined types were placed in this encounter.   Orders Placed This Encounter  Procedures    Pneumococcal conjugate vaccine 20-valent     Follow-up: Return in about 5 months (around 11/16/2022) for chronic fasting follow up.  An After Visit Summary was printed and given to the patient.  Yetta Flock [facility] [phone]

## 2022-06-22 ENCOUNTER — Other Ambulatory Visit: Payer: Self-pay | Admitting: Physician Assistant

## 2022-07-15 ENCOUNTER — Encounter: Payer: 59 | Attending: Physical Medicine and Rehabilitation | Admitting: Physical Medicine and Rehabilitation

## 2022-07-15 VITALS — BP 117/77 | HR 71 | Ht 65.0 in | Wt 169.0 lb

## 2022-07-15 DIAGNOSIS — E663 Overweight: Secondary | ICD-10-CM | POA: Diagnosis present

## 2022-07-15 DIAGNOSIS — R7303 Prediabetes: Secondary | ICD-10-CM

## 2022-07-15 DIAGNOSIS — G6289 Other specified polyneuropathies: Secondary | ICD-10-CM

## 2022-07-15 NOTE — Patient Instructions (Signed)
Metanx I-methylfolate

## 2022-07-15 NOTE — Progress Notes (Signed)
Subjective:    Patient ID: [name], female    DOB: 02-11-1966, 56 y.o.   MRN: [id number]  HPI Mrs. Denslow is a 56 year year old woman presenting for follow-up of peripheral neuropathy.   1) Peripheral neuropathy -has been present for a year -she is reading my pain journal book and she loves blueberries and cherries -she has never been diagnosed with diabetes -pain is present in forefoot and top of toes -the Qutenza hurt very badly last time so does not want to repeat -toes are constantly tingling -she feels sharp pains like knives -she has tried the TENS unit- this does help ease it. -she takes an extra gabapentin at night if the pain is really bad  2) Overweight -she has tried Weight Watchers and lost 21 bs but this did not help her pain Current BMI is 28.25 and weight is 169  3) Plantar fasciitis: -she was diagnosed with plantar fasciitis and this was treated.   4) Prediabetes -discussed HgbA1c with her -her PCP was unhappy that she was started on metformin since her hemoglobin A1c  Pain Inventory Average Pain 10 Pain Right Now 5 My pain is constant, sharp, burning, stabbing, tingling, and aching  In the last 24 hours, has pain interfered with the following? General activity 7 Relation with others 7 Enjoyment of life 7 What TIME of day is your pain at its worst? night Sleep (in general) Poor  Pain is worse with: walking, sitting, inactivity, and standing Pain improves with: medication and injections Relief from Meds: 5     Family History  Problem Relation Age of Onset   Hypertension Mother    Hyperlipidemia Mother    Breast cancer Mother    Melanoma Mother    Migraines Mother    Diabetes type II Mother    Lung cancer Father    Hypertension Sister    Heart attack Maternal Grandmother    Heart attack Maternal Grandfather    Stroke Maternal Grandfather    Social History   Socioeconomic History   Marital status: Married    Spouse name: Iviona Hole   Number of children: 2   Years of education: 14   Highest education level: Associate degree: occupational, Hotel manager, or vocational program  Occupational History   Occupation: Hair stylist  Tobacco Use   Smoking status: Never   Smokeless tobacco: Never  Vaping Use   Vaping Use: Never used  Substance and Sexual Activity   Alcohol use: Yes    Alcohol/week: 1.0 standard drink of alcohol    Types: 1 Glasses of wine per week   Drug use: Never   Sexual activity: Yes    Partners: Male    Birth control/protection: Surgical  Other Topics Concern   Not on file  Social History Narrative   Right-handedness   One-story home   Social Determinants of Health   Financial Resource Strain: Not on file  Food Insecurity: Not on file  Transportation Needs: Not on file  Physical Activity: Not on file  Stress: Not on file  Social Connections: Not on file   Past Surgical History:  Procedure Laterality Date   [place]ving Copas sure ablation  2012   rotator cuff surgery  12/2014   TONSILLECTOMY     as a child   Past Medical History:  Diagnosis Date   Atrophy of thyroid (acquired)    Autoimmune thyroiditis    Chronic fatigue, unspecified    Gastro-esophageal reflux disease without esophagitis  Generalized anxiety disorder    Menopausal and female climacteric states    Migraine without aura, not intractable, without status migrainosus    Peripheral vascular disease ([place]) 1992   blood clot with broken leg, coumadin x 6 mos   Vitamin D deficiency, unspecified    BP 117/77   Pulse 71   Ht '5\' 5"'$  (1.651 m)   Wt 169 lb (76.7 kg)   SpO2 100%   BMI 28.12 kg/m   Opioid Risk Score:   Fall Risk Score:  `1  Depression screen [facility] 2/9     07/15/2022    9:23 AM 10/22/2021    9:23 AM 09/29/2021    8:40 AM 03/24/2021    8:17 AM 12/15/2020   10:31 AM 12/15/2020   10:28 AM  Depression screen PHQ 2/9  Decreased Interest 0 0 0 2 0 0  Down, Depressed, Hopeless 0 1 0 0 0 0  PHQ - 2 Score 0 1 0 2 0 0   Altered sleeping  0  3    Tired, decreased energy  3  3    Change in appetite  0  1    Feeling bad or failure about yourself   0  3    Trouble concentrating  2  1    Moving slowly or fidgety/restless  0  0    Suicidal thoughts  0  0    PHQ-9 Score  6  13    Difficult doing work/chores  Somewhat difficult  Not difficult at all       Review of Systems  Gastrointestinal:  Positive for constipation.  Musculoskeletal:  Positive for gait problem.       Spasms  Neurological:  Positive for weakness and numbness.  Psychiatric/Behavioral:  Positive for confusion and dysphoric mood. The patient is nervous/anxious.   All other systems reviewed and are negative.      Objective:   Physical Exam Gen: no distress, normal appearing HEENT: oral mucosa pink and moist, NCAT Cardio: Reg rate Chest: normal effort, normal rate of breathing Abd: soft, non-distended Ext: no edema Psych: pleasant, normal affect Skin: intact Neuro: Alert and oriented x3    Assessment & Plan:  1) Peripheral neuropathy -will defer Qutenza -hgbA1c ordered.  -f/u with neurology  2) Overweight: -Discussed the benefits of intermittent fasting. Recommended starting with pushing dinner 15 minutes earlier and when this feels easy, continuing to push dinner 15 minutes earlier. Discussed that this can help her body to improve its ability to burn fat rather than glucose, improving insulin sensitivity. Recommended drinking Roobois tea in the evening to help curb appetite and for its numerous health benefits.   Current weight is 169 lbs and BMI is 28.12  3) Prediabetes: -discussed hgbA1c is 5.4 after starting metformin -repeat HgbA1c next visit -Discussed the benefits of intermittent fasting. Recommended starting with pushing dinner 15 minutes earlier and when this feels easy, continuing to push dinner 15 minutes earlier. Discussed that this can help her body to improve its ability to burn fat rather than glucose, improving  insulin sensitivity. Recommended drinking Roobois tea in the evening to help curb appetite and for its numerous health benefits.

## 2022-07-16 LAB — BASIC METABOLIC PANEL
BUN/Creatinine Ratio: 27 — ABNORMAL HIGH (ref 9–23)
BUN: 16 mg/dL (ref 6–24)
CO2: 23 mmol/L (ref 20–29)
Calcium: 9.5 mg/dL (ref 8.7–10.2)
Chloride: 103 mmol/L (ref 96–106)
Creatinine, Ser: 0.6 mg/dL (ref 0.57–1.00)
Glucose: 89 mg/dL (ref 70–99)
Potassium: 4.3 mmol/L (ref 3.5–5.2)
Sodium: 141 mmol/L (ref 134–144)
eGFR: 105 mL/min/{1.73_m2} (ref >59)

## 2022-07-16 LAB — HEMOGLOBIN A1C
Est. average glucose Bld gHb Est-mCnc: 114 mg/dL
Hgb A1c MFr Bld: 5.6 % (ref 4.8–5.6)

## 2022-07-16 MED ORDER — METFORMIN HCL ER 500 MG PO TB24: 500.0000 mg | ORAL_TABLET | Freq: Every day | ORAL | 3 refills | Status: DC

## 2022-07-16 NOTE — Addendum Note (Signed)
Addended by: Izora Ribas on: 07/16/2022 07:17 PM   Modules accepted: Orders

## 2022-08-20 LAB — HM MAMMOGRAPHY: HM Mammogram: NORMAL (ref 0–4)

## 2022-08-23 ENCOUNTER — Other Ambulatory Visit: Payer: Self-pay | Admitting: Physician Assistant

## 2022-08-23 ENCOUNTER — Encounter: Payer: Self-pay | Admitting: Physician Assistant

## 2022-08-25 LAB — HM DEXA SCAN

## 2022-09-06 ENCOUNTER — Ambulatory Visit: Payer: 59 | Admitting: Physical Medicine and Rehabilitation

## 2022-09-29 ENCOUNTER — Telehealth: Payer: Self-pay

## 2022-09-29 NOTE — Telephone Encounter (Signed)
Patient called this morning requesting an appointment for sinus infection symptoms started about three weeks ago. Patient notified that we do not have an opening for today but we have an opening for tomorrow morning. Patient stating that she was unable to wait. It was recommended for her to go to UC or to do an e-visit with a [facility] provider.

## 2022-10-05 ENCOUNTER — Ambulatory Visit: Payer: 59 | Admitting: Physician Assistant

## 2022-10-05 ENCOUNTER — Encounter: Payer: Self-pay | Admitting: Physician Assistant

## 2022-10-05 VITALS — BP 108/70 | HR 68 | Temp 97.2°F | Resp 18 | Ht 66.0 in | Wt 165.0 lb

## 2022-10-05 DIAGNOSIS — F419 Anxiety disorder, unspecified: Secondary | ICD-10-CM

## 2022-10-05 DIAGNOSIS — E038 Other specified hypothyroidism: Secondary | ICD-10-CM

## 2022-10-05 DIAGNOSIS — E782 Mixed hyperlipidemia: Secondary | ICD-10-CM

## 2022-10-05 DIAGNOSIS — M792 Neuralgia and neuritis, unspecified: Secondary | ICD-10-CM

## 2022-10-05 DIAGNOSIS — E559 Vitamin D deficiency, unspecified: Secondary | ICD-10-CM | POA: Diagnosis not present

## 2022-10-05 DIAGNOSIS — R7303 Prediabetes: Secondary | ICD-10-CM

## 2022-10-05 DIAGNOSIS — E063 Autoimmune thyroiditis: Secondary | ICD-10-CM

## 2022-10-05 NOTE — Progress Notes (Signed)
Subjective:  Patient ID: [name], female    DOB: [birth year]  Age: 57 y.o. MRN: [id number]  Chief Complaint  Patient presents with   Hyperlipidemia   Anxiety    Hyperlipidemia  Anxiety      Pt with history of hypothyroidism - currently on synthroid 75 mcg and follows with endocrinology Dr De Blanch - voices no problems or concerns  Pt with history of anxiety -  currently stable on medication klonopin and lexapro 87m - she has not had any breakthrough symptoms recently  Pt with history of vit D deficiency - is on weekly supplement and due for labwork  Pt with history of hyperlipidemia - does watch diet - pt states she stopped taking crestor 533mbecause she has been watching diet and losing weight - has not restarted medication Is due for labwork  Pt is being followed by specialist for chronic foot pain/neuralgia - she is taking gabapentin for pain which is being managed at this time however the pain clinic actually checked a hgb a1c on pt and she states the reading was 5.7 and she was started on glucophage ---- I discussed with pt and told her she is not a candidate for glucophage based on that reading and patient states her endocrinologist advised the same thing  She states she did stop the medication after our last visit however she saw the pain provider again who checked a Hgb A1c at 5.6 and told to restart medication - she has been taking again since 07/16/22 --- will recheck labwork today but advised again she is at risk of hypoglycemia and recommend to discuss with endocrinology for her follow up appt 12/15/22 Current Outpatient Medications on File Prior to Visit  Medication Sig Dispense Refill   acetaminophen (TYLENOL) 500 MG tablet Take 500 mg by mouth every 6 (six) hours as needed.     clonazePAM (KLONOPIN) 0.5 MG tablet TAKE 1 TABLET BY MOUTH EVERYDAY AT BEDTIME 30 tablet 0   escitalopram (LEXAPRO) 20 MG tablet TAKE 1 TABLET BY MOUTH EVERY DAY 90 tablet 0   famotidine  (PEPCID) 40 MG tablet Take 40 mg by mouth daily as needed for heartburn or indigestion.     fexofenadine (ALLEGRA) 180 MG tablet Take 180 mg by mouth daily.     gabapentin (NEURONTIN) 300 MG capsule Take 1 capsule AM, 2 capsules PM 270 capsule 3   metFORMIN (GLUCOPHAGE-XR) 500 MG 24 hr tablet Take 1 tablet (500 mg total) by mouth daily with breakfast. 90 tablet 3   Multiple Vitamin (MULTIVITAMIN) tablet Take 1 tablet by mouth daily.     polyethylene glycol (MIRALAX / GLYCOLAX) 17 g packet Take 17 g by mouth daily.     SUMAtriptan (IMITREX) 100 MG tablet TAKE 1 TABLET BY MOUTH AT ONSET OF MIGRAINE, REPEAT DOSE IN 2 HRS IF NEEDED, MAX 2 TABS IN 24 HRS 9 tablet 2   SYNTHROID 75 MCG tablet Take 1 tablet (75 mcg total) by mouth daily before breakfast. 45 tablet 3   Vitamin D, Ergocalciferol, (DRISDOL) 1.25 MG (50000 UNIT) CAPS capsule TAKE 1 CAPSULE (50,000 UNITS TOTAL) BY MOUTH EVERY 7 (SEVEN) DAYS 12 capsule 1   No current facility-administered medications on file prior to visit.   Past Medical History:  Diagnosis Date   Atrophy of thyroid (acquired)    Autoimmune thyroiditis    Chronic fatigue, unspecified    Gastro-esophageal reflux disease without esophagitis    Generalized anxiety disorder    Menopausal and female climacteric  states    Migraine without aura, not intractable, without status migrainosus    Peripheral vascular disease ([place]) 1992   blood clot with broken leg, coumadin x 6 mos   Vitamin D deficiency, unspecified    Past Surgical History:  Procedure Laterality Date   Nova sure ablation  2012   rotator cuff surgery  12/2014   TONSILLECTOMY     as a child    Family History  Problem Relation Age of Onset   Hypertension Mother    Hyperlipidemia Mother    Breast cancer Mother    Melanoma Mother    Migraines Mother    Diabetes type II Mother    Lung cancer Father    Hypertension Sister    Heart attack Maternal Grandmother    Heart attack Maternal Grandfather     Stroke Maternal Grandfather    Social History   Socioeconomic History   Marital status: Married    Spouse name: Taleeya Hamlyn   Number of children: 2   Years of education: 14   Highest education level: Associate degree: occupational, Hotel manager, or vocational program  Occupational History   Occupation: Hair stylist  Tobacco Use   Smoking status: Never   Smokeless tobacco: Never  Vaping Use   Vaping Use: Never used  Substance and Sexual Activity   Alcohol use: Yes    Alcohol/week: 1.0 standard drink of alcohol    Types: 1 Glasses of wine per week   Drug use: Never   Sexual activity: Yes    Partners: Male    Birth control/protection: Surgical  Other Topics Concern   Not on file  Social History Narrative   Right-handedness   One-story home   Social Determinants of Health   Financial Resource Strain: Not on file  Food Insecurity: Not on file  Transportation Needs: Not on file  Physical Activity: Not on file  Stress: Not on file  Social Connections: Not on file    CONSTITUTIONAL: Negative for chills, fatigue, fever, unintentional weight gain and unintentional weight loss.  E/N/T: Negative for ear pain, nasal congestion and sore throat.  CARDIOVASCULAR: Negative for chest pain, dizziness, palpitations and pedal edema.  RESPIRATORY: Negative for recent cough and dyspnea.  GASTROINTESTINAL: Negative for abdominal pain, acid reflux symptoms, constipation, diarrhea, nausea and vomiting.  MSK: Negative for arthralgias and myalgias.  INTEGUMENTARY: Negative for rash.  NEUROLOGICAL: Negative for dizziness and headaches.  PSYCHIATRIC: Negative for sleep disturbance and to question depression screen.  Negative for depression, negative for anhedonia.        Objective:  PHYSICAL EXAM:   VS: BP 108/70   Pulse 68   Temp (!) 97.2 F (36.2 C)   Resp 18   Ht 5' 6"$  (1.676 m)   Wt 165 lb (74.8 kg)   SpO2 98%   BMI 26.63 kg/m   GEN: Well nourished, well developed, in no acute  distress  Cardiac: RRR; no murmurs, rubs, or gallops,no edema -  Respiratory:  normal respiratory rate and pattern with no distress - normal breath sounds with no rales, rhonchi, wheezes or rubs MS: no deformity or atrophy  Skin: warm and dry, no rash  Psych: euthymic mood, appropriate affect and demeanor    Lab Results  Component Value Date   WBC 7.0 05/07/2022   HGB 13.5 05/07/2022   HCT 40.5 05/07/2022   PLT 259 05/07/2022   GLUCOSE 89 07/15/2022   CHOL 201 (H) 04/01/2022   TRIG 121 04/01/2022   HDL 51 04/01/2022  [place] 128 (H) 04/01/2022   ALT 17 05/07/2022   AST 21 05/07/2022   NA 141 07/15/2022   K 4.3 07/15/2022   CL 103 07/15/2022   CREATININE 0.60 07/15/2022   BUN 16 07/15/2022   CO2 23 07/15/2022   TSH 1.26 06/16/2022   HGBA1C 5.6 07/15/2022      Assessment & Plan:   Problem List Items Addressed This Visit       Endocrine   Acquired hypothyroidism Continue follow up with endocrinologist Continue syntrhoid 19mg qd     Other   Vitamin D insufficiency   Relevant Orders   VITAMIN D 25 Hydroxy (Vit-D Deficiency, Fractures) Continue weekly supplement   Other Visit Diagnoses     Neuralgia    -  Primary   Relevant Orders   CBC with Differential/Platelet   Comprehensive metabolic panel Continue gabapentin Follow up with specialist as directed   Anxiety     Continue current meds   Mixed hyperlipidemia       Relevant Orders   CBC with Differential/Platelet   Comprehensive metabolic panel   Lipid panel Watch diet  Prediabetes Continue to watch diet Stop glucophage Follow up with endocrinologist as directed      .  No orders of the defined types were placed in this encounter.   No orders of the defined types were placed in this encounter.    Follow-up: Return in about 6 months (around 04/05/2023) for chronic fasting follow up.  An After Visit Summary was printed and given to the patient.  [facility] ([phone]

## 2022-10-06 ENCOUNTER — Other Ambulatory Visit: Payer: Self-pay | Admitting: Physician Assistant

## 2022-10-06 DIAGNOSIS — E782 Mixed hyperlipidemia: Secondary | ICD-10-CM

## 2022-10-06 LAB — CBC WITH DIFFERENTIAL/PLATELET
Basophils Absolute: 0 10*3/uL (ref 0.0–0.2)
Basos: 1 %
EOS (ABSOLUTE): 0.2 10*3/uL (ref 0.0–0.4)
Eos: 2 %
Hematocrit: 39.1 % (ref 34.0–46.6)
Hemoglobin: 13.2 g/dL (ref 11.1–15.9)
Immature Grans (Abs): 0 10*3/uL (ref 0.0–0.1)
Immature Granulocytes: 0 %
Lymphocytes Absolute: 2.3 10*3/uL (ref 0.7–3.1)
Lymphs: 32 %
MCH: 30.6 pg (ref 26.6–33.0)
MCHC: 33.8 g/dL (ref 31.5–35.7)
MCV: 91 fL (ref 79–97)
Monocytes Absolute: 0.6 10*3/uL (ref 0.1–0.9)
Monocytes: 8 %
Neutrophils Absolute: 4 10*3/uL (ref 1.4–7.0)
Neutrophils: 57 %
Platelets: 278 10*3/uL (ref 150–450)
RBC: 4.31 x10E6/uL (ref 3.77–5.28)
RDW: 12.5 % (ref 11.7–15.4)
WBC: 7.1 10*3/uL (ref 3.4–10.8)

## 2022-10-06 LAB — COMPREHENSIVE METABOLIC PANEL
ALT: 14 IU/L (ref 0–32)
AST: 14 IU/L (ref 0–40)
Albumin/Globulin Ratio: 2.1 (ref 1.2–2.2)
Albumin: 4.7 g/dL (ref 3.8–4.9)
Alkaline Phosphatase: 70 IU/L (ref 44–121)
BUN/Creatinine Ratio: 18 (ref 9–23)
BUN: 12 mg/dL (ref 6–24)
Bilirubin Total: 0.4 mg/dL (ref 0.0–1.2)
CO2: 23 mmol/L (ref 20–29)
Calcium: 9.3 mg/dL (ref 8.7–10.2)
Chloride: 103 mmol/L (ref 96–106)
Creatinine, Ser: 0.66 mg/dL (ref 0.57–1.00)
Globulin, Total: 2.2 g/dL (ref 1.5–4.5)
Glucose: 86 mg/dL (ref 70–99)
Potassium: 4.6 mmol/L (ref 3.5–5.2)
Sodium: 141 mmol/L (ref 134–144)
Total Protein: 6.9 g/dL (ref 6.0–8.5)
eGFR: 103 mL/min/{1.73_m2} (ref >59)

## 2022-10-06 LAB — LIPID PANEL
Chol/HDL Ratio: 3.4 ratio (ref 0.0–4.4)
Cholesterol, Total: 222 mg/dL — ABNORMAL HIGH (ref 100–199)
HDL: 66 mg/dL (ref >39)
LDL Chol Calc (NIH): 145 mg/dL — ABNORMAL HIGH (ref 0–99)
Triglycerides: 64 mg/dL (ref 0–149)
VLDL Cholesterol Cal: 11 mg/dL (ref 5–40)

## 2022-10-06 LAB — HEMOGLOBIN A1C
Est. average glucose Bld gHb Est-mCnc: 117 mg/dL
Hgb A1c MFr Bld: 5.7 % — ABNORMAL HIGH (ref 4.8–5.6)

## 2022-10-06 LAB — VITAMIN D 25 HYDROXY (VIT D DEFICIENCY, FRACTURES): Vit D, 25-Hydroxy: 67.8 ng/mL (ref 30.0–100.0)

## 2022-10-06 LAB — TSH: TSH: 0.776 u[IU]/mL (ref 0.450–4.500)

## 2022-10-06 LAB — CARDIOVASCULAR RISK ASSESSMENT

## 2022-10-06 MED ORDER — ROSUVASTATIN CALCIUM 5 MG PO TABS: 5.0000 mg | ORAL_TABLET | Freq: Every day | ORAL | 0 refills | Status: DC

## 2022-10-14 ENCOUNTER — Encounter: Payer: 59 | Admitting: Physical Medicine and Rehabilitation

## 2022-10-19 ENCOUNTER — Other Ambulatory Visit: Payer: Self-pay | Admitting: Internal Medicine

## 2022-10-23 ENCOUNTER — Other Ambulatory Visit: Payer: Self-pay | Admitting: Physician Assistant

## 2022-10-26 ENCOUNTER — Other Ambulatory Visit: Payer: Self-pay | Admitting: Physician Assistant

## 2022-10-26 DIAGNOSIS — E559 Vitamin D deficiency, unspecified: Secondary | ICD-10-CM

## 2022-11-01 ENCOUNTER — Encounter: Payer: 59 | Attending: Physical Medicine and Rehabilitation | Admitting: Physical Medicine and Rehabilitation

## 2022-11-01 ENCOUNTER — Encounter: Payer: Self-pay | Admitting: Physical Medicine and Rehabilitation

## 2022-11-01 VITALS — BP 117/76 | HR 65 | Ht 66.0 in | Wt 165.0 lb

## 2022-11-01 DIAGNOSIS — G6289 Other specified polyneuropathies: Secondary | ICD-10-CM | POA: Diagnosis present

## 2022-11-01 DIAGNOSIS — E663 Overweight: Secondary | ICD-10-CM | POA: Insufficient documentation

## 2022-11-01 MED ORDER — METFORMIN HCL ER 750 MG PO TB24: 750.0000 mg | ORAL_TABLET | Freq: Every day | ORAL | 3 refills | Status: DC

## 2022-11-01 MED ORDER — TOPIRAMATE 25 MG PO TABS: 25.0000 mg | ORAL_TABLET | Freq: Two times a day (BID) | ORAL | 3 refills | Status: DC

## 2022-11-01 NOTE — Progress Notes (Signed)
Subjective:    Patient ID: [name], female    DOB: [birth year], 57 y.o.   MRN: [id number]  HPI [name] is a 57 year year old woman presenting for follow-up of peripheral neuropathy.   1) Peripheral neuropathy -started back hurting in her toes and ankles -has an appointment with Dr. Tillman Abide neurology as it is moving into her fingers -has been present for a year -she is reading my pain journal book and she loves blueberries and cherries -she has never been diagnosed with diabetes -pain is present in forefoot and top of toes -the Qutenza hurt very badly last time so does not want to repeat -toes are constantly tingling -she feels sharp pains like knives -she has tried the TENS unit- this does help ease it. -she takes an extra gabapentin at night if the pain is really bad  2) Overweight -she has tried Weight Watchers and lost 21 bs but this did not help her pain Current BMI is 28.25 and weight is 169  3) Plantar fasciitis: -she was diagnosed with plantar fasciitis and this was treated.   4) Prediabetes -discussed HgbA1c with her -her PCP was unhappy that she was started on metformin since her hemoglobin A1c  Pain Inventory Average Pain 8 Pain Right Now 6 My pain is intermittent, sharp, burning, dull, stabbing, tingling, and aching  In the last 24 hours, has pain interfered with the following? General activity 9 Relation with others 6 Enjoyment of life 6 What TIME of day is your pain at its worst? morning , evening, and night Sleep (in general) Poor  Pain is worse with: walking, sitting, inactivity, and standing Pain improves with: rest and medication Relief from Meds: 5     Family History  Problem Relation Age of Onset   Hypertension Mother    Hyperlipidemia Mother    Breast cancer Mother    Melanoma Mother    Migraines Mother    Diabetes type II Mother    Lung cancer Father    Hypertension Sister    Heart attack Maternal Grandmother    Heart  attack Maternal Grandfather    Stroke Maternal Grandfather    Social History   Socioeconomic History   Marital status: Married    Spouse name: Ednita Ousley   Number of children: 2   Years of education: 14   Highest education level: Associate degree: occupational, Hotel manager, or vocational program  Occupational History   Occupation: Hair stylist  Tobacco Use   Smoking status: Never   Smokeless tobacco: Never  Vaping Use   Vaping Use: Never used  Substance and Sexual Activity   Alcohol use: Yes    Alcohol/week: 1.0 standard drink of alcohol    Types: 1 Glasses of wine per week   Drug use: Never   Sexual activity: Yes    Partners: Male    Birth control/protection: Surgical  Other Topics Concern   Not on file  Social History Narrative   Right-handedness   One-story home   Social Determinants of Health   Financial Resource Strain: Not on file  Food Insecurity: Not on file  Transportation Needs: Not on file  Physical Activity: Not on file  Stress: Not on file  Social Connections: Not on file   Past Surgical History:  Procedure Laterality Date   [place] sure ablation  2012   rotator cuff surgery  12/2014   TONSILLECTOMY     as a child   Past Medical History:  Diagnosis Date  Atrophy of thyroid (acquired)    Autoimmune thyroiditis    Chronic fatigue, unspecified    Gastro-esophageal reflux disease without esophagitis    Generalized anxiety disorder    Menopausal and female climacteric states    Migraine without aura, not intractable, without status migrainosus    Peripheral vascular disease ([place]) 1992   blood clot with broken leg, coumadin x 6 mos   Vitamin D deficiency, unspecified    Ht 5\' 6"  (1.676 m)   Wt 165 lb (74.8 kg)   BMI 26.63 kg/m   Opioid Risk Score:   Fall Risk Score:  `1  Depression screen [facility] 2/9     07/15/2022    9:23 AM 10/22/2021    9:23 AM 09/29/2021    8:40 AM 03/24/2021    8:17 AM 12/15/2020   10:31 AM 12/15/2020   10:28 AM  Depression  screen PHQ 2/9  Decreased Interest 0 0 0 2 0 0  Down, Depressed, Hopeless 0 1 0 0 0 0  PHQ - 2 Score 0 1 0 2 0 0  Altered sleeping  0  3    Tired, decreased energy  3  3    Change in appetite  0  1    Feeling bad or failure about yourself   0  3    Trouble concentrating  2  1    Moving slowly or fidgety/restless  0  0    Suicidal thoughts  0  0    PHQ-9 Score  6  13    Difficult doing work/chores  Somewhat difficult  Not difficult at all       Review of Systems  Gastrointestinal:  Positive for constipation.  Musculoskeletal:  Positive for gait problem.       Spasms  Neurological:  Positive for weakness and numbness.  Psychiatric/Behavioral:  Positive for confusion and dysphoric mood. The patient is nervous/anxious.   All other systems reviewed and are negative.      Objective:   Physical Exam Gen: no distress, normal appearing HEENT: oral mucosa pink and moist, NCAT Cardio: Reg rate Chest: normal effort, normal rate of breathing Abd: soft, non-distended Ext: no edema Psych: pleasant, normal affect Skin: intact Neuro: Alert and oriented x3    Assessment & Plan:  1) Peripheral neuropathy -will defer Qutenza -hgbA1c ordered.  -f/u with neurology -prescribed topamax  2) Overweight: -Discussed the benefits of intermittent fasting. Recommended starting with pushing dinner 15 minutes earlier and when this feels easy, continuing to push dinner 15 minutes earlier. Discussed that this can help her body to improve its ability to burn fat rather than glucose, improving insulin sensitivity. Recommended drinking Roobois tea in the evening to help curb appetite and for its numerous health benefits.   Commended on 4 lb weight loss! Discussed current weight is 165 lbs.  -prescribed topamax  3) Prediabetes: -increase metformin 500mg  BID -repeat HgbA1c reviewed and shows elevation to 5.7.  -discussed discussing with endocrinologists -Discussed the benefits of intermittent fasting.  Recommended starting with pushing dinner 15 minutes earlier and when this feels easy, continuing to push dinner 15 minutes earlier. Discussed that this can help her body to improve its ability to burn fat rather than glucose, improving insulin sensitivity. Recommended drinking Roobois tea in the evening to help curb appetite and for its numerous health benefits.   -discussed benefits of continuous blood glucose monitoring.

## 2022-11-06 IMAGING — MR MR THORACIC SPINE W/O CM
4 of 6 series · 18 of 48 positions shown · non-contrast
Comparison: None available.

CLINICAL DATA: Initial evaluation for canal stenosis. Posterior
sharp pain in bilateral feet and legs for 9 months. Numbness and
weakness.

EXAM:
MRI THORACIC SPINE WITHOUT CONTRAST
TECHNIQUE: Multiplanar, multisequence MR imaging of the thoracic spine was
performed. No intravenous contrast was administered.

[Series 2: T1 · sagittal · 3.0mm · 1.25mm/px · 3 of 16 slices shown]
[im 4/16]
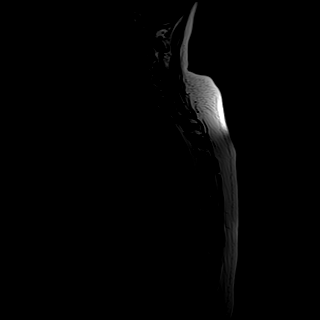
[im 10/16]
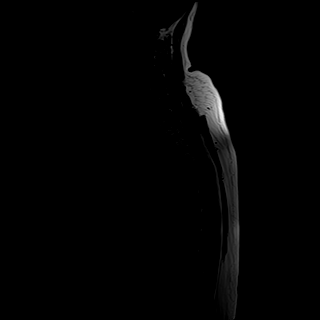
[im 16/16]
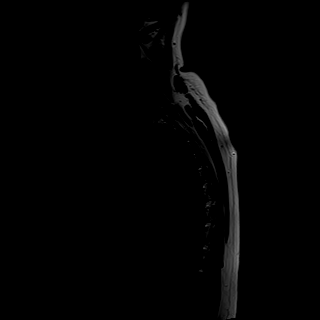

[Series 4: T2 · sagittal · 4.0mm · 0.47mm/px · 6 of 19 slices shown (1 of 3)]
[im 1/19]
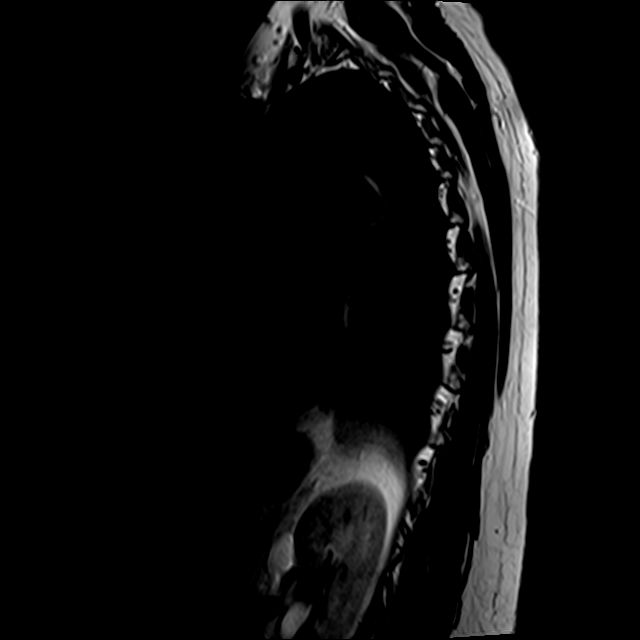
[im 4/19]
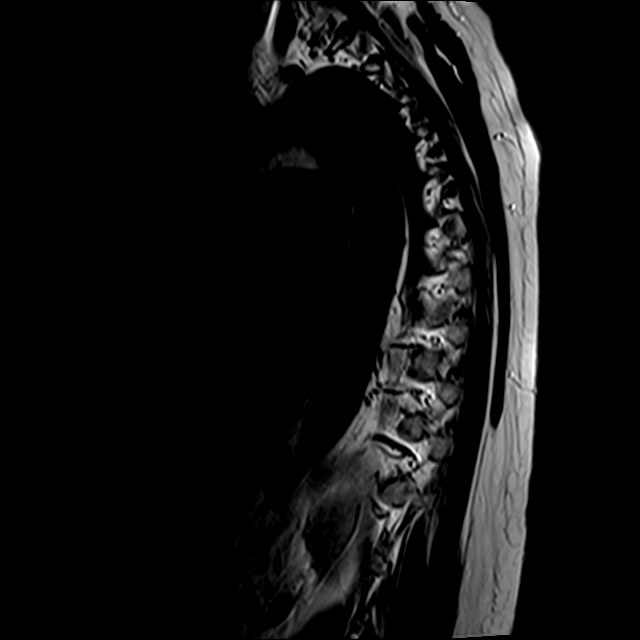
[im 8/19]
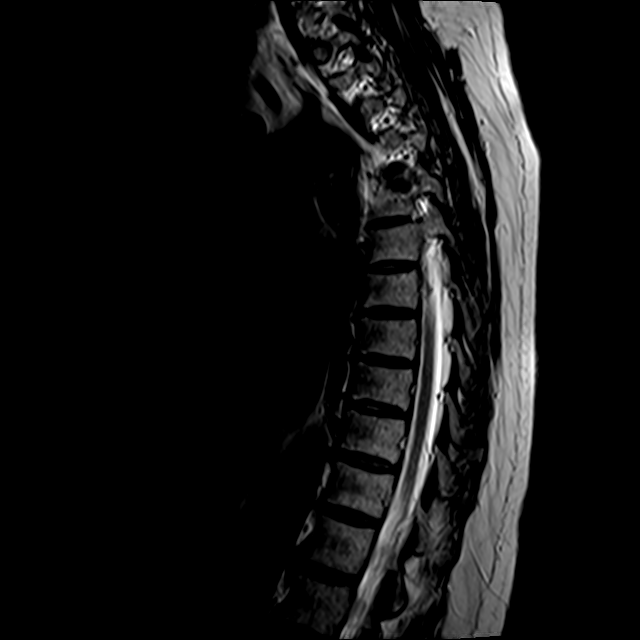
[im 11/19]
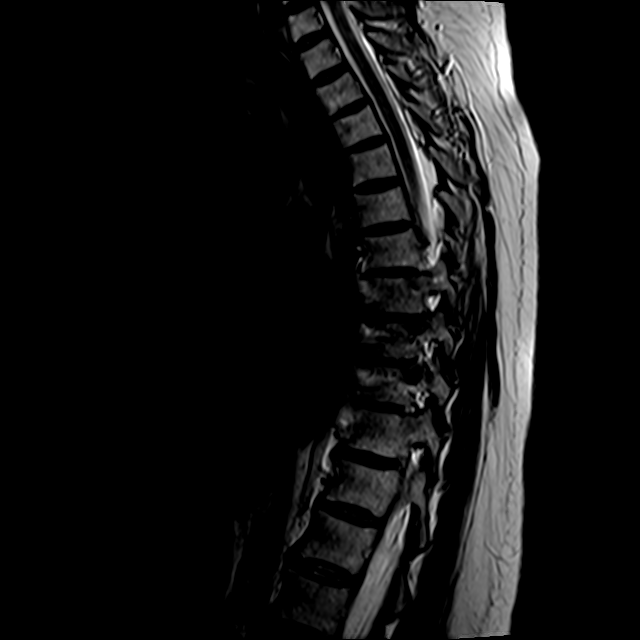
[im 15/19]
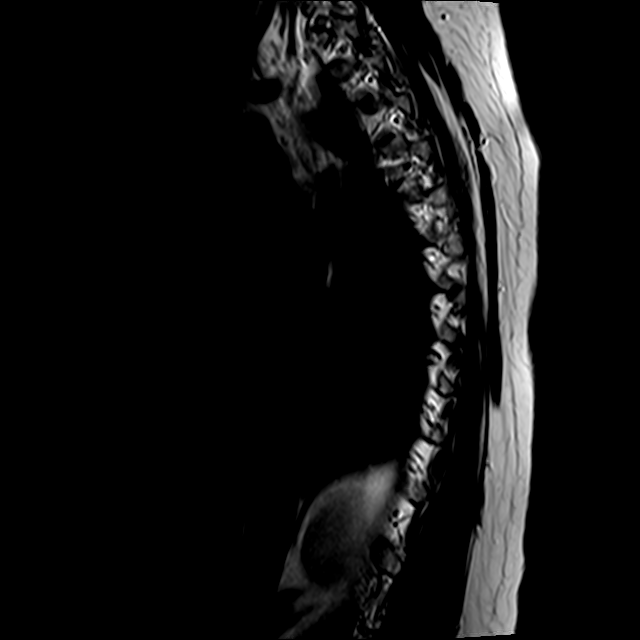
[im 19/19]
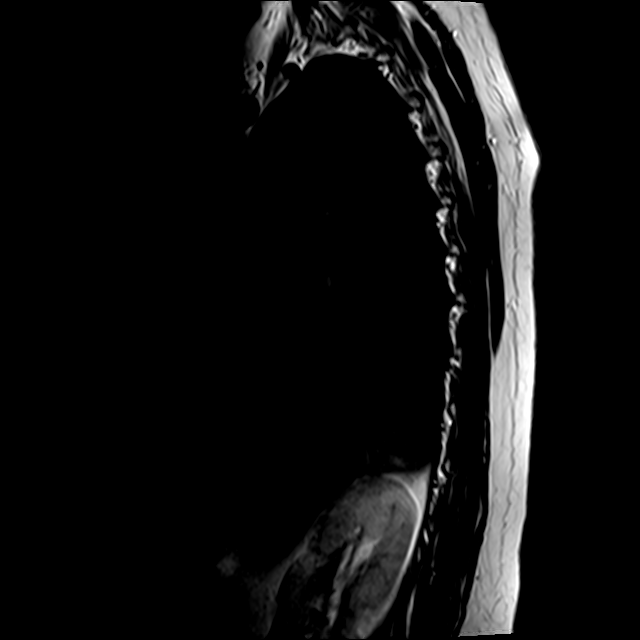

[Series 7: T2 · axial · 4.0mm · 0.39mm/px · z∈[-261,-82]mm · 6 of 39 slices shown (2 of 3)]
[im 1/39]
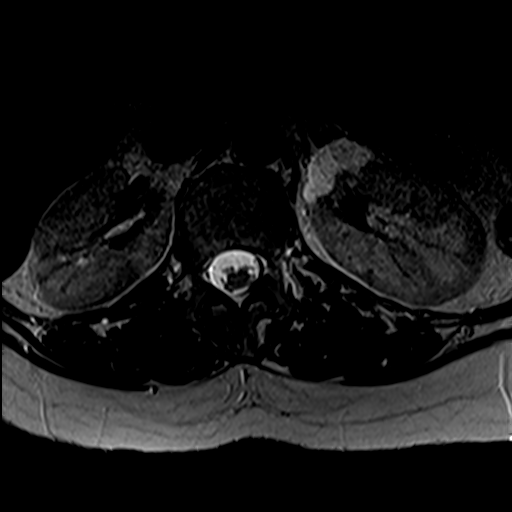
[im 7/39]
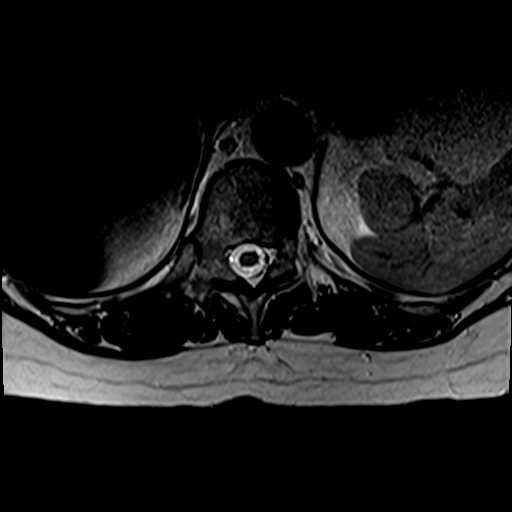
[im 11/39]
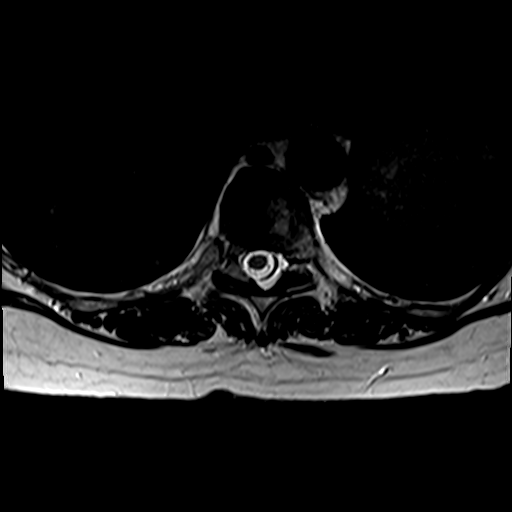
[im 18/39]
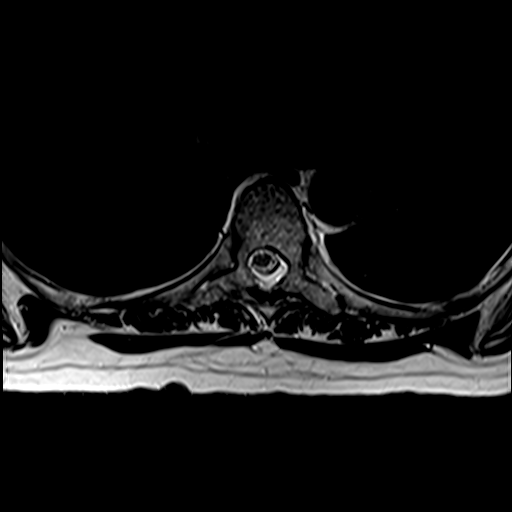
[im 21/39]
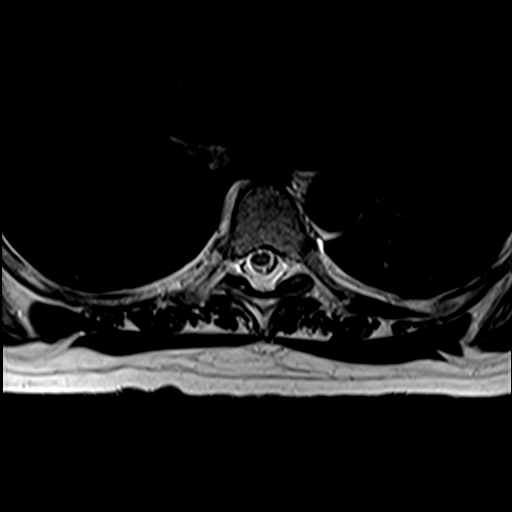
[im 35/39]
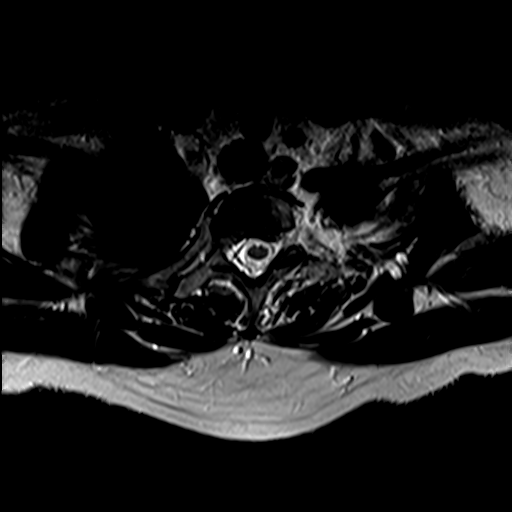

[Series 8: T2 · axial · 4.0mm · 0.39mm/px · z∈[-214,-82]mm · 3 of 39 slices shown (3 of 3)]
[im 7/39]
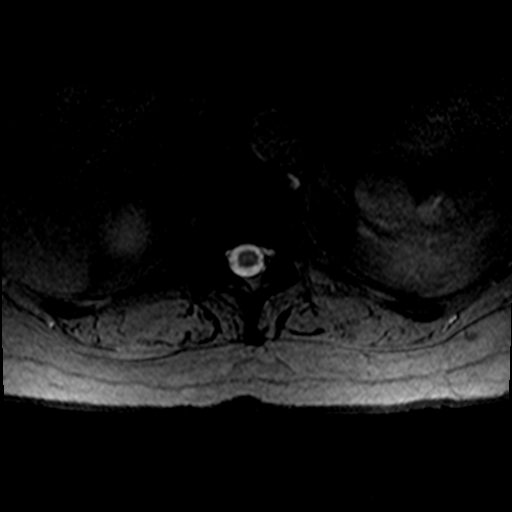
[im 21/39]
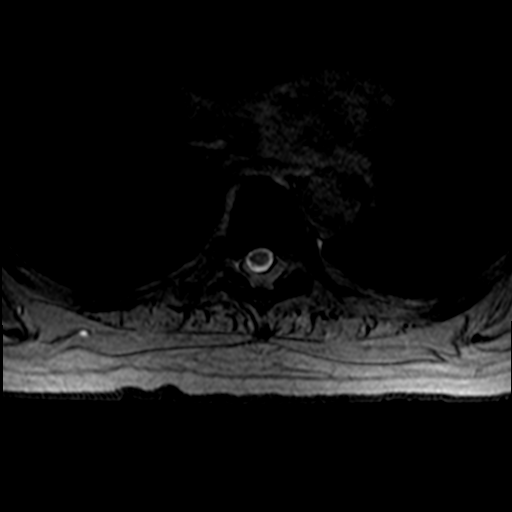
[im 35/39]
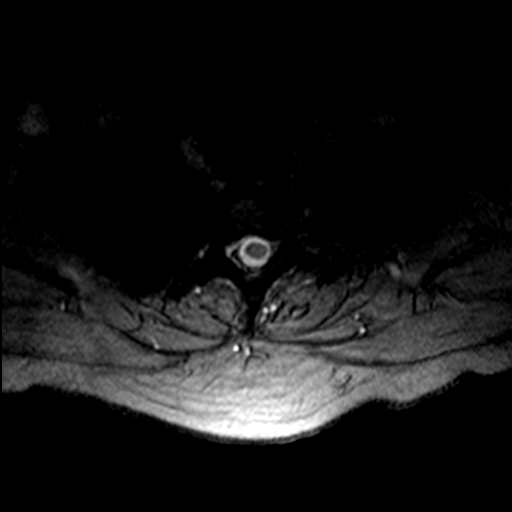

[18 of 48 positions shown; findings below may reference images not displayed]

FINDINGS: Alignment: Sigmoid scoliotic curvature of the thoracic spine.
Alignment otherwise normal with preservation of the normal thoracic
kyphosis. No listhesis.

Vertebrae: Vertebral body height maintained without acute or chronic
fracture. Bone marrow signal intensity within normal limits. No
discrete or worrisome osseous lesions. No abnormal marrow edema.

Cord:  Normal signal and morphology.

Paraspinal and other soft tissues: Paraspinous soft tissues within
normal limits. Partially visualized lungs are clear. Lobulated
cm T2 hyperintense lesion noted along the anteromedial margin of the
partially visualized left kidney, indeterminate (series 7, image
39).

Disc levels:

No significant disc pathology seen within the thoracic spine. No
disc bulge or focal disc herniation. No significant facet pathology.
Mild diffuse prominence of the dorsal epidural fat. No significant
spinal stenosis. Neural foramina remain patent. No neural
impingement.
IMPRESSION: 1. Sigmoid scoliotic curvature of the thoracic spine.
2. Otherwise unremarkable and normal MRI of the thoracic spine. No
significant disc pathology or stenosis. No neural impingement.
3. 3.2 cm T2 hyperintense lesion positioned along the anteromedial
margin of the partially visualized left kidney, indeterminate.
Correlation with dedicated cross-sectional imaging of the abdomen
and pelvis suggested for further characterization.

## 2022-11-08 ENCOUNTER — Ambulatory Visit: Payer: 59 | Admitting: Physician Assistant

## 2022-11-24 ENCOUNTER — Other Ambulatory Visit: Payer: Self-pay | Admitting: Physician Assistant

## 2022-12-05 ENCOUNTER — Other Ambulatory Visit: Payer: Self-pay | Admitting: Internal Medicine

## 2022-12-05 DIAGNOSIS — E039 Hypothyroidism, unspecified: Secondary | ICD-10-CM

## 2022-12-13 ENCOUNTER — Encounter: Payer: 59 | Admitting: Physical Medicine and Rehabilitation

## 2022-12-15 ENCOUNTER — Encounter: Payer: Self-pay | Admitting: Internal Medicine

## 2022-12-15 ENCOUNTER — Ambulatory Visit: Payer: 59 | Admitting: Internal Medicine

## 2022-12-15 VITALS — BP 110/74 | HR 51 | Ht 66.0 in | Wt 167.6 lb

## 2022-12-15 DIAGNOSIS — E559 Vitamin D deficiency, unspecified: Secondary | ICD-10-CM

## 2022-12-15 DIAGNOSIS — R5382 Chronic fatigue, unspecified: Secondary | ICD-10-CM

## 2022-12-15 DIAGNOSIS — E038 Other specified hypothyroidism: Secondary | ICD-10-CM | POA: Diagnosis not present

## 2022-12-15 DIAGNOSIS — R7303 Prediabetes: Secondary | ICD-10-CM

## 2022-12-15 DIAGNOSIS — E063 Autoimmune thyroiditis: Secondary | ICD-10-CM

## 2022-12-15 NOTE — Progress Notes (Signed)
Patient ID: [name], female   DOB: Oct 26, 1965, 57 y.o.   MRN: [id number]   HPI  [name] is a 57 y.o.-year-old female, initially referred by her PCP, Dr. Billy Coast, returning for follow-up for hypothyroidism, diagnosed as Hashimoto's hypothyroidism.  Last visit 6 months ago.  Interim history: She started on weight watchers 57/2023.  She lost 33 pounds before her last visit on this. She only gained 4 lbs since then. She sees neurology for idiopathic polyneuropathy - on Gabapentin. She started Crestor 2 mo ago.  Reviewed and addended history: Patient was diagnosed with hypothyroidism in 11/2005 >> she initially started on Synthroid d.a.w. 75 mcg daily.  Her hypothyroidism was previously managed by Dr. Billy Coast and more recently by her PCP.  Her dose was reduced  from 112 down to 75 mcg daily, dose that she continued at our 1st visit in 05/2019.  At that time, she had several symptoms possibly related to the thyroid so we changed to Armour Thyroid. We initially started 45 mg daily in 05/2019, and we increased the dose to 60 mg daily, and then to 75 mg daily.  However, in 05/2021, per her request, we changed to Synthroid d.a.w.,  Initially 125 mcg daily, but then gradually decrease to 75 mcg daily in 11/2021.  In 12/2021, we decreased the dose to 50 mcg daily, but increase the dose back to 75 mcg daily in 02/2022.  She takes Synthroid: - in am (5 am) - fasting - 1h after: back coffee - at least 4 hours from b'fast - no Ca, Fe, PPIs - started MVI >4h after LT4 -Previously on biotin 5000 mcg daily, then came off.  Reviewed patient's TFTs: Lab Results  Component Value Date   TSH 0.776 10/05/2022   TSH 1.26 06/16/2022   TSH 1.56 04/12/2022   TSH 9.42 (H) 02/22/2022   TSH 0.09 (L) 12/30/2021   TSH 0.04 (L) 12/01/2021   TSH 0.02 (L) 10/19/2021   TSH 0.02 (L) 09/14/2021   TSH 0.03 (L) 08/05/2021   TSH 0.89 06/08/2021   FREET4 0.72 06/16/2022   FREET4 0.81 04/12/2022   FREET4  0.62 02/22/2022   FREET4 1.09 12/30/2021   FREET4 0.91 12/01/2021   FREET4 1.09 10/19/2021   FREET4 1.37 09/14/2021   FREET4 1.22 08/05/2021   FREET4 0.55 (L) 06/08/2021   FREET4 0.63 04/28/2021   T3FREE 3.3 08/05/2021   T3FREE 2.2 (L) 06/08/2021   T3FREE 4.1 04/28/2021   T3FREE 2.9 12/08/2020   T3FREE 2.8 10/30/2020   T3FREE 3.0 03/31/2020   T3FREE 2.8 10/25/2019   T3FREE 3.8 09/10/2019   T3FREE 3.2 07/05/2019   T3FREE 2.9 05/24/2019  04/02/2019: TSH 1.81 (0.4-4.0) - on 75 mcg daily 02/15/2018: TSH TSH 0.23 - on 112 mcg daily >> dose decreased to 88 mcg daily  Her antithyroid antibodies were elevated pointing towards a diagnosis of Hashimoto's thyroiditis: Component     Latest Ref Rng 05/24/2019 03/31/2020 12/08/2020  Thyroperoxidase Ab SerPl-aCnc     <9 IU/mL 98 (H)  59 (H)  105 (H)   Thyroglobulin Ab     < or = 1 IU/mL <1  <1  <1   We started selenium 200 mcg daily in 09/2019.  However, antibodies are still elevated on this so I advised her that she can stop selenium.  She described several symptoms which improved after switching to Armour Thyroid: Fatigue, foggy brain, insomnia, weight gain (50 pounds in 5 years), joint pain, heat and cold intolerance, constipation, dry skin,  hair loss, depression.  Pt denies: - feeling nodules in neck - dysphagia - choking  She has + FH of thyroid disorders in: M aunt and uncle. No FH of thyroid cancer. No h/o radiation tx to head or neck. No recent steroids use.  Previously on thyroid Energy- Ashwaganda, Iodine, L-thyroine -but stopped due to GERD. On gabapentin at night.  She had a mildly low vitamin D level, discovered in the setting of joint pains, which improved after starting supplementation: Lab Results  Component Value Date   VD25OH 67.8 10/05/2022   VD25OH 66.3 04/01/2022   VD25OH 46.6 09/29/2021   VD25OH 40.0 04/28/2021   VD25OH 28.4 (L) 03/24/2021   VD25OH 17.9 (L) 12/08/2020   VD25OH 58.0 03/31/2020   VD25OH 26.01 (L)  10/25/2019   VD25OH 26.80 (L) 05/24/2019   We started 2000 units vitamin D daily.  We increased the dose to 4000 units daily and then to 5000 units daily.  She continues on this dose today.  Pt. also has a history of anxiety and depression -on clonazepam and Lexapro.  She was started on Metformin 500 mg daily (05/2022).  However, since then, she took herself off metformin.  She occasionally checks blood sugars in the morning and they are between 90-110s.  Latest HbA1c: Lab Results  Component Value Date   HGBA1C 5.7 (H) 10/05/2022   HGBA1C 5.6 07/15/2022   HGBA1C 5.4 04/01/2022   HGBA1C 5.7 (H) 10/22/2021   In 2023, she had chest pain and had cardiac investigation, which was negative.  ROS: + See HPI  I reviewed pt's medications, allergies, PMH, social hx, family hx, and changes were documented in the history of present illness. Otherwise, unchanged from my initial visit note.  Past Medical History:  Diagnosis Date   Atrophy of thyroid (acquired)    Autoimmune thyroiditis    Chronic fatigue, unspecified    Gastro-esophageal reflux disease without esophagitis    Generalized anxiety disorder    Menopausal and female climacteric states    Migraine without aura, not intractable, without status migrainosus    Peripheral vascular disease (HCC) 1992   blood clot with broken leg, coumadin x 6 mos   Vitamin D deficiency, unspecified    Past Surgical History:  Procedure Laterality Date   Nova sure ablation  2012   rotator cuff surgery  12/2014   TONSILLECTOMY     as a child   Social History   Socioeconomic History   Marital status: Married    Spouse name: Not on file   Number of children: 46 -20 years old in 05/2019 she also takes care of her 61-year-old niece   Years of education: Not on file   Highest education level: Not on file  Occupational History    Hairstylist  Social Needs   Financial resource strain: Not on file   Food insecurity    Worry: Not on file     Inability: Not on file   Transportation needs    Medical: Not on file    Non-medical: Not on file  Tobacco Use   Smoking status: Never Smoker   Smokeless tobacco: Never Used  Substance and Sexual Activity   Alcohol use: Yes    Alcohol/week:     Types:  3 drinks per week-wine or beer   Drug use: No   Sexual activity: Yes    Birth control/protection: Surgical    Comment: husband had vasectomy   Current Outpatient Medications on File Prior to Visit  Medication Sig  Dispense Refill   acetaminophen (TYLENOL) 500 MG tablet Take 500 mg by mouth every 6 (six) hours as needed.     amoxicillin-clavulanate (AUGMENTIN) 875-125 MG tablet Take 1 tablet by mouth 2 (two) times daily.     Azelastine-Fluticasone 137-50 MCG/ACT SUSP Place 1 spray into both nostrils 2 (two) times daily.     clonazePAM (KLONOPIN) 0.5 MG tablet TAKE 1 TABLET BY MOUTH EVERYDAY AT BEDTIME 30 tablet 0   escitalopram (LEXAPRO) 20 MG tablet TAKE 1 TABLET BY MOUTH EVERY DAY 90 tablet 0   famotidine (PEPCID) 40 MG tablet Take 40 mg by mouth daily as needed for heartburn or indigestion.     fexofenadine (ALLEGRA) 180 MG tablet Take 180 mg by mouth daily.     gabapentin (NEURONTIN) 300 MG capsule Take 1 capsule AM, 2 capsules PM 270 capsule 3   metFORMIN (GLUCOPHAGE-XR) 750 MG 24 hr tablet Take 1 tablet (750 mg total) by mouth daily with breakfast. 90 tablet 3   Multiple Vitamin (MULTIVITAMIN) tablet Take 1 tablet by mouth daily.     polyethylene glycol (MIRALAX / GLYCOLAX) 17 g packet Take 17 g by mouth daily.     rivaroxaban (XARELTO) 20 MG TABS tablet Take by mouth.     rosuvastatin (CRESTOR) 5 MG tablet Take 1 tablet (5 mg total) by mouth daily. 90 tablet 0   SUMAtriptan (IMITREX) 100 MG tablet TAKE 1 TABLET BY MOUTH AT ONSET OF MIGRAINE, REPEAT DOSE IN 2 HRS IF NEEDED, MAX 2 TABS IN 24 HRS 9 tablet 2   SYNTHROID 75 MCG tablet TAKE 1 TABLET BY MOUTH DAILY BEFORE BREAKFAST. 90 tablet 1   topiramate (TOPAMAX) 25 MG tablet Take 1  tablet (25 mg total) by mouth 2 (two) times daily. 90 tablet 3   venlafaxine XR (EFFEXOR-XR) 150 MG 24 hr capsule Take by mouth.     Vitamin D, Ergocalciferol, (DRISDOL) 1.25 MG (50000 UNIT) CAPS capsule TAKE 1 CAPSULE (50,000 UNITS TOTAL) BY MOUTH EVERY 7 (SEVEN) DAYS 12 capsule 1   No current facility-administered medications on file prior to visit.   No Known Allergies   Family history:  Diabetes in mother and grandmother HTN and HL in mother and maternal aunt Heart disease in mother, maternal grandfather Breast and skin cancer in mother Also, biological father died of cancer  PE: BP 110/74 (BP Location: Left Arm, Patient Position: Sitting, Cuff Size: Normal)   Pulse (!) 51   Ht 5\' 6"  (1.676 m)   Wt 167 lb 9.6 oz (76 kg)   SpO2 99%   BMI 27.05 kg/m  Wt Readings from Last 3 Encounters:  12/15/22 167 lb 9.6 oz (76 kg)  11/01/22 165 lb (74.8 kg)  10/05/22 165 lb (74.8 kg)   Constitutional: overweight, in NAD Eyes:  EOMI, no exophthalmos ENT: no neck masses, no cervical lymphadenopathy Cardiovascular: RRR, No MRG Respiratory: CTA B Musculoskeletal: no deformities Skin:no rashes Neurological: + very faint tremor with outstretched hands  ASSESSMENT: 1. Hypothyroidism -Diagnosed as Hashimoto's hypothyroidism  2.  Vitamin D insufficiency  3. Fatigue  4.  Prediabetes  PLAN:  1. Patient with longstanding hypothyroidism, previously on levothyroxine therapy, then on Armour thyroid, now on brand-name Synthroid per her request.  She was previously on selenium, now off.  TPO antibodies were still elevated at last check. -She was on 125 mcg of levothyroxine initially (05/2021) and we then had to decrease the dose relatively rapidly to 75 mcg daily (11/2021).  Afterwards, TSH was still suppressed and  I advised her to decrease the dose to 50 mcg daily.  However, TSH returned elevated afterwards so we went back to 75 mcg daily. - latest thyroid labs reviewed with pt. >> normal: Lab  Results  Component Value Date   TSH 0.776 10/05/2022  - she continues on trach d.a.w. 75 mcg daily - pt feels good on this dose, except for fatigue, which is chronic for her.  At last visit, she was on weight watchers, with 15 pound weight loss since the previous visit and 33 pound weight loss since 08/2021.  She gained 4 pounds since then. - we discussed about taking the thyroid hormone every day, with water, >30 minutes before breakfast, separated by >4 hours from acid reflux medications, calcium, iron, multivitamins. Pt. is taking it correctly. - I will see her back in 1 year  2. vitamin D insufficiency -We checked a vitamin D level for her in the past due to generalized joint pains.  This returned slightly low -We started her on 2000 units vitamin D daily but increase to 5000 units daily afterwards and she continues on this -Vitamin D level worse recently at goal: Lab Results  Component Value Date   VD25OH 67.8 10/05/2022  -Will continue with the current dose of vitamin D  3. Fatigue -Chronic, persistent -Possibly related to menopause -she cannot use hormone replacement due to previous history of DVT/PE -Before last visit we ruled out adrenal insufficiency- a cosyntropin stimulation test and DHEA-S levels were normal  4.  Prediabetes -She asks me my opinion about this diagnosis -At this visit, we reviewed her previous HbA1c levels: 2 of them were in the prediabetic range, but very low in this range.   - She was initially on metformin, but she took herself off the medication.   - Hba1c was 5.7% at last check.   - for now, i did not recommend restarting metformin but continue to work on her diet and exercise and have her hba1c levels checked by pcp annually.  Carlus Pavlov, MD PhD [facility]

## 2022-12-15 NOTE — Patient Instructions (Addendum)
Continue Synthoid 75 mg daily.  Take the thyroid hormone every day, with water, at least 30 minutes before breakfast, separated by at least 4 hours from: - acid reflux medications - calcium - iron - multivitamins  Continue vitamin D 5000 units daily.  Please come back for a follow-up appointment in 1 year.

## 2023-01-02 ENCOUNTER — Other Ambulatory Visit: Payer: Self-pay | Admitting: Physician Assistant

## 2023-01-02 DIAGNOSIS — E782 Mixed hyperlipidemia: Secondary | ICD-10-CM

## 2023-01-03 ENCOUNTER — Other Ambulatory Visit: Payer: 59

## 2023-01-03 DIAGNOSIS — E782 Mixed hyperlipidemia: Secondary | ICD-10-CM

## 2023-01-04 LAB — COMPREHENSIVE METABOLIC PANEL
ALT: 16 IU/L (ref 0–32)
AST: 14 IU/L (ref 0–40)
Albumin/Globulin Ratio: 2.1 (ref 1.2–2.2)
Albumin: 4.4 g/dL (ref 3.8–4.9)
Alkaline Phosphatase: 59 IU/L (ref 44–121)
BUN/Creatinine Ratio: 17 (ref 9–23)
BUN: 13 mg/dL (ref 6–24)
Bilirubin Total: 0.4 mg/dL (ref 0.0–1.2)
CO2: 24 mmol/L (ref 20–29)
Calcium: 9.2 mg/dL (ref 8.7–10.2)
Chloride: 107 mmol/L — ABNORMAL HIGH (ref 96–106)
Creatinine, Ser: 0.77 mg/dL (ref 0.57–1.00)
Globulin, Total: 2.1 g/dL (ref 1.5–4.5)
Glucose: 97 mg/dL (ref 70–99)
Potassium: 4.2 mmol/L (ref 3.5–5.2)
Sodium: 140 mmol/L (ref 134–144)
Total Protein: 6.5 g/dL (ref 6.0–8.5)
eGFR: 90 mL/min/{1.73_m2} (ref >59)

## 2023-01-04 LAB — LIPID PANEL
Chol/HDL Ratio: 2.7 ratio (ref 0.0–4.4)
Cholesterol, Total: 159 mg/dL (ref 100–199)
HDL: 60 mg/dL (ref >39)
LDL Chol Calc (NIH): 82 mg/dL (ref 0–99)
Triglycerides: 94 mg/dL (ref 0–149)
VLDL Cholesterol Cal: 17 mg/dL (ref 5–40)

## 2023-01-04 LAB — CARDIOVASCULAR RISK ASSESSMENT

## 2023-01-23 ENCOUNTER — Other Ambulatory Visit: Payer: Self-pay | Admitting: Physician Assistant

## 2023-02-15 ENCOUNTER — Ambulatory Visit: Payer: 59 | Admitting: Podiatry

## 2023-02-15 ENCOUNTER — Other Ambulatory Visit: Payer: Self-pay | Admitting: Physician Assistant

## 2023-02-15 ENCOUNTER — Other Ambulatory Visit: Payer: Self-pay | Admitting: Family Medicine

## 2023-02-15 DIAGNOSIS — M722 Plantar fascial fibromatosis: Secondary | ICD-10-CM | POA: Diagnosis not present

## 2023-02-15 DIAGNOSIS — E782 Mixed hyperlipidemia: Secondary | ICD-10-CM

## 2023-02-15 MED ORDER — TRIAMCINOLONE ACETONIDE 40 MG/ML IJ SUSP
40.0000 mg | Freq: Once | INTRAMUSCULAR | Status: AC
  Administered 2023-02-15: 40 mg

## 2023-02-15 NOTE — Progress Notes (Signed)
She presents today states that the neuropathy seems to be getting worse as it moved to the hands as well now.  She goes on to say that she has had severe Planter fasciitis to the point where she has had to crawl.  Objective: Vital signs are stable alert and oriented x 3.  Pulses are palpable.  Has severe pain on palpation medial calcaneal tubercles bilateral.  Assessment: Neuropathy diagnosed with some epidermal nerve fiber biopsies.  She also has Planter fasciitis bilateral.

## 2023-02-21 ENCOUNTER — Encounter: Payer: Self-pay | Admitting: Physical Medicine and Rehabilitation

## 2023-02-21 ENCOUNTER — Encounter: Payer: 59 | Attending: Physical Medicine and Rehabilitation | Admitting: Physical Medicine and Rehabilitation

## 2023-02-21 VITALS — BP 129/81 | HR 65 | Ht 66.0 in | Wt 167.0 lb

## 2023-02-21 DIAGNOSIS — E663 Overweight: Secondary | ICD-10-CM | POA: Insufficient documentation

## 2023-02-21 DIAGNOSIS — G6289 Other specified polyneuropathies: Secondary | ICD-10-CM | POA: Diagnosis present

## 2023-02-21 DIAGNOSIS — R7303 Prediabetes: Secondary | ICD-10-CM | POA: Insufficient documentation

## 2023-02-21 MED ORDER — GABAPENTIN 300 MG PO CAPS: ORAL_CAPSULE | ORAL | 3 refills | Status: DC

## 2023-02-21 MED ORDER — TOPIRAMATE 25 MG PO TABS: 25.0000 mg | ORAL_TABLET | Freq: Three times a day (TID) | ORAL | 3 refills | Status: DC

## 2023-02-21 NOTE — Progress Notes (Signed)
Subjective:    Patient ID: [name], female    DOB: June 28, 1966, 57 y.o.   MRN: [id number]  HPI Mrs. Stephney is a 57 year year old woman presenting for follow-up of peripheral neuropathy.   1) Peripheral neuropathy -had an allergic reaction to Qutenza -metformin helps -has been rough month with the feet burning and aching -she has been rubbing lotion on the feet- blue emu oil, has been mixing with lidocaine -started back hurting in her toes and ankles -has an appointment with Dr. Blain Pais neurology as it is moving into her fingers -has been present for a year -she is reading my pain journal book and she loves blueberries and cherries -she has never been diagnosed with diabetes -pain is present in forefoot and top of toes -the Qutenza hurt very badly last time so does not want to repeat -toes are constantly tingling -she feels sharp pains like knives -she has tried the TENS unit- this does help ease it. -she takes an extra gabapentin at night if the pain is really bad  2) Overweight -she has tried Weight Watchers and lost 21 bs but this did not help her pain Current BMI is 28.25 and weight is 169 -has lost weight due to the topamax  3) Plantar fasciitis: -she was diagnosed with plantar fasciitis and this was treated.   4) Prediabetes -discussed HgbA1c with her -her PCP was unhappy that she was started on metformin since her hemoglobin A1c  5) Insomnia:  -currently topamax BID helping Pain Inventory Average Pain 5 Pain Right Now 10 My pain is intermittent, sharp, burning, dull, stabbing, tingling, and aching  In the last 24 hours, has pain interfered with the following? General activity 9 Relation with others 9 Enjoyment of life 9 What TIME of day is your pain at its worst? morning , evening, and night Sleep (in general) Poor  Pain is worse with: walking, sitting, inactivity, and standing Pain improves with: rest and medication Relief from Meds:  3     Family History  Problem Relation Age of Onset   Hypertension Mother    Hyperlipidemia Mother    Breast cancer Mother    Melanoma Mother    Migraines Mother    Diabetes type II Mother    Lung cancer Father    Hypertension Sister    Heart attack Maternal Grandmother    Heart attack Maternal Grandfather    Stroke Maternal Grandfather    Social History   Socioeconomic History   Marital status: Married    Spouse name: Bruna Bush   Number of children: 2   Years of education: 14   Highest education level: Associate degree: occupational, Scientist, product/process development, or vocational program  Occupational History   Occupation: Hair stylist  Tobacco Use   Smoking status: Never   Smokeless tobacco: Never  Vaping Use   Vaping Use: Never used  Substance and Sexual Activity   Alcohol use: Yes    Alcohol/week: 1.0 standard drink of alcohol    Types: 1 Glasses of wine per week   Drug use: Never   Sexual activity: Yes    Partners: Male    Birth control/protection: Surgical  Other Topics Concern   Not on file  Social History Narrative   Right-handedness   One-story home   Social Determinants of Health   Financial Resource Strain: Not on file  Food Insecurity: Not on file  Transportation Needs: Not on file  Physical Activity: Not on file  Stress: Not on file  Social Connections: Not on file   Past Surgical History:  Procedure Laterality Date   Sander Radon sure ablation  2012   rotator cuff surgery  12/2014   TONSILLECTOMY     as a child   Past Medical History:  Diagnosis Date   Atrophy of thyroid (acquired)    Autoimmune thyroiditis    Chronic fatigue, unspecified    Gastro-esophageal reflux disease without esophagitis    Generalized anxiety disorder    Menopausal and female climacteric states    Migraine without aura, not intractable, without status migrainosus    Peripheral vascular disease (HCC) 1992   blood clot with broken leg, coumadin x 6 mos   Vitamin D deficiency,  unspecified    There were no vitals taken for this visit.  Opioid Risk Score:   Fall Risk Score:  `1  Depression screen [facility] 2/9     11/01/2022   11:08 AM 07/15/2022    9:23 AM 10/22/2021    9:23 AM 09/29/2021    8:40 AM 03/24/2021    8:17 AM 12/15/2020   10:31 AM 12/15/2020   10:28 AM  Depression screen [facility] 2/9  Decreased Interest 0 0 0 0 2 0 0  Down, Depressed, Hopeless 0 0 1 0 0 0 0  PHQ - 2 Score 0 0 1 0 2 0 0  Altered sleeping   0  3    Tired, decreased energy   3  3    Change in appetite   0  1    Feeling bad or failure about yourself    0  3    Trouble concentrating   2  1    Moving slowly or fidgety/restless   0  0    Suicidal thoughts   0  0    PHQ-9 Score   6  13    Difficult doing work/chores   Somewhat difficult  Not difficult at all       Review of Systems  Gastrointestinal:  Positive for constipation.  Musculoskeletal:  Positive for gait problem.       LT foot pain spasms  Neurological:  Positive for weakness and numbness.  Psychiatric/Behavioral:  Positive for confusion and dysphoric mood. The patient is nervous/anxious.   All other systems reviewed and are negative.      Objective:   Physical Exam Gen: no distress, normal appearing HEENT: oral mucosa pink and moist, NCAT Cardio: Reg rate Chest: normal effort, normal rate of breathing Abd: soft, non-distended Ext: no edema Psych: pleasant, normal affect Skin: intact Neuro: Alert and oriented x3    Assessment & Plan:  1) Peripheral neuropathy -will defer Qutenza -hgbA1c ordered.  -f/u with neurology -prescribed topamax, increase topamax TID -Provided with a pain relief journal and discussed that it contains foods and lifestyle tips to naturally help to improve pain. Discussed that these lifestyle strategies are also very good for health unlike some medications which can have negative side effects. Discussed that the act of keeping a journal can be therapeutic and helpful to realize patterns what helps  to trigger and alleviate pain.    2) Overweight: -Discussed the benefits of intermittent fasting. Recommended starting with pushing dinner 15 minutes earlier and when this feels easy, continuing to push dinner 15 minutes earlier. Discussed that this can help her body to improve its ability to burn fat rather than glucose, improving insulin sensitivity. Recommended drinking Roobois tea in the evening to help curb appetite and for its numerous health benefits.  Commended on 9 lb weight loss! Discussed current weight is 165 lbs.  -prescribed topamax, increase to TID  3) Prediabetes: -d/c metformin  -repeat HgbA1c reviewed and shows elevation to 5.7.  -discussed discussing with endocrinologists -Discussed the benefits of intermittent fasting. Recommended starting with pushing dinner 15 minutes earlier and when this feels easy, continuing to push dinner 15 minutes earlier. Discussed that this can help her body to improve its ability to burn fat rather than glucose, improving insulin sensitivity. Recommended drinking Roobois tea in the evening to help curb appetite and for its numerous health benefits.   -discussed benefits of continuous blood glucose monitoring.

## 2023-02-21 NOTE — Patient Instructions (Signed)
Red light therapy 

## 2023-03-02 ENCOUNTER — Other Ambulatory Visit: Payer: Self-pay | Admitting: Internal Medicine

## 2023-03-02 DIAGNOSIS — E039 Hypothyroidism, unspecified: Secondary | ICD-10-CM

## 2023-04-06 ENCOUNTER — Encounter: Payer: Self-pay | Admitting: Physician Assistant

## 2023-04-06 ENCOUNTER — Ambulatory Visit: Payer: 59 | Admitting: Physician Assistant

## 2023-04-06 VITALS — BP 108/72 | HR 60 | Temp 97.9°F | Ht 66.0 in | Wt 170.0 lb

## 2023-04-06 DIAGNOSIS — E782 Mixed hyperlipidemia: Secondary | ICD-10-CM | POA: Diagnosis not present

## 2023-04-06 DIAGNOSIS — E063 Autoimmune thyroiditis: Secondary | ICD-10-CM

## 2023-04-06 DIAGNOSIS — F419 Anxiety disorder, unspecified: Secondary | ICD-10-CM

## 2023-04-06 DIAGNOSIS — E559 Vitamin D deficiency, unspecified: Secondary | ICD-10-CM | POA: Diagnosis not present

## 2023-04-06 DIAGNOSIS — M792 Neuralgia and neuritis, unspecified: Secondary | ICD-10-CM

## 2023-04-06 DIAGNOSIS — E038 Other specified hypothyroidism: Secondary | ICD-10-CM

## 2023-04-06 DIAGNOSIS — R7303 Prediabetes: Secondary | ICD-10-CM

## 2023-04-06 NOTE — Progress Notes (Signed)
Subjective:  Patient ID: [name], female    DOB: 1966-05-15  Age: 57 y.o. MRN: [id number]  Chief Complaint  Patient presents with   Medical Management of Chronic Issues    Hyperlipidemia  Anxiety      Pt with history of hypothyroidism - currently on synthroid 75 mcg and follows with endocrinology Dr Reino Kent - voices no problems or concerns  Pt with history of anxiety -  currently stable on medication klonopin and lexapro 20mg  - she has not had any breakthrough symptoms recently  Pt with history of vit D deficiency - pt states she has not been taking the supplement for awhile because she did not get refill of medication  Pt with history of hyperlipidemia - does watch diet - pt states she has restarted crestor 5mg  and watching diet  Pt is being followed by specialist for chronic foot pain/neuralgia - she is taking gabapentin for pain which is being managed at this time Current Outpatient Medications on File Prior to Visit  Medication Sig Dispense Refill   clonazePAM (KLONOPIN) 0.5 MG tablet TAKE 1 TABLET BY MOUTH EVERYDAY AT BEDTIME 30 tablet 0   famotidine (PEPCID) 40 MG tablet Take 40 mg by mouth daily as needed for heartburn or indigestion.     fexofenadine (ALLEGRA) 180 MG tablet Take 180 mg by mouth daily.     gabapentin (NEURONTIN) 300 MG capsule Take 1 capsule AM, 2 capsules PM 270 capsule 3   polyethylene glycol (MIRALAX / GLYCOLAX) 17 g packet Take 17 g by mouth daily.     rosuvastatin (CRESTOR) 5 MG tablet TAKE 1 TABLET (5 MG TOTAL) BY MOUTH DAILY. 90 tablet 0   SUMAtriptan (IMITREX) 100 MG tablet TAKE 1 TABLET BY MOUTH AT ONSET OF MIGRAINE, REPEAT DOSE IN 2 HRS IF NEEDED, MAX 2 TABS IN 24 HRS 9 tablet 2   SYNTHROID 75 MCG tablet TAKE 1 TABLET BY MOUTH DAILY BEFORE BREAKFAST. 90 tablet 1   topiramate (TOPAMAX) 25 MG tablet Take 1 tablet (25 mg total) by mouth in the morning, at noon, and at bedtime. 270 tablet 3   No current facility-administered medications on  file prior to visit.   Past Medical History:  Diagnosis Date   Atrophy of thyroid (acquired)    Autoimmune thyroiditis    Chronic fatigue, unspecified    Gastro-esophageal reflux disease without esophagitis    Generalized anxiety disorder    Menopausal and female climacteric states    Migraine without aura, not intractable, without status migrainosus    Peripheral vascular disease (HCC) 1992   blood clot with broken leg, coumadin x 6 mos   Vitamin D deficiency, unspecified    Past Surgical History:  Procedure Laterality Date   Nova sure ablation  2012   rotator cuff surgery  12/2014   TONSILLECTOMY     as a child    Family History  Problem Relation Age of Onset   Hypertension Mother    Hyperlipidemia Mother    Breast cancer Mother    Melanoma Mother    Migraines Mother    Diabetes type II Mother    Lung cancer Father    Hypertension Sister    Heart attack Maternal Grandmother    Heart attack Maternal Grandfather    Stroke Maternal Grandfather    Social History   Socioeconomic History   Marital status: Married    Spouse name: Derenda Morriss   Number of children: 2   Years of education: 96  Highest education level: Associate degree: occupational, Scientist, product/process development, or vocational program  Occupational History   Occupation: Hair stylist  Tobacco Use   Smoking status: Never   Smokeless tobacco: Never  Vaping Use   Vaping status: Never Used  Substance and Sexual Activity   Alcohol use: Yes    Alcohol/week: 1.0 standard drink of alcohol    Types: 1 Glasses of wine per week   Drug use: Never   Sexual activity: Yes    Partners: Male    Birth control/protection: Surgical  Other Topics Concern   Not on file  Social History Narrative   Right-handedness   One-story home   Social Determinants of Health   Financial Resource Strain: Low Risk  (04/06/2023)   Overall Financial Resource Strain (CARDIA)    Difficulty of Paying Living Expenses: Not hard at all  Food Insecurity:  No Food Insecurity (04/06/2023)   Hunger Vital Sign    Worried About Running Out of Food in the Last Year: Never true    Ran Out of Food in the Last Year: Never true  Transportation Needs: No Transportation Needs (04/06/2023)   PRAPARE - Administrator, Civil Service (Medical): No    Lack of Transportation (Non-Medical): No  Physical Activity: Inactive (04/06/2023)   Exercise Vital Sign    Days of Exercise per Week: 0 days    Minutes of Exercise per Session: 0 min  Stress: No Stress Concern Present (04/06/2023)   [employer] of Occupational Health - Occupational Stress Questionnaire    Feeling of Stress : Not at all  Social Connections: Unknown (04/06/2023)   Social Connection and Isolation Panel [NHANES]    Frequency of Communication with Friends and Family: More than three times a week    Frequency of Social Gatherings with Friends and Family: Three times a week    Attends Religious Services: Patient declined    Active Member of Clubs or Organizations: No    Attends Banker Meetings: Never    Marital Status: Married    CONSTITUTIONAL: Negative for chills, fatigue, fever, unintentional weight gain and unintentional weight loss.  E/N/T: Negative for ear pain, nasal congestion and sore throat.  CARDIOVASCULAR: Negative for chest pain, dizziness, palpitations and pedal edema.  RESPIRATORY: Negative for recent cough and dyspnea.  GASTROINTESTINAL: Negative for abdominal pain, acid reflux symptoms, constipation, diarrhea, nausea and vomiting.  MSK: Negative for arthralgias and myalgias.  INTEGUMENTARY: Negative for rash.  NEUROLOGICAL: Negative for dizziness and headaches.  PSYCHIATRIC: Negative for sleep disturbance and to question depression screen.  Negative for depression, negative for anhedonia.       Objective:  PHYSICAL EXAM:   VS: BP 108/72   Pulse 60   Temp 97.9 F (36.6 C)   Ht 5\' 6"  (1.676 m)   Wt 170 lb (77.1 kg)   SpO2 99%   BMI 27.44  kg/m   GEN: Well nourished, well developed, in no acute distress   Cardiac: RRR; no murmurs, rubs, or gallops,no edema - Respiratory:  normal respiratory rate and pattern with no distress - normal breath sounds with no rales, rhonchi, wheezes or rubs  MS: no deformity or atrophy  Skin: warm and dry, no rash   Psych: euthymic mood, appropriate affect and demeanor  Lab Results  Component Value Date   WBC 7.1 10/05/2022   HGB 13.2 10/05/2022   HCT 39.1 10/05/2022   PLT 278 10/05/2022   GLUCOSE 97 01/03/2023   CHOL 159 01/03/2023   TRIG  94 01/03/2023   HDL 60 01/03/2023   LDLCALC 82 01/03/2023   ALT 16 01/03/2023   AST 14 01/03/2023   NA 140 01/03/2023   K 4.2 01/03/2023   CL 107 (H) 01/03/2023   CREATININE 0.77 01/03/2023   BUN 13 01/03/2023   CO2 24 01/03/2023   TSH 0.776 10/05/2022   HGBA1C 5.7 (H) 10/05/2022      Assessment & Plan:   Problem List Items Addressed This Visit       Endocrine   Acquired hypothyroidism Continue follow up with endocrinologist Continue syntrhoid qd Labwork pending     Other   Vitamin D insufficiency   Relevant Orders   VITAMIN D 25 Hydroxy (Vit-D Deficiency, Fractures)    Other Visit Diagnoses     Neuralgia    -  Primary   Relevant Orders   CBC with Differential/Platelet   Comprehensive metabolic panel Continue gabapentin Follow up with specialist as directed   Anxiety     Continue current meds   Mixed hyperlipidemia       Relevant Orders   CBC with Differential/Platelet   Comprehensive metabolic panel   Lipid panel Watch diet  Prediabetes Continue to watch diet  Follow up with endocrinologist as directed      .  No orders of the defined types were placed in this encounter.   Orders Placed This Encounter  Procedures   CBC with Differential/Platelet   Comprehensive metabolic panel   TSH   Lipid panel   VITAMIN D 25 Hydroxy (Vit-D Deficiency, Fractures)   Hemoglobin A1c   T4, Free   T3, Free      Follow-up: Return in about 6 months (around 10/07/2023) for chronic fasting follow-up.  An After Visit Summary was printed and given to the patient.  Jettie Pagan [facility] [phone]

## 2023-04-07 LAB — COMPREHENSIVE METABOLIC PANEL
ALT: 15 IU/L (ref 0–32)
AST: 19 IU/L (ref 0–40)
Albumin: 4.2 g/dL (ref 3.8–4.9)
Alkaline Phosphatase: 66 IU/L (ref 44–121)
BUN/Creatinine Ratio: 22 (ref 9–23)
BUN: 14 mg/dL (ref 6–24)
Bilirubin Total: 0.3 mg/dL (ref 0.0–1.2)
CO2: 23 mmol/L (ref 20–29)
Calcium: 9.1 mg/dL (ref 8.7–10.2)
Chloride: 108 mmol/L — ABNORMAL HIGH (ref 96–106)
Creatinine, Ser: 0.64 mg/dL (ref 0.57–1.00)
Globulin, Total: 2.3 g/dL (ref 1.5–4.5)
Glucose: 92 mg/dL (ref 70–99)
Potassium: 4.6 mmol/L (ref 3.5–5.2)
Sodium: 142 mmol/L (ref 134–144)
Total Protein: 6.5 g/dL (ref 6.0–8.5)
eGFR: 104 mL/min/{1.73_m2} (ref >59)

## 2023-04-07 LAB — CBC WITH DIFFERENTIAL/PLATELET
Basophils Absolute: 0 10*3/uL (ref 0.0–0.2)
Basos: 1 %
EOS (ABSOLUTE): 0.1 10*3/uL (ref 0.0–0.4)
Eos: 2 %
Hematocrit: 39 % (ref 34.0–46.6)
Hemoglobin: 12.7 g/dL (ref 11.1–15.9)
Immature Grans (Abs): 0 10*3/uL (ref 0.0–0.1)
Immature Granulocytes: 0 %
Lymphocytes Absolute: 2 10*3/uL (ref 0.7–3.1)
Lymphs: 29 %
MCH: 31.2 pg (ref 26.6–33.0)
MCHC: 32.6 g/dL (ref 31.5–35.7)
MCV: 96 fL (ref 79–97)
Monocytes Absolute: 0.5 10*3/uL (ref 0.1–0.9)
Monocytes: 7 %
Neutrophils Absolute: 4.4 10*3/uL (ref 1.4–7.0)
Neutrophils: 61 %
Platelets: 255 10*3/uL (ref 150–450)
RBC: 4.07 x10E6/uL (ref 3.77–5.28)
RDW: 12.5 % (ref 11.7–15.4)
WBC: 7.1 10*3/uL (ref 3.4–10.8)

## 2023-04-07 LAB — T4, FREE: Free T4: 1.12 ng/dL (ref 0.82–1.77)

## 2023-04-07 LAB — LIPID PANEL
Chol/HDL Ratio: 2.7 ratio (ref 0.0–4.4)
Cholesterol, Total: 177 mg/dL (ref 100–199)
HDL: 66 mg/dL (ref >39)
LDL Chol Calc (NIH): 97 mg/dL (ref 0–99)
Triglycerides: 77 mg/dL (ref 0–149)
VLDL Cholesterol Cal: 14 mg/dL (ref 5–40)

## 2023-04-07 LAB — HEMOGLOBIN A1C
Est. average glucose Bld gHb Est-mCnc: 120 mg/dL
Hgb A1c MFr Bld: 5.8 % — ABNORMAL HIGH (ref 4.8–5.6)

## 2023-04-07 LAB — TSH: TSH: 0.874 u[IU]/mL (ref 0.450–4.500)

## 2023-04-07 LAB — VITAMIN D 25 HYDROXY (VIT D DEFICIENCY, FRACTURES): Vit D, 25-Hydroxy: 39.9 ng/mL (ref 30.0–100.0)

## 2023-04-07 LAB — T3, FREE: T3, Free: 2.3 pg/mL (ref 2.0–4.4)

## 2023-04-10 ENCOUNTER — Other Ambulatory Visit: Payer: Self-pay | Admitting: Physical Medicine and Rehabilitation

## 2023-04-22 ENCOUNTER — Other Ambulatory Visit: Payer: Self-pay | Admitting: Physician Assistant

## 2023-04-28 ENCOUNTER — Encounter: Payer: Self-pay | Admitting: Physician Assistant

## 2023-05-01 ENCOUNTER — Other Ambulatory Visit: Payer: Self-pay | Admitting: Physician Assistant

## 2023-05-10 ENCOUNTER — Other Ambulatory Visit: Payer: Self-pay | Admitting: Physical Medicine and Rehabilitation

## 2023-05-24 ENCOUNTER — Encounter: Payer: 59 | Attending: Physical Medicine and Rehabilitation | Admitting: Physical Medicine and Rehabilitation

## 2023-05-24 VITALS — BP 109/64 | HR 70 | Ht 66.0 in | Wt 172.0 lb

## 2023-05-24 DIAGNOSIS — G6289 Other specified polyneuropathies: Secondary | ICD-10-CM | POA: Diagnosis not present

## 2023-05-24 DIAGNOSIS — Z7409 Other reduced mobility: Secondary | ICD-10-CM | POA: Insufficient documentation

## 2023-05-24 DIAGNOSIS — R7303 Prediabetes: Secondary | ICD-10-CM | POA: Diagnosis not present

## 2023-05-24 DIAGNOSIS — E663 Overweight: Secondary | ICD-10-CM | POA: Diagnosis not present

## 2023-05-24 DIAGNOSIS — W19XXXS Unspecified fall, sequela: Secondary | ICD-10-CM | POA: Diagnosis not present

## 2023-05-24 DIAGNOSIS — R2681 Unsteadiness on feet: Secondary | ICD-10-CM | POA: Insufficient documentation

## 2023-05-24 DIAGNOSIS — G43811 Other migraine, intractable, with status migrainosus: Secondary | ICD-10-CM | POA: Diagnosis present

## 2023-05-24 DIAGNOSIS — M722 Plantar fascial fibromatosis: Secondary | ICD-10-CM | POA: Diagnosis not present

## 2023-05-24 DIAGNOSIS — G4700 Insomnia, unspecified: Secondary | ICD-10-CM | POA: Insufficient documentation

## 2023-05-24 MED ORDER — GABAPENTIN 300 MG PO CAPS: 300.0000 mg | ORAL_CAPSULE | Freq: Two times a day (BID) | ORAL | 3 refills | Status: DC

## 2023-05-24 MED ORDER — TOPIRAMATE 25 MG PO TABS: 25.0000 mg | ORAL_TABLET | Freq: Four times a day (QID) | ORAL | 1 refills | Status: DC

## 2023-05-24 NOTE — Progress Notes (Signed)
Subjective:    Patient ID: [name], female    DOB: 1965-08-29, 57 y.o.   MRN: [id number]  HPI [name] is a 61 year year old woman presenting for follow-up of peripheral neuropathy.   1) Peripheral neuropathy -had an allergic reaction to Qutenza -pain has been stable -at some times it is worse than others.  -metformin helps -has been rough month with the feet burning and aching -she has been rubbing lotion on the feet- blue emu oil, has been mixing with lidocaine -started back hurting in her toes and ankles -has an appointment with Dr. Blain Pais neurology as it is moving into her fingers -has been present for a year -she is reading my pain journal book and she loves blueberries and cherries -she has never been diagnosed with diabetes -pain is present in forefoot and top of toes -the Qutenza hurt very badly last time so does not want to repeat -toes are constantly tingling -she feels sharp pains like knives -she has tried the TENS unit- this does help ease it. -she takes an extra gabapentin at night if the pain is really bad  2) Overweight -she has tried Weight Watchers and lost 21 bs but this did not help her pain Current BMI is 28.25 and weight is 169 -has lost weight due to the topamax  3) Plantar fasciitis: -she was diagnosed with plantar fasciitis and this was treated.   4) Prediabetes -discussed HgbA1c with her -her PCP was unhappy that she was started on metformin since her hemoglobin A1c  5) Insomnia:  -currently topamax BID helping  6) Migraines: -worse with the weather -has never had food allergy blood test  7) Fall: -had a hard fall Pain Inventory Average Pain 5 Pain Right Now 5 My pain is intermittent, sharp, burning, stabbing, tingling, and aching  In the last 24 hours, has pain interfered with the following? General activity 10 Relation with others 10 Enjoyment of life 10 What TIME of day is your pain at its worst? morning ,  daytime, evening, and night Sleep (in general) Fair  Pain is worse with: walking, sitting, inactivity, and standing Pain improves with: rest and medication Relief from Meds: 3     Family History  Problem Relation Age of Onset   Hypertension Mother    Hyperlipidemia Mother    Breast cancer Mother    Melanoma Mother    Migraines Mother    Diabetes type II Mother    Lung cancer Father    Hypertension Sister    Heart attack Maternal Grandmother    Heart attack Maternal Grandfather    Stroke Maternal Grandfather    Social History   Socioeconomic History   Marital status: Married    Spouse name: [name]   Number of children: 2   Years of education: 14   Highest education level: Associate degree: occupational, Scientist, product/process development, or vocational program  Occupational History   Occupation: Hair stylist  Tobacco Use   Smoking status: Never   Smokeless tobacco: Never  Vaping Use   Vaping status: Never Used  Substance and Sexual Activity   Alcohol use: Yes    Alcohol/week: 1.0 standard drink of alcohol    Types: 1 Glasses of wine per week   Drug use: Never   Sexual activity: Yes    Partners: Male    Birth control/protection: Surgical  Other Topics Concern   Not on file  Social History Narrative   ** Merged History Encounter **  Right-handedness One-story home   Social Determinants of Health   Financial Resource Strain: Low Risk  (04/06/2023)   Overall Financial Resource Strain (CARDIA)    Difficulty of Paying Living Expenses: Not hard at all  Food Insecurity: No Food Insecurity (04/06/2023)   Hunger Vital Sign    Worried About Running Out of Food in the Last Year: Never true    Ran Out of Food in the Last Year: Never true  Transportation Needs: No Transportation Needs (04/06/2023)   PRAPARE - Administrator, Civil Service (Medical): No    Lack of Transportation (Non-Medical): No  Physical Activity: Inactive (04/06/2023)   Exercise Vital Sign    Days of  Exercise per Week: 0 days    Minutes of Exercise per Session: 0 min  Stress: No Stress Concern Present (04/06/2023)   [employer] of Occupational Health - Occupational Stress Questionnaire    Feeling of Stress : Not at all  Social Connections: Unknown (04/06/2023)   Social Connection and Isolation Panel [NHANES]    Frequency of Communication with Friends and Family: More than three times a week    Frequency of Social Gatherings with Friends and Family: Three times a week    Attends Religious Services: Patient declined    Active Member of Clubs or Organizations: No    Attends Banker Meetings: Never    Marital Status: Married   Past Surgical History:  Procedure Laterality Date   Sander Radon sure ablation  2012   rotator cuff surgery  12/2014   TONSILLECTOMY     as a child   Past Medical History:  Diagnosis Date   Atrophy of thyroid (acquired)    Autoimmune thyroiditis    Chronic fatigue, unspecified    Gastro-esophageal reflux disease without esophagitis    Generalized anxiety disorder    Menopausal and female climacteric states    Migraine without aura, not intractable, without status migrainosus    Peripheral vascular disease (HCC) 1992   blood clot with broken leg, coumadin x 6 mos   Vitamin D deficiency, unspecified    BP 109/64   Pulse 70   Ht 5\' 6"  (1.676 m)   Wt 172 lb (78 kg)   SpO2 100%   BMI 27.76 kg/m   Opioid Risk Score:   Fall Risk Score:  `1  Depression screen [facility] 2/9     04/06/2023    9:03 AM 02/21/2023    9:30 AM 11/01/2022   11:08 AM 07/15/2022    9:23 AM 10/22/2021    9:23 AM 09/29/2021    8:40 AM 03/24/2021    8:17 AM  Depression screen PHQ 2/9  Decreased Interest 0 0 0 0 0 0 2  Down, Depressed, Hopeless 0 0 0 0 1 0 0  PHQ - 2 Score 0 0 0 0 1 0 2  Altered sleeping 2    0  3  Tired, decreased energy 2    3  3   Change in appetite 2    0  1  Feeling bad or failure about yourself  0    0  3  Trouble concentrating 2    2  1   Moving slowly  or fidgety/restless 0    0  0  Suicidal thoughts 0    0  0  PHQ-9 Score 8    6  13   Difficult doing work/chores Not difficult at all    Somewhat difficult  Not difficult at all  Review of Systems  Gastrointestinal:  Positive for constipation.  Musculoskeletal:  Positive for gait problem.       LT foot pain spasms  Neurological:  Positive for weakness and numbness.  Psychiatric/Behavioral:  Positive for confusion and dysphoric mood. The patient is nervous/anxious.   All other systems reviewed and are negative.      Objective:   Physical Exam Gen: no distress, normal appearing HEENT: oral mucosa pink and moist, NCAT Cardio: Reg rate Chest: normal effort, normal rate of breathing Abd: soft, non-distended Ext: no edema Psych: pleasant, normal affect Skin: intact Neuro: Alert and oriented x3    Assessment & Plan:  1) Peripheral neuropathy -will defer Qutenza -will defer metformin as her other physician does not want her on this -hgbA1c reviewed and is 5.8 -f/u with neurology -prescribed topamax, increase topamax TID -Provided with a pain relief journal and discussed that it contains foods and lifestyle tips to naturally help to improve pain. Discussed that these lifestyle strategies are also very good for health unlike some medications which can have negative side effects. Discussed that the act of keeping a journal can be therapeutic and helpful to realize patterns what helps to trigger and alleviate pain.    2) Overweight: -Discussed the benefits of intermittent fasting. Recommended starting with pushing dinner 15 minutes earlier and when this feels easy, continuing to push dinner 15 minutes earlier. Discussed that this can help her body to improve its ability to burn fat rather than glucose, improving insulin sensitivity. Recommended drinking Roobois tea in the evening to help curb appetite and for its numerous health benefits.   Commended on 9 lb weight loss! Discussed  current weight is 165 lbs.  -prescribed topamax, increase to TID -discussed that weight has been stable -discussed that the weight watchers clinic was very helpful but the online app is not as helpful.  3) Prediabetes: -d/c metformin  -discussed berberine 500mg  TID -repeat HgbA1c reviewed and shows elevation to 5.8.  -discussed discussing with endocrinologists -Discussed the benefits of intermittent fasting. Recommended starting with pushing dinner 15 minutes earlier and when this feels easy, continuing to push dinner 15 minutes earlier. Discussed that this can help her body to improve its ability to burn fat rather than glucose, improving insulin sensitivity. Recommended drinking Roobois tea in the evening to help curb appetite and for its numerous health benefits.   -discussed benefits of continuous blood glucose monitoring.   4) Fall -discussed that recent fall was due to balance issue  5) Impaired balance: -discussed that the gabapentin and topamax can worsen the balance

## 2023-05-24 NOTE — Patient Instructions (Signed)
Berberine 500mg  TID

## 2023-06-07 ENCOUNTER — Other Ambulatory Visit: Payer: Self-pay | Admitting: Physician Assistant

## 2023-06-08 ENCOUNTER — Other Ambulatory Visit: Payer: Self-pay | Admitting: Physician Assistant

## 2023-06-08 ENCOUNTER — Telehealth: Payer: Self-pay

## 2023-06-08 MED ORDER — ESCITALOPRAM OXALATE 20 MG PO TABS
20.0000 mg | ORAL_TABLET | Freq: Every day | ORAL | 0 refills | Status: DC
Start: 2023-06-08 — End: 2023-09-05

## 2023-06-08 NOTE — Telephone Encounter (Signed)
Patient called and stated that her refill for her Lexapro was denied. She states that she does know she originally had spoken to you about trying to wean off the medication but she states that she tried and she can't go without the medication. She states that She has still been taking the medication at night time and needs a refill of the medication sent to [facility] [place].

## 2023-06-08 NOTE — Telephone Encounter (Signed)
Rx sent 

## 2023-06-08 NOTE — Telephone Encounter (Signed)
The patient said 20 mg.

## 2023-06-08 NOTE — Telephone Encounter (Signed)
Actually is she needing 20mg  sent in or 10mg  sent since she was trying to wean off?

## 2023-07-01 ENCOUNTER — Other Ambulatory Visit: Payer: Self-pay | Admitting: Physician Assistant

## 2023-07-01 DIAGNOSIS — E782 Mixed hyperlipidemia: Secondary | ICD-10-CM

## 2023-07-09 ENCOUNTER — Other Ambulatory Visit: Payer: Self-pay | Admitting: Physician Assistant

## 2023-08-01 ENCOUNTER — Encounter: Payer: Self-pay | Admitting: Family Medicine

## 2023-08-01 ENCOUNTER — Ambulatory Visit: Payer: 59 | Admitting: Family Medicine

## 2023-08-01 VITALS — BP 120/72 | HR 76 | Temp 97.3°F | Ht 66.0 in | Wt 174.0 lb

## 2023-08-01 DIAGNOSIS — M792 Neuralgia and neuritis, unspecified: Secondary | ICD-10-CM | POA: Insufficient documentation

## 2023-08-01 MED ORDER — LIDOCAINE VISCOUS HCL 2 % MT SOLN: OROMUCOSAL | 0 refills | Status: DC

## 2023-08-01 NOTE — Assessment & Plan Note (Signed)
  Assessment & Plan Oral Pain - no open ulcers.  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 Pain is causing increased anxiety and decreased oral intake. -Increase Gabapentin to 600mg  twice daily for neuropathic pain. -Prescribe viscous lidocaine for temporary relief of oral pain.  Anxiety Increased anxiety due to oral pain. Currently managed with Klonopin. -Continue Klonopin as needed for anxiety.  Follow-up with primary care provider in February.

## 2023-08-01 NOTE — Progress Notes (Signed)
Acute Office Visit  Subjective:    Patient ID: [name], female    DOB: [birth year], 57 y.o.   MRN: [id number]  Chief Complaint  Patient presents with   Oral blisters    Discussed the use of AI scribe software for clinical note transcription with the patient, who gave verbal consent to proceed.  HPI: Patient is in today for oral blisters on the right side of her mouth which started in October. Also had a titanium dental implant and thought maybe she was allergic. States oral surgeon suggested possibly shingles. States she has blisters and feels like her throat is raw, currently taking gabapentin but still has numbness and tingling in her mouth and throat. Does not recall ever having chicken pox as a child but lab work up has confirmed she has antibodies. Patient held her gabapentin for a period of time and the burning worsened.   History of Present Illness The patient, with a history of neuropathy and Hashimoto's thyroiditis, presents with oral blisters on the right side of her mouth that started in October. She initially thought she might be allergic to her titanium dental implant as she experienced a strong metallic taste in her mouth after the implant. The patient stopped taking gabapentin, which she has been on for years for neuropathy, to see if it was causing the metallic taste. However, the oral blisters and a burning sensation worsened after stopping gabapentin. The patient's oral surgeon suggested the possibility of shingles. The patient also reports that her neuropathy in her legs worsened after stopping gabapentin. She has been taking more Klonopin, prescribed for anxiety, due to the discomfort in her mouth. The patient describes her mouth as raw and the burning sensation has somewhat calmed down. The patient also mentions that she has Hashimoto's thyroiditis but her thyroid levels have been normal since 2023.  Past Medical History:  Diagnosis Date   Atrophy of thyroid  (acquired)    Autoimmune thyroiditis    Chronic fatigue, unspecified    Gastro-esophageal reflux disease without esophagitis    Generalized anxiety disorder    Menopausal and female climacteric states    Migraine without aura, not intractable, without status migrainosus    Peripheral vascular disease (HCC) 1992   blood clot with broken leg, coumadin x 6 mos   Vitamin D deficiency, unspecified     Past Surgical History:  Procedure Laterality Date   Nova sure ablation  2012   rotator cuff surgery  12/2014   TONSILLECTOMY     as a child    Family History  Problem Relation Age of Onset   Hypertension Mother    Hyperlipidemia Mother    Breast cancer Mother    Melanoma Mother    Migraines Mother    Diabetes type II Mother    Lung cancer Father    Hypertension Sister    Heart attack Maternal Grandmother    Heart attack Maternal Grandfather    Stroke Maternal Grandfather     Social History   Socioeconomic History   Marital status: Married    Spouse name: Jenaye Galliher   Number of children: 2   Years of education: 14   Highest education level: Associate degree: occupational, Scientist, product/process development, or vocational program  Occupational History   Occupation: Hair stylist  Tobacco Use   Smoking status: Never   Smokeless tobacco: Never  Vaping Use   Vaping status: Never Used  Substance and Sexual Activity   Alcohol use: Yes    Alcohol/week:  1.0 standard drink of alcohol    Types: 1 Glasses of wine per week   Drug use: Never   Sexual activity: Yes    Partners: Male    Birth control/protection: Surgical  Other Topics Concern   Not on file  Social History Narrative   ** Merged History Encounter **       Right-handedness One-story home   Social Drivers of Health   Financial Resource Strain: Low Risk  (04/06/2023)   Overall Financial Resource Strain (CARDIA)    Difficulty of Paying Living Expenses: Not hard at all  Food Insecurity: No Food Insecurity (04/06/2023)   Hunger Vital  Sign    Worried About Running Out of Food in the Last Year: Never true    Ran Out of Food in the Last Year: Never true  Transportation Needs: No Transportation Needs (04/06/2023)   PRAPARE - Administrator, Civil Service (Medical): No    Lack of Transportation (Non-Medical): No  Physical Activity: Inactive (04/06/2023)   Exercise Vital Sign    Days of Exercise per Week: 0 days    Minutes of Exercise per Session: 0 min  Stress: No Stress Concern Present (04/06/2023)   [employer] of Occupational Health - Occupational Stress Questionnaire    Feeling of Stress : Not at all  Social Connections: Unknown (04/06/2023)   Social Connection and Isolation Panel [NHANES]    Frequency of Communication with Friends and Family: More than three times a week    Frequency of Social Gatherings with Friends and Family: Three times a week    Attends Religious Services: Patient declined    Active Member of Clubs or Organizations: No    Attends Banker Meetings: Never    Marital Status: Married  Catering manager Violence: Not At Risk (04/06/2023)   Humiliation, Afraid, Rape, and Kick questionnaire    Fear of Current or Ex-Partner: No    Emotionally Abused: No    Physically Abused: No    Sexually Abused: No    Outpatient Medications Prior to Visit  Medication Sig Dispense Refill   clonazePAM (KLONOPIN) 0.5 MG tablet TAKE 1 TABLET BY MOUTH EVERYDAY AT BEDTIME 30 tablet 0   escitalopram (LEXAPRO) 20 MG tablet Take 1 tablet (20 mg total) by mouth daily. 90 tablet 0   famotidine (PEPCID) 40 MG tablet Take 40 mg by mouth daily as needed for heartburn or indigestion.     fexofenadine (ALLEGRA) 180 MG tablet Take 180 mg by mouth daily.     gabapentin (NEURONTIN) 300 MG capsule Take 1 capsule (300 mg total) by mouth 2 (two) times daily. 60 capsule 3   polyethylene glycol (MIRALAX / GLYCOLAX) 17 g packet Take 17 g by mouth daily.     rosuvastatin (CRESTOR) 5 MG tablet TAKE 1 TABLET (5  MG TOTAL) BY MOUTH DAILY. 90 tablet 0   SUMAtriptan (IMITREX) 100 MG tablet TAKE 1 TABLET BY MOUTH AT ONSET OF MIGRAINE, REPEAT DOSE IN 2 HRS IF NEEDED, MAX 2 TABS IN 24 HRS 9 tablet 2   SYNTHROID 75 MCG tablet TAKE 1 TABLET BY MOUTH DAILY BEFORE BREAKFAST. 90 tablet 1   topiramate (TOPAMAX) 25 MG tablet Take 1 tablet (25 mg total) by mouth in the morning, at noon, in the evening, and at bedtime. Instead take 1 in the morning and 3 at night 180 tablet 1   No facility-administered medications prior to visit.    No Known Allergies  Review of Systems  Constitutional:  Negative for chills, fatigue and fever.  HENT:  Positive for sore throat. Negative for congestion.   Respiratory:  Negative for cough and shortness of breath.   Cardiovascular:  Negative for chest pain.  Gastrointestinal:  Negative for diarrhea and nausea.       Objective:        08/01/2023    1:50 PM 05/24/2023    9:10 AM 04/06/2023    8:57 AM  Vitals with BMI  Height 5\' 6"  5\' 6"  5\' 6"   Weight 174 lbs 172 lbs 170 lbs  BMI 28.1 27.77 27.45  Systolic 120 109 644  Diastolic 72 64 72  Pulse 76 70 60    No data found.   Physical Exam Vitals reviewed.  Constitutional:      Appearance: Normal appearance.  HENT:     Mouth/Throat:   Neurological:     Mental Status: She is alert.     Health Maintenance Due  Topic Date Due   Zoster Vaccines- Shingrix (1 of 2) Never done    There are no preventive care reminders to display for this patient.   Lab Results  Component Value Date   TSH 0.874 04/06/2023   Lab Results  Component Value Date   WBC 7.1 04/06/2023   HGB 12.7 04/06/2023   HCT 39.0 04/06/2023   MCV 96 04/06/2023   PLT 255 04/06/2023   Lab Results  Component Value Date   NA 142 04/06/2023   K 4.6 04/06/2023   CO2 23 04/06/2023   GLUCOSE 92 04/06/2023   BUN 14 04/06/2023   CREATININE 0.64 04/06/2023   BILITOT 0.3 04/06/2023   ALKPHOS 66 04/06/2023   AST 19 04/06/2023   ALT 15  04/06/2023   PROT 6.5 04/06/2023   ALBUMIN 4.2 04/06/2023   CALCIUM 9.1 04/06/2023   EGFR 104 04/06/2023   Lab Results  Component Value Date   CHOL 177 04/06/2023   Lab Results  Component Value Date   HDL 66 04/06/2023   Lab Results  Component Value Date   LDLCALC 97 04/06/2023   Lab Results  Component Value Date   TRIG 77 04/06/2023   Lab Results  Component Value Date   CHOLHDL 2.7 04/06/2023   Lab Results  Component Value Date   HGBA1C 5.8 (H) 04/06/2023       Assessment & Plan:  Neuropathic pain Assessment & Plan:  Assessment & Plan Oral Pain - no open ulcers.  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 Pain is causing increased anxiety and decreased oral intake. -Increase Gabapentin to 600mg  twice daily for neuropathic pain. -Prescribe viscous lidocaine for temporary relief of oral pain.  Anxiety Increased anxiety due to oral pain. Currently managed with Klonopin. -Continue Klonopin as needed for anxiety.  Follow-up with primary care provider in February.   Other orders -     Lidocaine Viscous HCl; Gargle and swish 15 ml every 3 hours as needed for burning mouth.  Dispense: 200 mL; Refill: 0     Meds ordered this encounter  Medications   lidocaine (XYLOCAINE) 2 % solution    Sig: Gargle and swish 15 ml every 3 hours as needed for burning mouth.    Dispense:  200 mL    Refill:  0    No orders of the defined types were placed in this encounter.     Follow-up: No follow-ups on file.  An After Visit Summary was printed and given to the patient.  Blane Ohara, MD [facility] (  [phone] 

## 2023-08-01 NOTE — Patient Instructions (Addendum)
Increase gabapentin 300 mg 2 capsules twice daily.

## 2023-08-23 ENCOUNTER — Encounter: Payer: 59 | Admitting: Physical Medicine and Rehabilitation

## 2023-08-23 LAB — HM MAMMOGRAPHY

## 2023-09-05 ENCOUNTER — Other Ambulatory Visit: Payer: Self-pay | Admitting: Physician Assistant

## 2023-09-15 ENCOUNTER — Other Ambulatory Visit: Payer: Self-pay | Admitting: Physician Assistant

## 2023-09-27 ENCOUNTER — Encounter: Payer: 59 | Attending: Physical Medicine and Rehabilitation | Admitting: Physical Medicine and Rehabilitation

## 2023-09-27 VITALS — BP 108/73 | HR 74 | Ht 66.0 in | Wt 183.0 lb

## 2023-09-27 DIAGNOSIS — B029 Zoster without complications: Secondary | ICD-10-CM | POA: Insufficient documentation

## 2023-09-27 DIAGNOSIS — E663 Overweight: Secondary | ICD-10-CM | POA: Diagnosis not present

## 2023-09-27 DIAGNOSIS — M792 Neuralgia and neuritis, unspecified: Secondary | ICD-10-CM | POA: Insufficient documentation

## 2023-09-27 NOTE — Patient Instructions (Signed)
Foods that help with pain:  1) Ginger (especially studied for arthritis)- reduce leukotriene production to decrease inflammation 2) Blueberries- high in phytonutrients that decrease inflammation 3) Salmon- marine omega-3s reduce joint swelling and pain 4) Pumpkin seeds- reduce inflammation 5) dark chocolate- reduces inflammation 6) turmeric- reduces inflammation 7) tart cherries - reduce pain and stiffness 8) extra virgin olive oil - its compound olecanthal helps to block prostaglandins  9) chili peppers- can be eaten or applied topically via capsaicin 10) mint- helpful for headache, muscle aches, joint pain, and itching 11) garlic- reduces inflammation  Link to further information on diet for chronic pain: [email or web address]

## 2023-09-27 NOTE — Progress Notes (Signed)
Subjective:    Patient ID: [name], female    DOB: 02/07/1966, 58 y.o.   MRN: [id number]  HPI [name] is a 58 year year old woman presenting for follow-up of peripheral neuropathy.   1) Peripheral neuropathy -pain has been stable -tried topamax last visit -she has not seen any negative side effects to the topamax -had an allergic reaction to Qutenza -pain has been stable -at some times it is worse than others.  -metformin helps -has been rough month with the feet burning and aching -she has been rubbing lotion on the feet- blue emu oil, has been mixing with lidocaine -started back hurting in her toes and ankles -has an appointment with Dr. Blain Pais neurology as it is moving into her fingers -has been present for a year -she is reading my pain journal book and she loves blueberries and cherries -she has never been diagnosed with diabetes -pain is present in forefoot and top of toes -the Qutenza hurt very badly last time so does not want to repeat -toes are constantly tingling -she feels sharp pains like knives -she has tried the TENS unit- this does help ease it. -she takes an extra gabapentin at night if the pain is really bad  2) Overweight -she has tried Weight Watchers and lost 21 bs but this did not help her pain Current BMI is 28.25 and weight is 169 -has lost weight due to the topamax  3) Plantar fasciitis: -she was diagnosed with plantar fasciitis and this was treated.   4) Prediabetes -discussed HgbA1c with her -her PCP was unhappy that she was started on metformin since her hemoglobin A1c  5) Insomnia:  -currently topamax BID helping -her sleep fluctuates  6) Migraines: -worse with the weather -has never had food allergy blood test  7) Fall: -had a hard fall Pain Inventory Average Pain 5 Pain Right Now 5 My pain is intermittent, sharp, burning, stabbing, tingling, and aching  In the last 24 hours, has pain interfered with the  following? General activity 10 Relation with others 10 Enjoyment of life 10 What TIME of day is your pain at its worst? morning , daytime, evening, and night Sleep (in general) Fair  Pain is worse with: walking, sitting, inactivity, and standing Pain improves with: rest and medication Relief from Meds: 3     Family History  Problem Relation Age of Onset   Hypertension Mother    Hyperlipidemia Mother    Breast cancer Mother    Melanoma Mother    Migraines Mother    Diabetes type II Mother    Lung cancer Father    Hypertension Sister    Heart attack Maternal Grandmother    Heart attack Maternal Grandfather    Stroke Maternal Grandfather    Social History   Socioeconomic History   Marital status: Married    Spouse name: Marthe Dant   Number of children: 2   Years of education: 14   Highest education level: Associate degree: occupational, Scientist, product/process development, or vocational program  Occupational History   Occupation: Hair stylist  Tobacco Use   Smoking status: Never   Smokeless tobacco: Never  Vaping Use   Vaping status: Never Used  Substance and Sexual Activity   Alcohol use: Yes    Alcohol/week: 1.0 standard drink of alcohol    Types: 1 Glasses of wine per week   Drug use: Never   Sexual activity: Yes    Partners: Male    Birth control/protection: Surgical  Other Topics Concern   Not on file  Social History Narrative   ** Merged History Encounter **       Right-handedness One-story home   Social Drivers of Health   Financial Resource Strain: Low Risk  (04/06/2023)   Overall Financial Resource Strain (CARDIA)    Difficulty of Paying Living Expenses: Not hard at all  Food Insecurity: No Food Insecurity (04/06/2023)   Hunger Vital Sign    Worried About Running Out of Food in the Last Year: Never true    Ran Out of Food in the Last Year: Never true  Transportation Needs: No Transportation Needs (04/06/2023)   PRAPARE - Administrator, Civil Service  (Medical): No    Lack of Transportation (Non-Medical): No  Physical Activity: Inactive (04/06/2023)   Exercise Vital Sign    Days of Exercise per Week: 0 days    Minutes of Exercise per Session: 0 min  Stress: No Stress Concern Present (04/06/2023)   [employer] of Occupational Health - Occupational Stress Questionnaire    Feeling of Stress : Not at all  Social Connections: Unknown (04/06/2023)   Social Connection and Isolation Panel [NHANES]    Frequency of Communication with Friends and Family: More than three times a week    Frequency of Social Gatherings with Friends and Family: Three times a week    Attends Religious Services: Patient declined    Active Member of Clubs or Organizations: No    Attends Banker Meetings: Never    Marital Status: Married   Past Surgical History:  Procedure Laterality Date   Sander Radon sure ablation  2012   rotator cuff surgery  12/2014   TONSILLECTOMY     as a child   Past Medical History:  Diagnosis Date   Atrophy of thyroid (acquired)    Autoimmune thyroiditis    Chronic fatigue, unspecified    Gastro-esophageal reflux disease without esophagitis    Generalized anxiety disorder    Menopausal and female climacteric states    Migraine without aura, not intractable, without status migrainosus    Peripheral vascular disease (HCC) 1992   blood clot with broken leg, coumadin x 6 mos   Vitamin D deficiency, unspecified    Ht 5\' 6"  (1.676 m)   Wt 183 lb (83 kg)   BMI 29.54 kg/m   Opioid Risk Score:   Fall Risk Score:  `1  Depression screen [facility] 2/9     04/06/2023    9:03 AM 02/21/2023    9:30 AM 11/01/2022   11:08 AM 07/15/2022    9:23 AM 10/22/2021    9:23 AM 09/29/2021    8:40 AM 03/24/2021    8:17 AM  Depression screen PHQ 2/9  Decreased Interest 0 0 0 0 0 0 2  Down, Depressed, Hopeless 0 0 0 0 1 0 0  PHQ - 2 Score 0 0 0 0 1 0 2  Altered sleeping 2    0  3  Tired, decreased energy 2    3  3   Change in appetite 2    0  1   Feeling bad or failure about yourself  0    0  3  Trouble concentrating 2    2  1   Moving slowly or fidgety/restless 0    0  0  Suicidal thoughts 0    0  0  PHQ-9 Score 8    6  13   Difficult doing work/chores Not difficult at all  Somewhat difficult  Not difficult at all     Review of Systems  Gastrointestinal:  Positive for constipation.  Musculoskeletal:  Positive for gait problem.       LT foot pain spasms  Neurological:  Positive for weakness and numbness.  Psychiatric/Behavioral:  Positive for confusion and dysphoric mood. The patient is nervous/anxious.   All other systems reviewed and are negative.      Objective:   Physical Exam Gen: no distress, normal appearing HEENT: oral mucosa pink and moist, NCAT Cardio: Reg rate Chest: normal effort, normal rate of breathing Abd: soft, non-distended Ext: no edema Psych: pleasant, normal affect Skin: intact Neuro: Alert and oriented x3    Assessment & Plan:  1) Peripheral neuropathy -will defer Qutenza -will defer metformin as her other physician does not want her on this -hgbA1c reviewed and is 5.8, due later this month for a recheck -discussed that she tried berberine -f/u with neurology -change topamax to 2 tabs in the morning and 2 at night -discussed that pain is worst at night -Provided with a pain relief journal and discussed that it contains foods and lifestyle tips to naturally help to improve pain. Discussed that these lifestyle strategies are also very good for health unlike some medications which can have negative side effects. Discussed that the act of keeping a journal can be therapeutic and helpful to realize patterns what helps to trigger and alleviate pain.   -Discussed current symptoms of pain and history of pain.  -Discussed benefits of exercise in reducing pain. -Discussed following foods that may reduce pain: 1) Ginger (especially studied for arthritis)- reduce leukotriene production to decrease  inflammation 2) Blueberries- high in phytonutrients that decrease inflammation 3) Salmon- marine omega-3s reduce joint swelling and pain 4) Pumpkin seeds- reduce inflammation 5) dark chocolate- reduces inflammation 6) turmeric- reduces inflammation 7) tart cherries - reduce pain and stiffness 8) extra virgin olive oil - its compound olecanthal helps to block prostaglandins  9) chili peppers- can be eaten or applied topically via capsaicin 10) mint- helpful for headache, muscle aches, joint pain, and itching 11) garlic- reduces inflammation  Link to further information on diet for chronic pain: [email or web address]   2) Overweight: -Discussed the benefits of intermittent fasting. Recommended starting with pushing dinner 15 minutes earlier and when this feels easy, continuing to push dinner 15 minutes earlier. Discussed that this can help her body to improve its ability to burn fat rather than glucose, improving insulin sensitivity. Recommended drinking Roobois tea in the evening to help curb appetite and for its numerous health benefits.   Commended on 9 lb weight loss! Discussed current weight is 165 lbs.  -prescribed topamax, increase to TID -discussed that weight has been stable -discussed that the weight watchers clinic was very helpful but the online app is not as helpful. -discussed her recent weight gain -discussed that she has not been able to exercise  3) Prediabetes: -d/c metformin  -discussed berberine 500mg  TID -repeat HgbA1c reviewed and shows elevation to 5.8.  -discussed discussing with endocrinologists -Discussed the benefits of intermittent fasting. Recommended starting with pushing dinner 15 minutes earlier and when this feels easy, continuing to push dinner 15 minutes earlier. Discussed that this can help her body to improve its ability to burn fat rather than glucose, improving insulin sensitivity.  Recommended drinking Roobois tea in the evening to help curb appetite and for its numerous health benefits.   -discussed benefits of continuous blood glucose monitoring.   4) Fall -discussed that  recent fall was due to balance issue  5) Impaired balance: -discussed that the gabapentin and topamax can worsen the balance  6) Shingles in mouth: -discussed that this occurred after a dental procedure -discussed that she had a root canal and it didn't take and she needed her tooth pulled and an implant placed -discussed that the titanium implant she felt irritated her -discussed that she developed shingles in her tongue -discussed that her gabapentin was increased and she was prescribed mouthwash -discussed that it took weeks to heal -discussed that it was a big blow to her to have the tooth removed  7) Hypothyroidism: -continue Synthroid

## 2023-09-29 ENCOUNTER — Ambulatory Visit (INDEPENDENT_AMBULATORY_CARE_PROVIDER_SITE_OTHER)
Admission: RE | Admit: 2023-09-29 | Discharge: 2023-09-29 | Disposition: A | Discharge status: Home / Self Care | Payer: 59 | Source: Ambulatory Visit | Attending: Physician Assistant | Admitting: Physician Assistant

## 2023-09-29 ENCOUNTER — Encounter: Payer: Self-pay | Admitting: Physician Assistant

## 2023-09-29 ENCOUNTER — Other Ambulatory Visit: Payer: Self-pay | Admitting: Physician Assistant

## 2023-09-29 ENCOUNTER — Ambulatory Visit: Payer: 59 | Admitting: Physician Assistant

## 2023-09-29 VITALS — BP 110/62 | HR 67 | Temp 97.6°F | Resp 16 | Ht 66.0 in | Wt 181.4 lb

## 2023-09-29 DIAGNOSIS — R6889 Other general symptoms and signs: Secondary | ICD-10-CM | POA: Diagnosis not present

## 2023-09-29 DIAGNOSIS — R051 Acute cough: Secondary | ICD-10-CM

## 2023-09-29 DIAGNOSIS — Z86711 Personal history of pulmonary embolism: Secondary | ICD-10-CM | POA: Diagnosis not present

## 2023-09-29 DIAGNOSIS — Z1152 Encounter for screening for COVID-19: Secondary | ICD-10-CM | POA: Diagnosis not present

## 2023-09-29 DIAGNOSIS — E782 Mixed hyperlipidemia: Secondary | ICD-10-CM

## 2023-09-29 LAB — POCT INFLUENZA A/B
Influenza A, POC: NEGATIVE
Influenza B, POC: NEGATIVE

## 2023-09-29 LAB — POC COVID19 BINAXNOW: SARS Coronavirus 2 Ag: NEGATIVE

## 2023-09-29 MED ORDER — ALBUTEROL SULFATE HFA 108 (90 BASE) MCG/ACT IN AERS: 2.0000 | INHALATION_SPRAY | Freq: Four times a day (QID) | RESPIRATORY_TRACT | 2 refills | Status: AC | PRN

## 2023-09-29 MED ORDER — DOXYCYCLINE MONOHYDRATE 100 MG PO CAPS: 100.0000 mg | ORAL_CAPSULE | Freq: Two times a day (BID) | ORAL | 0 refills | Status: DC

## 2023-09-29 NOTE — Patient Instructions (Signed)
VISIT SUMMARY:  You came in today with a chronic cough, fever, and congestion that has been ongoing for about two weeks. You also reported sharp chest pain that worsens with deep breaths. Given your history of pneumonia and blood clots, we have taken these symptoms seriously and have developed a plan to address them.  YOUR PLAN:  -SUSPECTED PNEUMONIA: Pneumonia is an infection that inflames the air sacs in one or both lungs, which may fill with fluid. We have ordered a chest x-ray at [facility] to confirm the diagnosis and started you on Doxycycline for 10 days. If your shortness of breath worsens significantly, your sharp chest pain becomes constant, or you feel like you might pass out, please go to the ER immediately.  -HISTORY OF PULMONARY EMBOLISM: A pulmonary embolism is a blockage in one of the pulmonary arteries in your lungs, usually caused by blood clots that travel to the lungs from the legs. Although you have no current symptoms, we will monitor for any signs of blood clots.  -ASTHMA: Asthma is a condition in which your airways narrow and swell and may produce extra mucus, making breathing difficult. Your asthma is seasonal and can be exacerbated by smoke. We will consider providing you with an Albuterol inhaler if we do not have a sample available in the office.  -GENERAL HEALTH MAINTENANCE: To maintain your overall health, it is important to stay active and perform pulmonary rehab exercises such as blowing up a balloon.  INSTRUCTIONS:  Please follow up with the chest x-ray at [facility] as soon as possible. Take Doxycycline as prescribed for 10 days. Monitor for any symptoms of blood clots and go to the ER if your shortness of breath worsens significantly, your sharp chest pain becomes constant, or you feel like you might pass out. Consider using an Albuterol inhaler if provided. Stay active and perform pulmonary rehab exercises.

## 2023-09-29 NOTE — Progress Notes (Signed)
Subjective:  Patient ID: [name], female    DOB: [birth year]  Age: 58 y.o. MRN: [id number]  Chief Complaint  Patient presents with   Cough   Fatigue   Headache    Cough Associated symptoms include ear pain, a fever, headaches, shortness of breath and wheezing.  Headache  Associated symptoms include back pain, coughing, dizziness, ear pain and a fever.    Discussed the use of AI scribe software for clinical note transcription with the patient, who gave verbal consent to proceed.  History of Present Illness   The patient, with a history of seasonal asthma, blood clots, and previous pneumonia, presents with a chronic cough, fever, and congestion that has been ongoing for approximately two weeks. Initially, she believed the symptoms were due to a cold contracted from her daughter, but the symptoms have persisted and progressed even after the resolution of the cold. The patient reports minimal productive cough and worsening of symptoms at night. She denies any history of smoking or lung disease.  The patient also reports a sharp chest pain that worsens with deep breaths, which she likens to previous experiences with pneumonia. She denies any recent falls or wounds on the legs, but does report a history of blood clots, one of which had migrated to the lung in the past. She is not currently on any blood thinners and reports no current leg pain or swelling. She also mentions having neuropathy.           09/27/2023   11:27 AM 04/06/2023    9:03 AM 02/21/2023    9:30 AM 11/01/2022   11:08 AM 07/15/2022    9:23 AM  Depression screen PHQ 2/9  Decreased Interest 0 0 0 0 0  Down, Depressed, Hopeless 0 0 0 0 0  PHQ - 2 Score 0 0 0 0 0  Altered sleeping  2     Tired, decreased energy  2     Change in appetite  2     Feeling bad or failure about yourself   0     Trouble concentrating  2     Moving slowly or fidgety/restless  0     Suicidal thoughts  0     PHQ-9 Score  8     Difficult  doing work/chores  Not difficult at all           09/27/2023   11:27 AM  Fall Risk   Falls in the past year? 0    Patient Care Team: Marianne Sofia, Cordelia Poche as PCP - General (Physician Assistant) Carlus Pavlov, MD as Consulting Physician (Internal Medicine)   Review of Systems  Constitutional:  Positive for fatigue and fever.  HENT:  Positive for ear pain.   Respiratory:  Positive for cough, chest tightness, shortness of breath and wheezing.   Cardiovascular: Negative.   Gastrointestinal: Negative.   Endocrine: Negative.   Genitourinary: Negative.   Musculoskeletal:  Positive for back pain.  Skin: Negative.   Allergic/Immunologic: Negative.   Neurological:  Positive for dizziness and headaches.  Hematological: Negative.   Psychiatric/Behavioral: Negative.      Current Outpatient Medications on File Prior to Visit  Medication Sig Dispense Refill   clonazePAM (KLONOPIN) 0.5 MG tablet TAKE 1 TABLET BY MOUTH EVERYDAY AT BEDTIME 30 tablet 0   escitalopram (LEXAPRO) 20 MG tablet TAKE 1 TABLET BY MOUTH EVERY DAY 90 tablet 1   famotidine (PEPCID) 40 MG tablet Take 40 mg by mouth daily as needed for  heartburn or indigestion.     fexofenadine (ALLEGRA) 180 MG tablet Take 180 mg by mouth daily.     gabapentin (NEURONTIN) 300 MG capsule Take 1 capsule (300 mg total) by mouth 2 (two) times daily. 60 capsule 3   lidocaine (XYLOCAINE) 2 % solution Gargle and swish 15 ml every 3 hours as needed for burning mouth. 200 mL 0   polyethylene glycol (MIRALAX / GLYCOLAX) 17 g packet Take 17 g by mouth daily.     rosuvastatin (CRESTOR) 5 MG tablet TAKE 1 TABLET (5 MG TOTAL) BY MOUTH DAILY. 90 tablet 0   SUMAtriptan (IMITREX) 100 MG tablet TAKE 1 TABLET BY MOUTH AT ONSET OF MIGRAINE, REPEAT DOSE IN 2 HRS IF NEEDED, MAX 2 TABS IN 24 HRS 9 tablet 2   SYNTHROID 75 MCG tablet TAKE 1 TABLET BY MOUTH DAILY BEFORE BREAKFAST. 90 tablet 1   topiramate (TOPAMAX) 25 MG tablet Take 1 tablet (25 mg total) by mouth  in the morning, at noon, in the evening, and at bedtime. Instead take 1 in the morning and 3 at night 180 tablet 1   No current facility-administered medications on file prior to visit.   Past Medical History:  Diagnosis Date   Atrophy of thyroid (acquired)    Autoimmune thyroiditis    Chronic fatigue, unspecified    Gastro-esophageal reflux disease without esophagitis    Generalized anxiety disorder    Menopausal and female climacteric states    Migraine without aura, not intractable, without status migrainosus    Peripheral vascular disease (HCC) 1992   blood clot with broken leg, coumadin x 6 mos   Vitamin D deficiency, unspecified    Past Surgical History:  Procedure Laterality Date   Nova sure ablation  2012   rotator cuff surgery  12/2014   TONSILLECTOMY     as a child    Family History  Problem Relation Age of Onset   Hypertension Mother    Hyperlipidemia Mother    Breast cancer Mother    Melanoma Mother    Migraines Mother    Diabetes type II Mother    Lung cancer Father    Hypertension Sister    Heart attack Maternal Grandmother    Heart attack Maternal Grandfather    Stroke Maternal Grandfather    Social History   Socioeconomic History   Marital status: Married    Spouse name: Amrita Radu   Number of children: 2   Years of education: 14   Highest education level: Associate degree: occupational, Scientist, product/process development, or vocational program  Occupational History   Occupation: Hair stylist  Tobacco Use   Smoking status: Never   Smokeless tobacco: Never  Vaping Use   Vaping status: Never Used  Substance and Sexual Activity   Alcohol use: Yes    Alcohol/week: 1.0 standard drink of alcohol    Types: 1 Glasses of wine per week   Drug use: Never   Sexual activity: Yes    Partners: Male    Birth control/protection: Surgical  Other Topics Concern   Not on file  Social History Narrative   ** Merged History Encounter **       Right-handedness One-story home    Social Drivers of Health   Financial Resource Strain: Low Risk  (04/06/2023)   Overall Financial Resource Strain (CARDIA)    Difficulty of Paying Living Expenses: Not hard at all  Food Insecurity: No Food Insecurity (04/06/2023)   Hunger Vital Sign    Worried About Running  Out of Food in the Last Year: Never true    Ran Out of Food in the Last Year: Never true  Transportation Needs: No Transportation Needs (04/06/2023)   PRAPARE - Administrator, Civil Service (Medical): No    Lack of Transportation (Non-Medical): No  Physical Activity: Inactive (04/06/2023)   Exercise Vital Sign    Days of Exercise per Week: 0 days    Minutes of Exercise per Session: 0 min  Stress: No Stress Concern Present (04/06/2023)   [employer] of Occupational Health - Occupational Stress Questionnaire    Feeling of Stress : Not at all  Social Connections: Unknown (04/06/2023)   Social Connection and Isolation Panel [NHANES]    Frequency of Communication with Friends and Family: More than three times a week    Frequency of Social Gatherings with Friends and Family: Three times a week    Attends Religious Services: Patient declined    Active Member of Clubs or Organizations: No    Attends Banker Meetings: Never    Marital Status: Married    Objective:  BP 110/62 (BP Location: Right Arm, Patient Position: Sitting, Cuff Size: Normal)   Pulse 67   Temp 97.6 F (36.4 C) (Temporal)   Resp 16   Ht 5\' 6"  (1.676 m)   Wt 181 lb 6.4 oz (82.3 kg)   SpO2 100%   BMI 29.28 kg/m      09/29/2023    1:14 PM 09/27/2023   11:26 AM 08/01/2023    1:50 PM  BP/Weight  Systolic BP 110 108 120  Diastolic BP 62 73 72  Wt. (Lbs) 181.4 183 174  BMI 29.28 kg/m2 29.54 kg/m2 28.08 kg/m2    Physical Exam Vitals reviewed.  Constitutional:      Appearance: Normal appearance.  Neck:     Vascular: No carotid bruit.  Cardiovascular:     Rate and Rhythm: Normal rate and regular rhythm.      Heart sounds: Normal heart sounds.  Pulmonary:     Effort: Pulmonary effort is normal.     Breath sounds: Examination of the left-lower field reveals decreased breath sounds. Decreased breath sounds and wheezing present.  Abdominal:     General: Bowel sounds are normal.     Palpations: Abdomen is soft.     Tenderness: There is no abdominal tenderness.  Neurological:     Mental Status: She is alert and oriented to person, place, and time.  Psychiatric:        Mood and Affect: Mood normal.        Behavior: Behavior normal.     Diabetic Foot Exam - Simple   No data filed      Lab Results  Component Value Date   WBC 7.1 04/06/2023   HGB 12.7 04/06/2023   HCT 39.0 04/06/2023   PLT 255 04/06/2023   GLUCOSE 92 04/06/2023   CHOL 177 04/06/2023   TRIG 77 04/06/2023   HDL 66 04/06/2023   LDLCALC 97 04/06/2023   ALT 15 04/06/2023   AST 19 04/06/2023   NA 142 04/06/2023   K 4.6 04/06/2023   CL 108 (H) 04/06/2023   CREATININE 0.64 04/06/2023   BUN 14 04/06/2023   CO2 23 04/06/2023   TSH 0.874 04/06/2023   HGBA1C 5.8 (H) 04/06/2023      Assessment & Plan:    Acute cough Assessment & Plan: Chronic cough, fever, and congestion for two weeks. History of walking pneumonia. Sharp chest pain  worsens with deep breaths. Lung sounds suggest possible fluid. Negative for flu and COVID-19. -Order chest x-ray at [facility]. -Start Doxycycline for 10 days. -Advise patient to go to ER if shortness of breath worsens significantly, sharp chest pain becomes constant, or if feeling of passing out due to lack of air.  Orders: -     DG Chest 2 View; Future -     Doxycycline Monohydrate; Take 1 capsule (100 mg total) by mouth 2 (two) times daily.  Dispense: 20 capsule; Refill: 0 -     Albuterol Sulfate HFA; Inhale 2 puffs into the lungs every 6 (six) hours as needed for wheezing or shortness of breath.  Dispense: 8 g; Refill: 2 -     D-dimer, quantitative; Future  History of pulmonary  embolism Assessment & Plan: No current symptoms of blood clots in legs. No current use of blood thinners. -Monitor for any symptoms worsening SOB or chest pain -If x-ray negative will order STAT d-dimer due to over 2 years since last PE event. -No recent trauma, surgery, or abnormal leg pain   Flu-like symptoms Assessment & Plan: Both negative Will begin proactive treatment for bronchitis/pneumonia Will adjust treatment depending on x-ray results  Orders: -     POCT Influenza A/B -     POC COVID-19 BinaxNow  Encounter for screening for COVID-19 -     POC COVID-19 BinaxNow     Meds ordered this encounter  Medications   doxycycline (MONODOX) 100 MG capsule    Sig: Take 1 capsule (100 mg total) by mouth 2 (two) times daily.    Dispense:  20 capsule    Refill:  0   albuterol (VENTOLIN HFA) 108 (90 Base) MCG/ACT inhaler    Sig: Inhale 2 puffs into the lungs every 6 (six) hours as needed for wheezing or shortness of breath.    Dispense:  8 g    Refill:  2    Orders Placed This Encounter  Procedures   DG Chest 2 View   D-dimer, quantitative   POCT Influenza A/B   POC COVID-19      Follow-up: Return if symptoms worsen or fail to improve.   I,Angela Taylor,acting as a Neurosurgeon for [employer], PA.,have documented all relevant documentation on the behalf of Langley Gauss, PA,as directed by  Langley Gauss, PA while in the presence of Langley Gauss, [place].   An After Visit Summary was printed and given to the patient.  Langley Gauss, [place] [facility] [phone]

## 2023-09-30 ENCOUNTER — Other Ambulatory Visit: Payer: 59

## 2023-09-30 DIAGNOSIS — R051 Acute cough: Secondary | ICD-10-CM

## 2023-09-30 LAB — D-DIMER, QUANTITATIVE: D-DIMER: 2.02 mg{FEU}/L — ABNORMAL HIGH (ref 0.00–0.49)

## 2023-10-02 ENCOUNTER — Telehealth: Payer: Self-pay | Admitting: Physician Assistant

## 2023-10-02 ENCOUNTER — Other Ambulatory Visit: Payer: Self-pay | Admitting: Physician Assistant

## 2023-10-02 ENCOUNTER — Encounter: Payer: Self-pay | Admitting: Physician Assistant

## 2023-10-02 DIAGNOSIS — R7989 Other specified abnormal findings of blood chemistry: Secondary | ICD-10-CM

## 2023-10-02 MED ORDER — APIXABAN 5 MG PO TABS: ORAL_TABLET | ORAL | 3 refills | Status: DC

## 2023-10-02 NOTE — Telephone Encounter (Signed)
D-Dimer has come back positive. Called and discussed results with patient. Patient states her symptoms have not worsened and she does not feel critical at this time. Discussed with her the need for emergent imaging to make sure she does not have a pulmonary embolism. Patient stated she would rather wait till the morning so she can have her parents watch her daughter. She was able to see the result Friday afternoon and has since not had any worsening symptoms. Emphasized the importance of ruling out PE and would encourage her to consider going tonight to be evaluated. Patient refused tonight but agreed to go for CTA or  in the morning. Will go tomorrow morning to pick up Eliquis from pharmacy. Will also place order in the morning for STAT CTA of the chest to rule out PE. Patient agreed to the plan and agreed that if she has any change in symptoms she will go to [facility].

## 2023-10-03 ENCOUNTER — Other Ambulatory Visit: Payer: Self-pay

## 2023-10-03 ENCOUNTER — Emergency Department (HOSPITAL_BASED_OUTPATIENT_CLINIC_OR_DEPARTMENT_OTHER): Payer: 59

## 2023-10-03 ENCOUNTER — Other Ambulatory Visit (HOSPITAL_BASED_OUTPATIENT_CLINIC_OR_DEPARTMENT_OTHER): Payer: 59 | Admitting: Radiology

## 2023-10-03 ENCOUNTER — Encounter (HOSPITAL_BASED_OUTPATIENT_CLINIC_OR_DEPARTMENT_OTHER): Payer: Self-pay | Admitting: Emergency Medicine

## 2023-10-03 ENCOUNTER — Encounter: Payer: Self-pay | Admitting: Physician Assistant

## 2023-10-03 ENCOUNTER — Emergency Department (HOSPITAL_BASED_OUTPATIENT_CLINIC_OR_DEPARTMENT_OTHER)
Admission: EM | Admit: 2023-10-03 | Discharge: 2023-10-03 | Disposition: A | Discharge status: Home / Self Care | Payer: 59 | Attending: Emergency Medicine | Admitting: Emergency Medicine

## 2023-10-03 DIAGNOSIS — Z7901 Long term (current) use of anticoagulants: Secondary | ICD-10-CM | POA: Insufficient documentation

## 2023-10-03 DIAGNOSIS — E039 Hypothyroidism, unspecified: Secondary | ICD-10-CM | POA: Diagnosis not present

## 2023-10-03 DIAGNOSIS — Z86711 Personal history of pulmonary embolism: Secondary | ICD-10-CM | POA: Insufficient documentation

## 2023-10-03 DIAGNOSIS — R6889 Other general symptoms and signs: Secondary | ICD-10-CM | POA: Insufficient documentation

## 2023-10-03 DIAGNOSIS — J40 Bronchitis, not specified as acute or chronic: Secondary | ICD-10-CM | POA: Insufficient documentation

## 2023-10-03 DIAGNOSIS — R053 Chronic cough: Secondary | ICD-10-CM | POA: Insufficient documentation

## 2023-10-03 DIAGNOSIS — I281 Aneurysm of pulmonary artery: Secondary | ICD-10-CM | POA: Insufficient documentation

## 2023-10-03 DIAGNOSIS — R0602 Shortness of breath: Secondary | ICD-10-CM

## 2023-10-03 DIAGNOSIS — Z1152 Encounter for screening for COVID-19: Secondary | ICD-10-CM | POA: Insufficient documentation

## 2023-10-03 DIAGNOSIS — I2699 Other pulmonary embolism without acute cor pulmonale: Secondary | ICD-10-CM

## 2023-10-03 DIAGNOSIS — Z79899 Other long term (current) drug therapy: Secondary | ICD-10-CM | POA: Insufficient documentation

## 2023-10-03 LAB — COMPREHENSIVE METABOLIC PANEL
ALT: 17 U/L (ref 0–44)
AST: 18 U/L (ref 15–41)
Albumin: 4.1 g/dL (ref 3.5–5.0)
Alkaline Phosphatase: 58 U/L (ref 38–126)
Anion gap: 7 (ref 5–15)
BUN: 13 mg/dL (ref 6–20)
CO2: 25 mmol/L (ref 22–32)
Calcium: 8.8 mg/dL — ABNORMAL LOW (ref 8.9–10.3)
Chloride: 109 mmol/L (ref 98–111)
Creatinine, Ser: 0.58 mg/dL (ref 0.44–1.00)
GFR, Estimated: >60 mL/min (ref >60)
Glucose, Bld: 85 mg/dL (ref 70–99)
Potassium: 3.5 mmol/L (ref 3.5–5.1)
Sodium: 141 mmol/L (ref 135–145)
Total Bilirubin: 0.6 mg/dL (ref 0.0–1.2)
Total Protein: 6.9 g/dL (ref 6.5–8.1)

## 2023-10-03 LAB — CBC WITH DIFFERENTIAL/PLATELET
Abs Immature Granulocytes: 0.02 10*3/uL (ref 0.00–0.07)
Basophils Absolute: 0 10*3/uL (ref 0.0–0.1)
Basophils Relative: 1 %
Eosinophils Absolute: 0.1 10*3/uL (ref 0.0–0.5)
Eosinophils Relative: 2 %
HCT: 39.7 % (ref 36.0–46.0)
Hemoglobin: 12.9 g/dL (ref 12.0–15.0)
Immature Granulocytes: 0 %
Lymphocytes Relative: 31 %
Lymphs Abs: 2.1 10*3/uL (ref 0.7–4.0)
MCH: 30.4 pg (ref 26.0–34.0)
MCHC: 32.5 g/dL (ref 30.0–36.0)
MCV: 93.4 fL (ref 80.0–100.0)
Monocytes Absolute: 0.7 10*3/uL (ref 0.1–1.0)
Monocytes Relative: 10 %
Neutro Abs: 3.7 10*3/uL (ref 1.7–7.7)
Neutrophils Relative %: 56 %
Platelets: 255 10*3/uL (ref 150–400)
RBC: 4.25 MIL/uL (ref 3.87–5.11)
RDW: 13.1 % (ref 11.5–15.5)
WBC: 6.6 10*3/uL (ref 4.0–10.5)
nRBC: 0 % (ref 0.0–0.2)

## 2023-10-03 LAB — BRAIN NATRIURETIC PEPTIDE: B Natriuretic Peptide: 48.2 pg/mL (ref 0.0–100.0)

## 2023-10-03 LAB — TROPONIN I (HIGH SENSITIVITY): Troponin I (High Sensitivity): <2 ng/L (ref <18)

## 2023-10-03 MED ORDER — IOHEXOL 350 MG/ML SOLN
100.0000 mL | Freq: Once | INTRAVENOUS | Status: AC | PRN
  Administered 2023-10-03: 75 mL via INTRAVENOUS

## 2023-10-03 MED ORDER — PREDNISONE 20 MG PO TABS: 40.0000 mg | ORAL_TABLET | Freq: Every day | ORAL | 0 refills | Status: AC

## 2023-10-03 MED ORDER — IPRATROPIUM-ALBUTEROL 0.5-2.5 (3) MG/3ML IN SOLN: 3.0000 mL | RESPIRATORY_TRACT | 0 refills | Status: AC | PRN

## 2023-10-03 MED ORDER — IPRATROPIUM-ALBUTEROL 0.5-2.5 (3) MG/3ML IN SOLN
3.0000 mL | Freq: Once | RESPIRATORY_TRACT | Status: AC
  Administered 2023-10-03: 3 mL via RESPIRATORY_TRACT
  Filled 2023-10-03: qty 3

## 2023-10-03 NOTE — ED Triage Notes (Signed)
Chest pain and shortness of breath with cough and Headache 2 weeks ago , had labs and xray done 3 days ago with positive D dimer , sent here to rule out PE .

## 2023-10-03 NOTE — ED Notes (Signed)
Pt. Saw her Dr. On Magdal[facility] Molly. Last week and said her blood work came back and she was told to come to the ED for poss. Blood clot due to her history of Coughing.  Also the c/o hurting in her back.

## 2023-10-03 NOTE — ED Provider Notes (Signed)
EMERGENCY DEPARTMENT AT [facility] Provider Note   CSN: [id number] Arrival date & time: 10/03/23  1037     History Chief Complaint  Patient presents with   Shortness of Breath    [name] is a 58 y.o. female with history of migraines, asthma, hypothyroid, hyperlipidemia presents the emerged from today for evaluation of cough for the past 2 weeks.  Reports that her daughter had the flu and she got it from her but has seen 2 of lingered.  She reports that she initially had some chills but no recorded fever and also had runny nose and a congestion that is since gone away.  She reports more of a tension headache however this feels lesser than her typical migraine she has.  She reports a dry cough, no hemoptysis.  She reports that she feels like a wheezing and rattling in her chest.  Reports that she has been having some chest pain and shortness of breath that is worse on exertion.  Denies any abdominal pain, nausea, vomiting, or leg swelling.  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  1 was in 55 and was a provoked PE from OCPs and her leg being in a cast.  She reports that she also another DVT around 7-8 years ago after a varicose procedure.  She is not on chronic anticoagulants.  She was seen by her primary care doctor recently and was given doxycycline and had a D-dimer ordered given her history.  Chest x-ray did not show any signs of pneumonia at that time.  D-dimer was elevated so she was sent to the Emergency Department for evaluation and CTA imaging to rule out PE.  She denies any drug allergies.  Denies any tobacco, EtOH, drug use.  Denies any recent surgery.   Shortness of Breath Associated symptoms: chest pain and cough   Associated symptoms: no abdominal pain, no fever and no vomiting        Home Medications Prior to Admission medications   Medication Sig Start Date End Date Taking? Authorizing Provider  albuterol (VENTOLIN HFA) 108 (90 Base) MCG/ACT  inhaler Inhale 2 puffs into the lungs every 6 (six) hours as needed for wheezing or shortness of breath. 09/29/23   Langley Gauss, PA  apixaban (ELIQUIS) 5 MG TABS tablet Take 2 tablets (10mg ) twice daily for 7 days, then 1 tablet (5mg ) twice daily 10/02/23   Langley Gauss, PA  clonazePAM (KLONOPIN) 0.5 MG tablet TAKE 1 TABLET BY MOUTH EVERYDAY AT BEDTIME 09/15/23   Marianne Sofia, PA-C  doxycycline (MONODOX) 100 MG capsule Take 1 capsule (100 mg total) by mouth 2 (two) times daily. 09/29/23   Langley Gauss, PA  escitalopram (LEXAPRO) 20 MG tablet TAKE 1 TABLET BY MOUTH EVERY DAY 09/05/23   Cox, Fritzi Mandes, MD  famotidine (PEPCID) 40 MG tablet Take 40 mg by mouth daily as needed for heartburn or indigestion.    [provider]  fexofenadine (ALLEGRA) 180 MG tablet Take 180 mg by mouth daily.    [provider]  gabapentin (NEURONTIN) 300 MG capsule Take 1 capsule (300 mg total) by mouth 2 (two) times daily. 05/24/23   Raulkar, Drema Pry, MD  lidocaine (XYLOCAINE) 2 % solution Gargle and swish 15 ml every 3 hours as needed for burning mouth. 08/01/23   Cox, Kirsten, MD  polyethylene glycol (MIRALAX / GLYCOLAX) 17 g packet Take 17 g by mouth daily.    [provider]  rosuvastatin (CRESTOR) 5 MG  tablet TAKE 1 TABLET (5 MG TOTAL) BY MOUTH DAILY. 09/29/23   Marianne Sofia, PA-C  SUMAtriptan (IMITREX) 100 MG tablet TAKE 1 TABLET BY MOUTH AT ONSET OF MIGRAINE, REPEAT DOSE IN 2 HRS IF NEEDED, MAX 2 TABS IN 24 HRS 12/08/20   Marianne Sofia, PA-C  SYNTHROID 75 MCG tablet TAKE 1 TABLET BY MOUTH DAILY BEFORE BREAKFAST. 03/02/23   Carlus Pavlov, MD  topiramate (TOPAMAX) 25 MG tablet Take 1 tablet (25 mg total) by mouth in the morning, at noon, in the evening, and at bedtime. Instead take 1 in the morning and 3 at night 05/24/23   Raulkar, Drema Pry, MD      Allergies    Patient has no known allergies.    Review of Systems   Review of Systems  Constitutional:  Negative for chills and fever.   Respiratory:  Positive for cough and shortness of breath.   Cardiovascular:  Positive for chest pain. Negative for palpitations and leg swelling.  Gastrointestinal:  Negative for abdominal pain, constipation, diarrhea, nausea and vomiting.  Genitourinary:  Negative for dysuria and hematuria.  SEE HPI  Physical Exam Updated Vital Signs BP 122/66   Pulse 78   Temp 97.7 F (36.5 C) (Oral)   Resp (!) 22   Wt 83 kg   SpO2 100%   BMI 29.53 kg/m  Physical Exam Vitals and nursing note reviewed.  Constitutional:      General: She is not in acute distress.    Appearance: She is not ill-appearing or toxic-appearing.  Cardiovascular:     Rate and Rhythm: Normal rate.     Pulses:          Radial pulses are 2+ on the right side and 2+ on the left side.       Dorsalis pedis pulses are 2+ on the right side and 2+ on the left side.       Posterior tibial pulses are 2+ on the right side and 2+ on the left side.  Pulmonary:     Effort: Pulmonary effort is normal. No accessory muscle usage.     Breath sounds: Decreased breath sounds present.     Comments: Slightly diminished at the bilateral lower bases.  No wheezing auscultated.  She speaking in full sentences with ease, satting well on room air without increased work of breathing. Musculoskeletal:     Right lower leg: No tenderness. No edema.     Left lower leg: No tenderness. No edema.  Skin:    General: Skin is warm and dry.  Neurological:     Mental Status: She is alert.     ED Results / Procedures / Treatments   Labs (all labs ordered are listed, but only abnormal results are displayed) Labs Reviewed  COMPREHENSIVE METABOLIC PANEL - Abnormal; Notable for the following components:      Result Value   Calcium 8.8 (*)    All other components within normal limits  CBC WITH DIFFERENTIAL/PLATELET  BRAIN NATRIURETIC PEPTIDE  TROPONIN I (HIGH SENSITIVITY)    EKG EKG Interpretation Date/Time:  Monday October 03 2023 11:00:10  EST Ventricular Rate:  66 PR Interval:  175 QRS Duration:  92 QT Interval:  424 QTC Calculation: 445 R Axis:   57  Text Interpretation: Sinus rhythm Confirmed by Tanda Rockers ([id number]) on 10/03/2023 12:15:51 PM  Radiology CT Angio Chest PE W and/or Wo Contrast Result Date: 10/03/2023 CLINICAL DATA:  Pulmonary embolism (PE) suspected, low to intermediate prob, positive D-dimer. Cough. Shortness  of breath for 2 weeks. EXAM: CT ANGIOGRAPHY CHEST WITH CONTRAST TECHNIQUE: Multidetector CT imaging of the chest was performed using the standard protocol during bolus administration of intravenous contrast. Multiplanar CT image reconstructions and MIPs were obtained to evaluate the vascular anatomy. RADIATION DOSE REDUCTION: This exam was performed according to the departmental dose-optimization program which includes automated exposure control, adjustment of the mA and/or kV according to patient size and/or use of iterative reconstruction technique. CONTRAST:  75mL OMNIPAQUE IOHEXOL 350 MG/ML SOLN COMPARISON:  None Available. FINDINGS: Cardiovascular: No evidence of embolism to the proximal subsegmental pulmonary artery level. There is focal aneurysmal dilation of right lung lower lobe posterior segment, distal segmental pulmonary artery branch near the diaphragmatic surface measuring up to 6 mm in diameter. The aneurysm is not opacified, suggesting thrombosed. This is of unknown clinical significance. Normal cardiac size. No pericardial effusion. No aortic aneurysm. Mediastinum/Nodes: Visualized thyroid gland appears grossly unremarkable. No solid / cystic mediastinal masses. The esophagus is nondistended precluding optimal assessment. No axillary, mediastinal or hilar lymphadenopathy by size criteria. Lungs/Pleura: The central tracheo-bronchial tree is patent. No mass or consolidation. No pleural effusion or pneumothorax. No suspicious lung nodules. Upper Abdomen: Visualized upper abdominal viscera within normal  limits. Musculoskeletal: The visualized soft tissues of the chest wall are grossly unremarkable. No suspicious osseous lesions. Review of the MIP images confirms the above findings. IMPRESSION: 1. No embolism to the proximal subsegmental pulmonary artery level. 2. 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 The aneurysm is not opacified, suggesting thrombosed. This is of unknown clinical significance. 3. No lung mass, consolidation, pleural effusion or pneumothorax. Electronically Signed   By: Jules Schick M.D.   On: 10/03/2023 14:38    Procedures Procedures   Medications Ordered in ED Medications  ipratropium-albuterol (DUONEB) 0.5-2.5 (3) MG/3ML nebulizer solution 3 mL (has no administration in time range)  iohexol (OMNIPAQUE) 350 MG/ML injection 100 mL (75 mLs Intravenous Contrast Given 10/03/23 1157)    ED Course/ Medical Decision Making/ A&P  Medical Decision Making Amount and/or Complexity of Data Reviewed Labs: ordered. Radiology: ordered.  Risk Prescription drug management.   58 y.o. female presents to the ER for evaluation of cough, chest pain, shortness of breath. Differential diagnosis includes but is not limited to CHF, pericardial effusion/tamponade, arrhythmias, ACS, COPD, asthma, bronchitis, pneumonia, pneumothorax, PE, anemia. Vital signs vital signs unremarkable, respirate is marked 22 however this is not accurate.  I was in the room and the patient's between 16 and 18. Physical exam as noted above.   I independently reviewed and interpreted the patient's labs.  CMP shows mildly decreased calcium 8.8 otherwise no other electrolyte or LFT abnormality.  CBC without leukocytosis or anemia.  Troponin less than 2.  BNP within normal limits.  EKG reviewed and interpreted by my attending and read as sinus rhythm.   CT imaging shows 1. No embolism to the proximal subsegmental  pulmonary artery level. 2. 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 The aneurysm is not opacified, suggesting thrombosed. This is of unknown clinical significance. 3. No lung mass, consolidation, pleural effusion or pneumothorax. Per radiologist's interpretation.    I felt the syntax of the read to be a little confusing so I did call the radiologist to confirm.  There is a thrombus in the 6 mm focal aneurysm dilation in  the right lung lower lobe as mentioned above.  Unsure of what the chronicity was.  I did speak to the radiologist and he did add that there is no AV malformation.  Will consult pulmonology.  I spoke with NP with critical care who discussed this case with her attending, Dr. Levon Hedger who reviewed the case and images.  He thinks this is likely chronic remodeling but it is reasonable to send her home on anticoagulants indefinitely.  The patient has had a PE, DVT, as well as another DVT.  She was already given blood thinners given the elevated D-dimer by her primary care provider.  We discussed anticoagulation as well as the interesting CT read.  I would like for her to follow-up with [facility] pulmonology outpatient.  Given that she has had a prolonged dry cough and does feel better with the DuoNebs given here, I will send her home with a DuoNeb ampules per her request as well as some prednisone to help with any bronchitis.  There is no pneumonia seen on her CT imaging.  Additionally, she can follow-up with pulmonology about her prolonged cough.  I did provide her more information on Eliquis however reported that she needed to get her refills from her primary care doctor where she got the original prescription from.  She reports her symptoms of the shortness of breath and chest tightness have improved with the DuoNebs.  With normal troponins and reassuring EKG, this is less likely anything  cardiac in nature.  CT rules out any pneumonia, or pneumothorax.  She could be having some shortness of breath from this aneurysm however I think this is likely more bronchitis in nature.  She stable for discharge home with close outpatient follow-up.  We discussed the results of the labs/imaging. The plan is take medication as prescribed, follow-up with specialist. We discussed strict return precautions and red flag symptoms. The patient verbalized their understanding and agrees to the plan. The patient is stable and being discharged home in good condition.  Portions of this report may have been transcribed using voice recognition software. Every effort was made to ensure accuracy; however, inadvertent computerized transcription errors may be present.   Final Clinical Impression(s) / ED Diagnoses Final diagnoses:  SOB (shortness of breath)  Aneurysm of pulmonary artery (HCC)  Bronchitis    Rx / DC Orders ED Discharge Orders          Ordered    predniSONE (DELTASONE) 20 MG tablet  Daily        10/03/23 1757    ipratropium-albuterol (DUONEB) 0.5-2.5 (3) MG/3ML SOLN  Every 4 hours PRN        10/03/23 1757    Ambulatory referral to Pulmonology        10/03/23 1801              Achille Rich, PA-C 10/03/23 2146    Sloan Leiter, DO 10/05/23 2104

## 2023-10-03 NOTE — ED Notes (Signed)
 Discharge instructions reviewed with patient. Patient verbalizes understanding, no further questions at this time. Medications/prescriptions and follow up information provided. No acute distress noted at time of departure.

## 2023-10-03 NOTE — Progress Notes (Signed)
10/03/2023 Reviewed case. 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  Alternatively, argument can be made that watchful waiting can be pursued given how small the clot burden is IF the patient has a high risk of bleeding.  Myrla Halsted MD PCCM

## 2023-10-03 NOTE — Assessment & Plan Note (Signed)
No current symptoms of blood clots in legs. No current use of blood thinners. -Monitor for any symptoms worsening SOB or chest pain -If x-ray negative will order STAT d-dimer due to over 2 years since last PE event. -No recent trauma, surgery, or abnormal leg pain

## 2023-10-03 NOTE — Assessment & Plan Note (Signed)
Chronic cough, fever, and congestion for two weeks. History of walking pneumonia. Sharp chest pain worsens with deep breaths. Lung sounds suggest possible fluid. Negative for flu and COVID-19. -Order chest x-ray at [facility]. -Start Doxycycline for 10 days. -Advise patient to go to ER if shortness of breath worsens significantly, sharp chest pain becomes constant, or if feeling of passing out due to lack of air.

## 2023-10-03 NOTE — Assessment & Plan Note (Signed)
Both negative Will begin proactive treatment for bronchitis/pneumonia Will adjust treatment depending on x-ray results

## 2023-10-03 NOTE — Discharge Instructions (Addendum)
You were seen here for evaluation of your symptoms.  There was a small pulmonary artery aneurysm with questionable clots seen on CT imaging.  It was suggested that you continue taking the Eliquis as prescribed.  I know that this was given to you by your primary care provider, however I have included more information on Eliquis or otherwise known as apixaban into the disc report for you to review.  Have also included more omission of shortness of breath and acute bronchitis.  I have refilled your DuoNeb solutions for your nebulizer machine.  Have also sent you in the prednisone that we previously discussed.  Remember the risk and warnings he discussed with blood thinner use.  Remember no NSAIDs.  I have put in an ambulatory referral for pulmonology to see you.  If you do not hear back within the next few days, please call them to schedule an appointment.  If you have any concerns, new or worsening symptoms, please return to your nearest emergency department for reevaluation.  Contact a doctor if: Your condition does not get better as soon as expected. You have a hard time doing your normal activities, even after you rest. You have new symptoms. You cannot walk up stairs. You cannot exercise the way you normally do. Get help right away if: Your shortness of breath gets worse. You have trouble breathing when you are resting. You feel light-headed or you faint. You have a cough that is not helped by medicines. You cough up blood. You have pain with breathing. You have pain in your chest, arms, shoulders, or belly (abdomen). You have a fever. These symptoms may be an emergency. Get help right away. Call 911. Do not wait to see if the symptoms will go away. Do not drive yourself to the hospital.

## 2023-10-10 ENCOUNTER — Encounter: Payer: Self-pay | Admitting: Physician Assistant

## 2023-10-11 ENCOUNTER — Encounter: Payer: Self-pay | Admitting: Physician Assistant

## 2023-10-11 ENCOUNTER — Ambulatory Visit: Payer: 59 | Admitting: Physician Assistant

## 2023-10-11 VITALS — BP 118/76 | HR 67 | Temp 97.2°F | Resp 18 | Ht 66.0 in | Wt 182.4 lb

## 2023-10-11 DIAGNOSIS — E559 Vitamin D deficiency, unspecified: Secondary | ICD-10-CM | POA: Diagnosis not present

## 2023-10-11 DIAGNOSIS — F419 Anxiety disorder, unspecified: Secondary | ICD-10-CM

## 2023-10-11 DIAGNOSIS — E039 Hypothyroidism, unspecified: Secondary | ICD-10-CM

## 2023-10-11 DIAGNOSIS — R0789 Other chest pain: Secondary | ICD-10-CM

## 2023-10-11 DIAGNOSIS — R7303 Prediabetes: Secondary | ICD-10-CM | POA: Diagnosis not present

## 2023-10-11 DIAGNOSIS — E782 Mixed hyperlipidemia: Secondary | ICD-10-CM

## 2023-10-11 DIAGNOSIS — Z86711 Personal history of pulmonary embolism: Secondary | ICD-10-CM

## 2023-10-11 NOTE — Progress Notes (Signed)
 Subjective:  Patient ID: [name], female    DOB: [birth year]  Age: 58 y.o. MRN: [id number]  Chief Complaint  Patient presents with   Medical Management of Chronic Issues    Hyperlipidemia  Anxiety      Pt with history of hypothyroidism - currently on synthroid 75 mcg and follows with endocrinology Dr Reino Kent - voices no problems or concerns  Pt with history of anxiety -  currently stable on medication klonopin and lexapro 20mg  - she has not had any breakthrough symptoms recently  Pt with history of vit D deficiency - pt is due for repeat labwork  Pt with history of hyperlipidemia - does watch diet - pt states she is taking crestor 5mg  qd and watching diet - due for labwowrk  Pt is being followed by specialist for chronic foot pain/neuralgia - she is taking gabapentin for pain which is being managed at this time  Pt with migraines - takes topamax and imitrex  Pt was seen at ED recently for uri symptoms and had a positive Ddimer.  Pt does have history of PE in the past .  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 Pulmonologist was consulted and pt was advised to start Eliquis --- she actually has only been taking 5mg  bid for the past 7 days and was going to take only one daily now however I did tell her the initial dose she should have been taking was 10mg  bid for 7 days then dropping to 5mg  bid and for now continue 5mg  bid Pt has a consult with pulmonology this Friday for further evaluation  Pt states that last night she experienced chest heaviness for most of the evening.  She feels this is due to her worrying about her chest.  She states her breathing is doing well and voices no problems with dyspnea.  Pt did have cardiac evaluation in 2023 which was normal.  Has not had follow up since that time Current Outpatient Medications on File Prior to Visit  Medication Sig Dispense Refill   albuterol (VENTOLIN HFA)  108 (90 Base) MCG/ACT inhaler Inhale 2 puffs into the lungs every 6 (six) hours as needed for wheezing or shortness of breath. 8 g 2   apixaban (ELIQUIS) 5 MG TABS tablet Take 2 tablets (10mg ) twice daily for 7 days, then 1 tablet (5mg ) twice daily 60 tablet 3   clonazePAM (KLONOPIN) 0.5 MG tablet TAKE 1 TABLET BY MOUTH EVERYDAY AT BEDTIME 30 tablet 0   escitalopram (LEXAPRO) 20 MG tablet TAKE 1 TABLET BY MOUTH EVERY DAY 90 tablet 1   famotidine (PEPCID) 40 MG tablet Take 40 mg by mouth daily as needed for heartburn or indigestion.     fexofenadine (ALLEGRA) 180 MG tablet Take 180 mg by mouth daily.     gabapentin (NEURONTIN) 300 MG capsule Take 1 capsule (300 mg total) by mouth 2 (two) times daily. 60 capsule 3   ipratropium-albuterol (DUONEB) 0.5-2.5 (3) MG/3ML SOLN Take 3 mLs by nebulization every 4 (four) hours as needed. 360 mL 0   lidocaine (XYLOCAINE) 2 % solution Gargle and swish 15 ml every 3 hours as needed for burning mouth. 200 mL 0   polyethylene glycol (MIRALAX / GLYCOLAX) 17 g packet Take 17 g by mouth daily.     rosuvastatin (CRESTOR) 5 MG tablet TAKE 1 TABLET (5 MG TOTAL) BY MOUTH DAILY. 90 tablet 0   SUMAtriptan (IMITREX) 100 MG tablet TAKE  1 TABLET BY MOUTH AT ONSET OF MIGRAINE, REPEAT DOSE IN 2 HRS IF NEEDED, MAX 2 TABS IN 24 HRS 9 tablet 2   SYNTHROID 75 MCG tablet TAKE 1 TABLET BY MOUTH DAILY BEFORE BREAKFAST. 90 tablet 1   topiramate (TOPAMAX) 25 MG tablet Take 1 tablet (25 mg total) by mouth in the morning, at noon, in the evening, and at bedtime. Instead take 1 in the morning and 3 at night 180 tablet 1   No current facility-administered medications on file prior to visit.   Past Medical History:  Diagnosis Date   Atrophy of thyroid (acquired)    Autoimmune thyroiditis    Chronic fatigue, unspecified    Gastro-esophageal reflux disease without esophagitis    Generalized anxiety disorder    Menopausal and female climacteric states    Migraine without aura, not  intractable, without status migrainosus    Peripheral vascular disease (HCC) 1992   blood clot with broken leg, coumadin x 6 mos   Vitamin D deficiency, unspecified    Past Surgical History:  Procedure Laterality Date   Nova sure ablation  2012   rotator cuff surgery  12/2014   TONSILLECTOMY     as a child    Family History  Problem Relation Age of Onset   Hypertension Mother    Hyperlipidemia Mother    Breast cancer Mother    Melanoma Mother    Migraines Mother    Diabetes type II Mother    Lung cancer Father    Hypertension Sister    Heart attack Maternal Grandmother    Heart attack Maternal Grandfather    Stroke Maternal Grandfather    Social History   Socioeconomic History   Marital status: Married    Spouse name: Raelan Burgoon   Number of children: 2   Years of education: 14   Highest education level: Associate degree: occupational, Scientist, product/process development, or vocational program  Occupational History   Occupation: Hair stylist  Tobacco Use   Smoking status: Never   Smokeless tobacco: Never  Vaping Use   Vaping status: Never Used  Substance and Sexual Activity   Alcohol use: Yes    Alcohol/week: 1.0 standard drink of alcohol    Types: 1 Glasses of wine per week   Drug use: Never   Sexual activity: Yes    Partners: Male    Birth control/protection: Surgical  Other Topics Concern   Not on file  Social History Narrative   ** Merged History Encounter **       Right-handedness One-story home   Social Drivers of Health   Financial Resource Strain: Low Risk  (04/06/2023)   Overall Financial Resource Strain (CARDIA)    Difficulty of Paying Living Expenses: Not hard at all  Food Insecurity: No Food Insecurity (04/06/2023)   Hunger Vital Sign    Worried About Running Out of Food in the Last Year: Never true    Ran Out of Food in the Last Year: Never true  Transportation Needs: No Transportation Needs (04/06/2023)   PRAPARE - Administrator, Civil Service  (Medical): No    Lack of Transportation (Non-Medical): No  Physical Activity: Inactive (04/06/2023)   Exercise Vital Sign    Days of Exercise per Week: 0 days    Minutes of Exercise per Session: 0 min  Stress: No Stress Concern Present (04/06/2023)   [employer] of Occupational Health - Occupational Stress Questionnaire    Feeling of Stress : Not at all  Social  Connections: Unknown (04/06/2023)   Social Connection and Isolation Panel [NHANES]    Frequency of Communication with Friends and Family: More than three times a week    Frequency of Social Gatherings with Friends and Family: Three times a week    Attends Religious Services: Patient declined    Active Member of Clubs or Organizations: No    Attends Banker Meetings: Never    Marital Status: Married    CONSTITUTIONAL: Negative for chills, fatigue, fever, unintentional weight gain and unintentional weight loss.  E/N/T: Negative for ear pain, nasal congestion and sore throat.  CARDIOVASCULAR:see HPI  RESPIRATORY: see HPI GASTROINTESTINAL: Negative for abdominal pain, acid reflux symptoms, constipation, diarrhea, nausea and vomiting.  MSK: Negative for arthralgias and myalgias.  INTEGUMENTARY: Negative for rash.  NEUROLOGICAL: Negative for dizziness and headaches.  PSYCHIATRIC: Negative for sleep disturbance and to question depression screen.  Negative for depression, negative for anhedonia.        Objective:  PHYSICAL EXAM:   VS: BP 118/76 (BP Location: Left Arm, Patient Position: Sitting, Cuff Size: Normal)   Pulse 67   Temp (!) 97.2 F (36.2 C) (Temporal)   Resp 18   Ht 5\' 6"  (1.676 m)   Wt 182 lb 6.4 oz (82.7 kg)   LMP  (LMP Unknown)   SpO2 99%   BMI 29.44 kg/m   GEN: Well nourished, well developed, in no acute distress   Cardiac: RRR; no murmurs, rubs, or gallops,no edema - no significant varicosities Respiratory:  normal respiratory rate and pattern with no distress - normal breath sounds  with no rales, rhonchi, wheezes or rubs GI: normal bowel sounds, no masses or tenderness MS: no deformity or atrophy  Skin: warm and dry, no rash  Neuro:  Alert and Oriented x 3- CN II-Xii grossly intact Psych: euthymic mood, appropriate affect and demeanor  EKG normal Lab Results  Component Value Date   WBC 6.6 10/03/2023   HGB 12.9 10/03/2023   HCT 39.7 10/03/2023   PLT 255 10/03/2023   GLUCOSE 85 10/03/2023   CHOL 177 04/06/2023   TRIG 77 04/06/2023   HDL 66 04/06/2023   LDLCALC 97 04/06/2023   ALT 17 10/03/2023   AST 18 10/03/2023   NA 141 10/03/2023   K 3.5 10/03/2023   CL 109 10/03/2023   CREATININE 0.58 10/03/2023   BUN 13 10/03/2023   CO2 25 10/03/2023   TSH 0.874 04/06/2023   HGBA1C 5.8 (H) 04/06/2023      Assessment & Plan:   Problem List Items Addressed This Visit       Endocrine   Acquired hypothyroidism Continue follow up with endocrinologist Continue syntrhoid qd Labwork pending     Other   Vitamin D insufficiency   Relevant Orders   VITAMIN D 25 Hydroxy (Vit-D Deficiency, Fractures)    Other Visit Diagnoses     Neuralgia    -  Primary   Relevant Orders   CBC with Differential/Platelet   Comprehensive metabolic panel Continue gabapentin Follow up with specialist as directed   Anxiety     Continue current meds   Mixed hyperlipidemia       Relevant Orders   CBC with Differential/Platelet   Comprehensive metabolic panel   Lipid panel Watch diet  Prediabetes Continue to watch diet  History of PE with abnormal chest CT Continue Eliquis 5mg  1 po bid Follow up with pulmonology as directed  Chest pain EKG - normal Sent for stat troponin     .  No orders of the defined types were placed in this encounter.   Orders Placed This Encounter  Procedures   CBC with Differential/Platelet   Comprehensive metabolic panel   TSH   Lipid panel   VITAMIN D 25 Hydroxy (Vit-D Deficiency, Fractures)   Hemoglobin A1c   Troponin I    EKG 12-Lead     Follow-up: Return in about 2 months (around 12/09/2023) for follow-up.  An After Visit Summary was printed and given to the patient.  Jettie Pagan [facility] [phone]

## 2023-10-12 ENCOUNTER — Encounter: Payer: Self-pay | Admitting: Physician Assistant

## 2023-10-12 LAB — COMPREHENSIVE METABOLIC PANEL
ALT: 15 [IU]/L (ref 0–32)
AST: 19 [IU]/L (ref 0–40)
Albumin: 4.1 g/dL (ref 3.8–4.9)
Alkaline Phosphatase: 67 [IU]/L (ref 44–121)
BUN/Creatinine Ratio: 21 (ref 9–23)
BUN: 14 mg/dL (ref 6–24)
Bilirubin Total: 0.4 mg/dL (ref 0.0–1.2)
CO2: 24 mmol/L (ref 20–29)
Calcium: 9.3 mg/dL (ref 8.7–10.2)
Chloride: 104 mmol/L (ref 96–106)
Creatinine, Ser: 0.68 mg/dL (ref 0.57–1.00)
Globulin, Total: 2.1 g/dL (ref 1.5–4.5)
Glucose: 82 mg/dL (ref 70–99)
Potassium: 4.4 mmol/L (ref 3.5–5.2)
Sodium: 141 mmol/L (ref 134–144)
Total Protein: 6.2 g/dL (ref 6.0–8.5)
eGFR: 102 mL/min/{1.73_m2} (ref >59)

## 2023-10-12 LAB — CBC WITH DIFFERENTIAL/PLATELET
Basophils Absolute: 0.1 10*3/uL (ref 0.0–0.2)
Basos: 1 %
EOS (ABSOLUTE): 0.2 10*3/uL (ref 0.0–0.4)
Eos: 3 %
Hematocrit: 39.8 % (ref 34.0–46.6)
Hemoglobin: 13.3 g/dL (ref 11.1–15.9)
Immature Grans (Abs): 0.1 10*3/uL (ref 0.0–0.1)
Immature Granulocytes: 1 %
Lymphocytes Absolute: 2.7 10*3/uL (ref 0.7–3.1)
Lymphs: 36 %
MCH: 31 pg (ref 26.6–33.0)
MCHC: 33.4 g/dL (ref 31.5–35.7)
MCV: 93 fL (ref 79–97)
Monocytes Absolute: 0.7 10*3/uL (ref 0.1–0.9)
Monocytes: 9 %
Neutrophils Absolute: 3.9 10*3/uL (ref 1.4–7.0)
Neutrophils: 50 %
Platelets: 274 10*3/uL (ref 150–450)
RBC: 4.29 x10E6/uL (ref 3.77–5.28)
RDW: 12.3 % (ref 11.7–15.4)
WBC: 7.6 10*3/uL (ref 3.4–10.8)

## 2023-10-12 LAB — HEMOGLOBIN A1C
Est. average glucose Bld gHb Est-mCnc: 117 mg/dL
Hgb A1c MFr Bld: 5.7 % — ABNORMAL HIGH (ref 4.8–5.6)

## 2023-10-12 LAB — LIPID PANEL
Chol/HDL Ratio: 3 {ratio} (ref 0.0–4.4)
Cholesterol, Total: 160 mg/dL (ref 100–199)
HDL: 54 mg/dL (ref >39)
LDL Chol Calc (NIH): 75 mg/dL (ref 0–99)
Triglycerides: 185 mg/dL — ABNORMAL HIGH (ref 0–149)
VLDL Cholesterol Cal: 31 mg/dL (ref 5–40)

## 2023-10-12 LAB — TSH: TSH: 0.766 u[IU]/mL (ref 0.450–4.500)

## 2023-10-12 LAB — VITAMIN D 25 HYDROXY (VIT D DEFICIENCY, FRACTURES): Vit D, 25-Hydroxy: 26.3 ng/mL — ABNORMAL LOW (ref 30.0–100.0)

## 2023-10-14 ENCOUNTER — Ambulatory Visit: Payer: 59 | Admitting: Pulmonary Disease

## 2023-10-14 VITALS — BP 102/82 | HR 75 | Ht 66.0 in | Wt 181.4 lb

## 2023-10-14 DIAGNOSIS — R0602 Shortness of breath: Secondary | ICD-10-CM

## 2023-10-14 NOTE — Patient Instructions (Signed)
 I will see in about 6 to 8 weeks  Schedule for pulmonary function test  Continue using albuterol as needed  Continue your blood thinners  Call us with significant concerns  The ultrasound of your heart that was done previously was normal-we do not need to repeat this No need to repeat any CAT scans or other studies at present

## 2023-10-16 NOTE — Progress Notes (Signed)
 [name]    [id number]    08-Jun-1966  Primary Care Physician:Davis, Alben Spittle  Referring Physician: Achille Rich, PA-C [address] 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   apixaban (ELIQUIS) 5 MG TABS tablet Take 2 tablets (10mg ) twice daily for 7 days, then 1 tablet (5mg ) twice daily   clonazePAM (KLONOPIN) 0.5 MG tablet TAKE 1 TABLET BY MOUTH EVERYDAY AT BEDTIME   escitalopram (LEXAPRO) 20 MG tablet TAKE 1 TABLET BY MOUTH EVERY DAY   famotidine (PEPCID) 40 MG tablet Take 40 mg by mouth daily as needed for heartburn or indigestion.   fexofenadine (ALLEGRA) 180 MG tablet Take 180 mg by mouth daily.   gabapentin (NEURONTIN) 300 MG capsule Take 1 capsule (300 mg total) by mouth 2 (two) times daily.   ipratropium-albuterol (DUONEB) 0.5-2.5 (3) MG/3ML SOLN Take 3 mLs by nebulization every 4 (four) hours as needed.   polyethylene glycol (MIRALAX / GLYCOLAX) 17 g packet Take 17 g by mouth daily.   rosuvastatin (CRESTOR) 5 MG tablet TAKE 1 TABLET (5 MG TOTAL) BY MOUTH DAILY.   SUMAtriptan (IMITREX) 100 MG tablet TAKE 1 TABLET BY MOUTH AT ONSET OF MIGRAINE, REPEAT  DOSE IN 2 HRS IF NEEDED, MAX 2 TABS IN 24 HRS   SYNTHROID 75 MCG tablet TAKE 1 TABLET BY MOUTH DAILY BEFORE BREAKFAST.   topiramate (TOPAMAX) 25 MG tablet Take 1 tablet (25 mg total) by mouth in the morning, at noon, in the evening, and at bedtime. Instead take 1 in the morning and 3 at night   [DISCONTINUED] lidocaine (XYLOCAINE) 2 % solution Gargle and swish 15 ml every 3 hours as needed for burning mouth. (Patient not taking: Reported on 10/14/2023)   No facility-administered encounter medications on file as of 10/14/2023.    Allergies as of 10/14/2023   (No Known Allergies)    Past Medical History:  Diagnosis Date   Atrophy of thyroid (acquired)    Autoimmune thyroiditis    Chronic fatigue, unspecified    Gastro-esophageal reflux disease without esophagitis    Generalized anxiety disorder    Menopausal and female climacteric states    Migraine without aura, not intractable, without status migrainosus    Peripheral vascular disease (HCC) 1992   blood clot with broken leg, coumadin x 6 mos   Vitamin D deficiency, unspecified     Past Surgical History:  Procedure Laterality Date   Nova sure ablation  2012   rotator cuff surgery  12/2014   TONSILLECTOMY     as a child    Family  History  Problem Relation Age of Onset   Hypertension Mother    Hyperlipidemia Mother    Breast cancer Mother    Melanoma Mother    Migraines Mother    Diabetes type II Mother    Lung cancer Father    Hypertension Sister    Heart attack Maternal Grandmother    Heart attack Maternal Grandfather    Stroke Maternal Grandfather     Social History   Socioeconomic History   Marital status: Married    Spouse name: Brodie Scovell   Number of children: 2   Years of education: 14   Highest education level: Associate degree: occupational, Scientist, product/process development, or vocational program  Occupational History   Occupation: Hair stylist  Tobacco Use   Smoking status: Never   Smokeless tobacco: Never  Vaping Use    Vaping status: Never Used  Substance and Sexual Activity   Alcohol use: Yes    Alcohol/week: 1.0 standard drink of alcohol    Types: 1 Glasses of wine per week   Drug use: Never   Sexual activity: Yes    Partners: Male    Birth control/protection: Surgical  Other Topics Concern   Not on file  Social History Narrative   ** Merged History Encounter **       Right-handedness One-story home   Social Drivers of Health   Financial Resource Strain: Low Risk  (04/06/2023)   Overall Financial Resource Strain (CARDIA)    Difficulty of Paying Living Expenses: Not hard at all  Food Insecurity: No Food Insecurity (04/06/2023)   Hunger Vital Sign    Worried About Running Out of Food in the Last Year: Never true    Ran Out of Food in the Last Year: Never true  Transportation Needs: No Transportation Needs (04/06/2023)   PRAPARE - Administrator, Civil Service (Medical): No    Lack of Transportation (Non-Medical): No  Physical Activity: Inactive (04/06/2023)   Exercise Vital Sign    Days of Exercise per Week: 0 days    Minutes of Exercise per Session: 0 min  Stress: No Stress Concern Present (04/06/2023)   [employer] of Occupational Health - Occupational Stress Questionnaire    Feeling of Stress : Not at all  Social Connections: Unknown (04/06/2023)   Social Connection and Isolation Panel [NHANES]    Frequency of Communication with Friends and Family: More than three times a week    Frequency of Social Gatherings with Friends and Family: Three times a week    Attends Religious Services: Patient declined    Active Member of Clubs or Organizations: No    Attends Banker Meetings: Never    Marital Status: Married  Catering manager Violence: Not At Risk (04/06/2023)   Humiliation, Afraid, Rape, and Kick questionnaire    Fear of Current or Ex-Partner: No    Emotionally Abused: No    Physically Abused: No    Sexually Abused: No    Review of Systems   Respiratory:  Positive for shortness of breath.     Vitals:   10/14/23 0957  BP: 102/82  Pulse: 75  SpO2: 98%    Physical Exam Constitutional:      Appearance: Normal appearance.  HENT:     Head: Normocephalic.     Mouth/Throat:     Mouth: Mucous membranes are moist.  Eyes:     General: No scleral icterus. Cardiovascular:     Rate and Rhythm: Normal rate and regular rhythm.  Heart sounds: No murmur heard. Pulmonary:     Effort: No respiratory distress.     Breath sounds: No stridor. No wheezing or rhonchi.  Musculoskeletal:     Cervical back: No rigidity or tenderness.  Neurological:     Mental Status: She is alert.  Psychiatric:        Mood and Affect: Mood normal.    Data Reviewed: CT chest was reviewed by myself-discussed with the patient-10/03/2023  Assessment:  History of DVT/PE Recent CT scan showing thrombus in right lower lobe, thrombosed aneurysmal dilatation  Shortness of breath -Albuterol helping  Pulmonary embolus  Plan/Recommendations:  Follow-up in about 6 to 8 weeks  Schedule for pulmonary function test  Use albuterol as needed  Continue Eliquis  Call with significant concerns   Virl Diamond MD [facility] 10/16/2023, 11:27 AM  CC: Achille Rich, PA-C

## 2023-10-20 ENCOUNTER — Other Ambulatory Visit: Payer: Self-pay | Admitting: Physician Assistant

## 2023-10-20 NOTE — Telephone Encounter (Signed)
 Copied from CRM [id number]. Topic: Clinical - Medication Refill >> Oct 20, 2023  9:50 AM Fuller Mandril wrote: Most Recent Primary Care Visit:  Provider: Marianne Sofia  Department: [facility]  Visit Type: OFFICE VISIT  Date: 10/11/2023  Medication: SUMAtriptan (IMITREX) 100 MG tablet  Has the patient contacted their pharmacy? Yes (Agent: If no, request that the patient contact the pharmacy for the refill. If patient does not wish to contact the pharmacy document the reason why and proceed with request.) (Agent: If yes, when and what did the pharmacy advise?) sent request   Is this the correct pharmacy for this prescription? Yes If no, delete pharmacy and type the correct one.  This is the patient's preferred pharmacy:  [id number] - [place], [place] - [address] [place] [address] Phone: [phone] Fax: [phone]   Has the prescription been filled recently? No  Is the patient out of the medication? Yes  Has the patient been seen for an appointment in the last year OR does the patient have an upcoming appointment? Yes  Can we respond through MyChart? Yes  Agent: Please be advised that Rx refills may take up to 3 business days. We ask that you follow-up with your pharmacy.

## 2023-10-21 MED ORDER — SUMATRIPTAN SUCCINATE 100 MG PO TABS: 100.0000 mg | ORAL_TABLET | ORAL | 2 refills | Status: AC | PRN

## 2023-12-07 ENCOUNTER — Other Ambulatory Visit: Payer: Self-pay | Admitting: Physician Assistant

## 2023-12-15 ENCOUNTER — Encounter: Payer: Self-pay | Admitting: Internal Medicine

## 2023-12-15 ENCOUNTER — Ambulatory Visit: Payer: 59 | Admitting: Internal Medicine

## 2023-12-15 VITALS — BP 120/80 | HR 76 | Ht 66.0 in | Wt 183.0 lb

## 2023-12-15 DIAGNOSIS — E039 Hypothyroidism, unspecified: Secondary | ICD-10-CM

## 2023-12-15 DIAGNOSIS — E559 Vitamin D deficiency, unspecified: Secondary | ICD-10-CM | POA: Diagnosis not present

## 2023-12-15 DIAGNOSIS — E063 Autoimmune thyroiditis: Secondary | ICD-10-CM | POA: Diagnosis not present

## 2023-12-15 DIAGNOSIS — R7303 Prediabetes: Secondary | ICD-10-CM

## 2023-12-15 DIAGNOSIS — R5382 Chronic fatigue, unspecified: Secondary | ICD-10-CM

## 2023-12-15 MED ORDER — SYNTHROID 75 MCG PO TABS: 75.0000 ug | ORAL_TABLET | Freq: Every day | ORAL | 3 refills | Status: DC

## 2023-12-15 NOTE — Patient Instructions (Signed)
Continue Synthoid 75 mg daily.  Take the thyroid hormone every day, with water, at least 30 minutes before breakfast, separated by at least 4 hours from: - acid reflux medications - calcium - iron - multivitamins  Continue vitamin D 5000 units daily.  Please come back for a follow-up appointment in 1 year.

## 2023-12-15 NOTE — Progress Notes (Signed)
 Patient ID: [name], female   DOB: April 19, 1966, 58 y.o.   MRN: [id number]   HPI  [name] Medina is a 58 y.o.-year-old female, initially referred by her PCP, Dr. Harless Medina, returning for follow-up for hypothyroidism, diagnosed as Hashimoto's hypothyroidism.  Last visit 1 year ago.  Interim history: She started on weight watchers 08/2021.  She lost 33 pounds initially on this but then started to gain the weight back.  Before last visit she gained 4 pounds and since then she gained another 16 pounds.  The weight watcher clinic closed and she is trying to stay on the diet at home but this is difficult. 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 She will have PFTs soon.   Reviewed history: Patient was diagnosed with hypothyroidism in 11/2005 >> she initially started on Synthroid  d.a.w. 75 mcg daily.  Her hypothyroidism was previously managed by Dr. Harless Medina and more recently by her PCP.  Her dose was reduced  from 112 down to 75 mcg daily, dose that she continued at our 1st visit in 05/2019.  At that time, she had several symptoms possibly related to the thyroid  so we changed to Armour Thyroid . We initially started 45 mg daily in 05/2019, and we increased the dose to 60 mg daily, and then to 75 mg daily.  However, in 05/2021, per her request, we changed to Synthroid  d.a.w.,  Initially 125 mcg daily, but then gradually decrease to 75 mcg daily in 11/2021.  In 12/2021, we decreased the dose to 50 mcg daily, but increase the dose back to 75 mcg daily in 02/2022.  She takes Synthroid : - in am (5 am) - fasting - 1h after: black coffee - at least 4 hours from b'fast - no Ca, Fe, PPIs - +MVI >4h after LT4 -Previously on biotin 5000 mcg daily, then came off.  Reviewed patient's TFTs: Lab Results  Component Value Date   TSH 0.766 10/11/2023   TSH 0.874 04/06/2023   TSH 0.776 10/05/2022   TSH 1.26 06/16/2022   TSH 1.56 04/12/2022   TSH 9.42 (H) 02/22/2022   TSH 0.09 (L)  12/30/2021   TSH 0.04 (L) 12/01/2021   TSH 0.02 (L) 10/19/2021   TSH 0.02 (L) 09/14/2021   FREET4 1.12 04/06/2023   FREET4 0.72 06/16/2022   FREET4 0.81 04/12/2022   FREET4 0.62 02/22/2022   FREET4 1.09 12/30/2021   FREET4 0.91 12/01/2021   FREET4 1.09 10/19/2021   FREET4 1.37 09/14/2021   FREET4 1.22 08/05/2021   FREET4 0.55 (L) 06/08/2021   T3FREE 2.3 04/06/2023   T3FREE 3.3 08/05/2021   T3FREE 2.2 (L) 06/08/2021   T3FREE 4.1 04/28/2021   T3FREE 2.9 12/08/2020   T3FREE 2.8 10/30/2020   T3FREE 3.0 03/31/2020   T3FREE 2.8 10/25/2019   T3FREE 3.8 09/10/2019   T3FREE 3.2 07/05/2019  04/02/2019: TSH 1.81 (0.4-4.0) - on 75 mcg daily 02/15/2018: TSH TSH 0.23 - on 112 mcg daily >> dose decreased to 88 mcg daily  Her antithyroid antibodies were elevated pointing towards a diagnosis of Hashimoto's thyroiditis: Component     Latest Ref Rng 05/24/2019 03/31/2020 12/08/2020  Thyroperoxidase Ab SerPl-aCnc     <9 IU/mL 98 (H)  59 (H)  105 (H)   Thyroglobulin Ab     < or = 1 IU/mL <1  <1  <1   We started selenium 200 mcg daily in 09/2019.  However, antibodies are still elevated on this so I advised her that  she can stop selenium.  She described several symptoms which improved after switching to Armour Thyroid : Fatigue, foggy brain, insomnia, weight gain (50 pounds in 5 years), joint pain, heat and cold intolerance, constipation, dry skin, hair loss, depression.  Pt denies: - feeling nodules in neck - dysphagia - choking  She has + FH of thyroid  disorders in: M aunt and uncle. No FH of thyroid  cancer. No h/o radiation tx to head or neck. No recent steroids use.  Previously on thyroid  Energy- [name] Medina, Iodine, [name] Medina -but stopped due to GERD. On [name] Medina  at night.  She had a mildly low vitamin D  level, discovered in the setting of joint pains, which improved after starting supplementation: Lab Results  Component Value Date   VD25OH 26.3 (L) 10/11/2023   VD25OH 39.9 04/06/2023    VD25OH 67.8 10/05/2022   VD25OH 66.3 04/01/2022   VD25OH 46.6 09/29/2021   VD25OH 40.0 04/28/2021   VD25OH 28.4 (L) 03/24/2021   VD25OH 17.9 (L) 12/08/2020   VD25OH 58.0 03/31/2020   VD25OH 26.01 (L) 10/25/2019   We started 2000 units vitamin D  daily.  We increased the dose to 4000 units daily and then to 5000 units daily.  She continues on this dose today.  She was started on Metformin  500 mg daily (05/2022).  However, since then, she took herself off metformin .  She occasionally checks blood sugars in the morning and they are between 90-110s.  Latest HbA1c: Lab Results  Component Value Date   HGBA1C 5.7 (H) 10/11/2023   HGBA1C 5.8 (H) 04/06/2023   HGBA1C 5.7 (H) 10/05/2022   HGBA1C 5.6 07/15/2022   HGBA1C 5.4 04/01/2022   HGBA1C 5.7 (H) 10/22/2021   In 2023, she had chest pain and had cardiac investigation, which was negative. She has a history of anxiety and depression-on [name] Medina  and [name] Medina . She sees neurology for idiopathic polyneuropathy - on [name] Medina . She started [name] Medina  in 11/2022.  ROS: + See HPI  I reviewed pt's medications, allergies, PMH, social hx, family hx, and changes were documented in the history of present illness. Otherwise, unchanged from my initial visit note.  Past Medical History:  Diagnosis Date   Atrophy of thyroid  (acquired)    Autoimmune thyroiditis    Chronic fatigue, unspecified    Gastro-esophageal reflux disease without esophagitis    Generalized anxiety disorder    Menopausal and female climacteric states    Migraine without aura, not intractable, without status migrainosus    Peripheral vascular disease (HCC) 1992   blood clot with broken leg, coumadin x 6 mos   Vitamin D  deficiency, unspecified    Past Surgical History:  Procedure Laterality Date   Nova sure ablation  2012   rotator cuff surgery  12/2014   TONSILLECTOMY     as a child   Social History   Socioeconomic History   Marital status: Married    Spouse name: Not  on file   Number of children: 60 -72 years old in 05/2019 she also takes care of her 81-year-old niece   Years of education: Not on file   Highest education level: Not on file  Occupational History    Hairstylist  Social Needs   Financial resource strain: Not on file   Food insecurity    Worry: Not on file    Inability: Not on file   Transportation needs    Medical: Not on file    Non-medical: Not on file  Tobacco Use   Smoking status: Never Smoker   Smokeless tobacco:  Never Used  Substance and Sexual Activity   Alcohol use: Yes    Alcohol/week:     Types:  3 drinks per week-wine or beer   Drug use: No   Sexual activity: Yes    Birth control/protection: Surgical    Comment: husband had vasectomy   Current Outpatient Medications on File Prior to Visit  Medication Sig Dispense Refill   albuterol  (VENTOLIN  HFA) 108 (90 Base) MCG/ACT inhaler Inhale 2 puffs into the lungs every 6 (six) hours as needed for wheezing or shortness of breath. 8 g 2   apixaban  (ELIQUIS ) 5 MG TABS tablet Take 2 tablets (10mg ) twice daily for 7 days, then 1 tablet (5mg ) twice daily 60 tablet 3   [name] Medina  (KLONOPIN ) 0.5 MG tablet TAKE 1 TABLET BY MOUTH EVERYDAY AT BEDTIME 30 tablet 0   escitalopram  ([name] Medina ) 20 MG tablet TAKE 1 TABLET BY MOUTH EVERY DAY 90 tablet 1   famotidine  (PEPCID ) 40 MG tablet Take 40 mg by mouth daily as needed for heartburn or indigestion.     fexofenadine (ALLEGRA) 180 MG tablet Take 180 mg by mouth daily.     [name] Medina  (NEURONTIN ) 300 MG capsule Take 1 capsule (300 mg total) by mouth 2 (two) times daily. 60 capsule 3   ipratropium-albuterol  (DUONEB) 0.5-2.5 (3) MG/3ML SOLN Take 3 mLs by nebulization every 4 (four) hours as needed. 360 mL 0   polyethylene glycol (MIRALAX / GLYCOLAX) 17 g packet Take 17 g by mouth daily.     rosuvastatin  ([name] Medina ) 5 MG tablet TAKE 1 TABLET (5 MG TOTAL) BY MOUTH DAILY. 90 tablet 0   SUMAtriptan  (IMITREX ) 100 MG tablet Take 1 tablet (100 mg total) by  mouth every 2 (two) hours as needed for migraine. May repeat in 2 hours if headache persists or recurs. 9 tablet 2   SYNTHROID  75 MCG tablet TAKE 1 TABLET BY MOUTH DAILY BEFORE BREAKFAST. 90 tablet 1   topiramate  (TOPAMAX ) 25 MG tablet Take 1 tablet (25 mg total) by mouth in the morning, at noon, in the evening, and at bedtime. Instead take 1 in the morning and 3 at night 180 tablet 1   No current facility-administered medications on file prior to visit.   No Known Allergies   Family history:  Diabetes in mother and grandmother HTN and HL in mother and maternal aunt Heart disease in mother, maternal grandfather Breast and skin cancer in mother Also, biological father died of cancer  PE: BP 120/80   Pulse 76   Ht 5\' 6"  (1.676 m)   Wt 183 lb (83 kg)   SpO2 99%   BMI 29.54 kg/m  Wt Readings from Last 20 Encounters:  12/15/23 183 lb (83 kg)  10/14/23 181 lb 6.4 oz (82.3 kg)  10/11/23 182 lb 6.4 oz (82.7 kg)  10/03/23 182 lb 15.7 oz (83 kg)  09/29/23 181 lb 6.4 oz (82.3 kg)  09/27/23 183 lb (83 kg)  08/01/23 174 lb (78.9 kg)  05/24/23 172 lb (78 kg)  04/06/23 170 lb (77.1 kg)  02/21/23 167 lb (75.8 kg)  12/15/22 167 lb 9.6 oz (76 kg)  11/01/22 165 lb (74.8 kg)  10/05/22 165 lb (74.8 kg)  07/15/22 169 lb (76.7 kg)  06/17/22 162 lb (73.5 kg)  06/16/22 163 lb 3.2 oz (74 kg)  05/14/22 165 lb (74.8 kg)  05/10/22 165 lb 3.2 oz (74.9 kg)  05/07/22 165 lb 3.2 oz (74.9 kg)  04/01/22 167 lb 9.6 oz (76 kg)   Constitutional: overweight,  in NAD Eyes:  EOMI, no exophthalmos ENT: no neck masses, no cervical lymphadenopathy Cardiovascular: RRR, No MRG Respiratory: CTA B Musculoskeletal: no deformities Skin:no rashes Neurological: + very faint tremor with outstretched hands  ASSESSMENT: 1. Hypothyroidism -Diagnosed as Hashimoto's hypothyroidism  2.  Vitamin D  insufficiency  3. Fatigue  4.  Prediabetes  PLAN:  1. Patient with longstanding hypothyroidism, previously on  levothyroxine  therapy, then on Armour thyroid , now on brand-name Synthroid  per her request.  Was previously on selenium supplements, now off.  TPO antibodies were still elevated at last check. -She was on 125 mcg of levothyroxine  initially (05/2021) and we then had to decrease the dose relatively rapidly to 75 mcg daily (11/2021).  Afterwards, TSH was still suppressed and I advised her to decrease the dose to 50 mcg daily.  However, TSH returned elevated afterwards so we went back to 75 mcg daily. - latest thyroid  labs reviewed with pt. >> normal: Lab Results  Component Value Date   TSH 0.766 10/11/2023  - she continues on LT4 75 mcg daily - pt feels good on this dose except for fatigue, which is chronic for her.  She lost a significant amount of weight previously on weight watchers.  She gained 16 pounds since last visit to the weight watcher clinic closed.  She is trying to stick to the diet by herself at home, but she finds it difficult. - we discussed about taking the thyroid  hormone every day, with water, >30 minutes before breakfast, separated by >4 hours from acid reflux medications, calcium , iron, multivitamins. Pt. is taking it correctly and does not miss doses. - will refill her LT4 now - I will see her back in a year  2. vitamin D  insufficiency -We checked a vitamin D  level for her in the past due to generalized joint pains.  This returned slightly low - We started her on 2000 units vitamin D  daily but then increase to 5000 units daily.  She continues on this dose. - Vitamin D  level was slightly low at last check: Lab Results  Component Value Date   VD25OH 26.3 (L) 10/11/2023  - Further management per PCP -she has another appointment coming up later this month and will have another level checked  3. Fatigue - Chronic, persistent -Possibly related to menopause -she cannot use hormone replacement due to previous history of DVT/PE - We previously ruled out adrenal insufficiency-a  cosyntropin  stimulation test and DHEA-S levels were normal   4.  Prediabetes - her HbA1c levels are very low in the prediabetic range.  Latest HbA1c was 5.7% (09/2023), borderline prediabetic, and improved from previously. - Was initially on metformin  but she took herself off the medication - At last visit I did not recommend restarting metformin  but work on diet and exercise and have her HbA1c levels checked by PCP annually.  [name] Harden, MD PhD [facility]

## 2023-12-25 ENCOUNTER — Other Ambulatory Visit: Payer: Self-pay | Admitting: Physician Assistant

## 2023-12-25 DIAGNOSIS — E782 Mixed hyperlipidemia: Secondary | ICD-10-CM

## 2023-12-26 ENCOUNTER — Other Ambulatory Visit: Payer: Self-pay

## 2023-12-26 MED ORDER — GABAPENTIN 300 MG PO CAPS: 300.0000 mg | ORAL_CAPSULE | Freq: Two times a day (BID) | ORAL | 3 refills | Status: DC

## 2023-12-27 ENCOUNTER — Encounter: Payer: Self-pay | Admitting: Physical Medicine and Rehabilitation

## 2023-12-27 ENCOUNTER — Encounter: Payer: 59 | Attending: Physical Medicine and Rehabilitation | Admitting: Physical Medicine and Rehabilitation

## 2023-12-27 VITALS — BP 109/75 | HR 66 | Ht 66.0 in | Wt 186.2 lb

## 2023-12-27 DIAGNOSIS — R7303 Prediabetes: Secondary | ICD-10-CM | POA: Insufficient documentation

## 2023-12-27 DIAGNOSIS — M792 Neuralgia and neuritis, unspecified: Secondary | ICD-10-CM | POA: Diagnosis present

## 2023-12-27 DIAGNOSIS — G4701 Insomnia due to medical condition: Secondary | ICD-10-CM | POA: Insufficient documentation

## 2023-12-27 NOTE — Patient Instructions (Signed)
 Red light therapy  Foods that may reduce pain: 1) Ginger (especially studied for arthritis)- reduce leukotriene production to decrease inflammation 2) Blueberries- high in phytonutrients that decrease inflammation 3) Salmon- marine omega-3s reduce joint swelling and pain 4) Pumpkin seeds- reduce inflammation 5) dark chocolate- reduces inflammation 6) turmeric- reduces inflammation 7) tart cherries - reduce pain and stiffness 8) extra virgin olive oil - its compound olecanthal helps to block prostaglandins  9) chili peppers- can be eaten or applied topically via capsaicin  10) mint- helpful for headache, muscle aches, joint pain, and itching 11) garlic- reduces inflammation 12) Green tea- reduces inflammation and oxidative stress, helps with weight loss, may reduce the risk of cancer, recommend Double Green Matcha [place] of Tea daily  Link to further information on diet for chronic pain: [email or web address]

## 2023-12-27 NOTE — Progress Notes (Signed)
 Subjective:    Patient ID: [name], female    DOB: May 27, 1966, 58 y.o.   MRN: [id number]  HPI [name] is a 58 year year old woman presenting for follow-up of peripheral neuropathy.   1) Peripheral neuropathy -average pain is 6/10 -pain has been stable -tried topamax  last visit -she has not seen any negative side effects to the topamax  -had an allergic reaction to Qutenza  -pain has been stable -at some times it is worse than others.  -metformin  helps -has been rough month with the feet burning and aching -she has been rubbing lotion on the feet- blue emu oil, has been mixing with lidocaine  -started back hurting in her toes and ankles -has an appointment with Dr. Annamarie Barrier neurology as it is moving into her fingers -has been present for a year -she is reading my pain journal book and she loves blueberries and cherries -she has never been diagnosed with diabetes -pain is present in forefoot and top of toes -the Qutenza  hurt very badly last time so does not want to repeat -toes are constantly tingling -she feels sharp pains like knives -she has tried the TENS unit- this does help ease it. -she takes an extra gabapentin  at night if the pain is really bad  2) Overweight -she has tried Weight Watchers and lost 21 bs but this did not help her pain Current BMI is 28.25 and weight is 169 -has lost weight due to the topamax   3) Plantar fasciitis: -she was diagnosed with plantar fasciitis and this was treated.   4) Prediabetes -discussed HgbA1c with her, she has followup with PCP -her PCP was unhappy that she was started on metformin  since her hemoglobin A1c  5) Insomnia:  -currently topamax  BID helping -her sleep fluctuates  6) Migraines: -worse with the weather -has never had food allergy blood test  7) Fall: -had a hard fall Pain Inventory Average Pain 6 Pain Right Now 4 My pain is constant, sharp, burning, dull, stabbing, tingling, and aching  In the  last 24 hours, has pain interfered with the following? General activity 4 Relation with others 0 Enjoyment of life 4 What TIME of day is your pain at its worst? morning , daytime, evening, and night Sleep (in general) Fair  Pain is worse with: walking and standing Pain improves with: rest, medication, and TENS Relief from Meds: 6     Family History  Problem Relation Age of Onset   Hypertension Mother    Hyperlipidemia Mother    Breast cancer Mother    Melanoma Mother    Migraines Mother    Diabetes type II Mother    Lung cancer Father    Hypertension Sister    Heart attack Maternal Grandmother    Heart attack Maternal Grandfather    Stroke Maternal Grandfather    Social History   Socioeconomic History   Marital status: Married    Spouse name: Laraya Roberts   Number of children: 2   Years of education: 14   Highest education level: Associate degree: occupational, Scientist, product/process development, or vocational program  Occupational History   Occupation: Hair stylist  Tobacco Use   Smoking status: Never   Smokeless tobacco: Never  Vaping Use   Vaping status: Never Used  Substance and Sexual Activity   Alcohol use: Yes    Alcohol/week: 1.0 standard drink of alcohol    Types: 1 Glasses of wine per week   Drug use: Never   Sexual activity: Yes  Partners: Male    Birth control/protection: Surgical  Other Topics Concern   Not on file  Social History Narrative   ** Merged History Encounter **       Right-handedness One-story home   Social Drivers of Health   Financial Resource Strain: Low Risk  (04/06/2023)   Overall Financial Resource Strain (CARDIA)    Difficulty of Paying Living Expenses: Not hard at all  Food Insecurity: No Food Insecurity (04/06/2023)   Hunger Vital Sign    Worried About Running Out of Food in the Last Year: Never true    Ran Out of Food in the Last Year: Never true  Transportation Needs: No Transportation Needs (04/06/2023)   PRAPARE - Therapist, art (Medical): No    Lack of Transportation (Non-Medical): No  Physical Activity: Inactive (04/06/2023)   Exercise Vital Sign    Days of Exercise per Week: 0 days    Minutes of Exercise per Session: 0 min  Stress: No Stress Concern Present (04/06/2023)   [employer] of Occupational Health - Occupational Stress Questionnaire    Feeling of Stress : Not at all  Social Connections: Unknown (04/06/2023)   Social Connection and Isolation Panel [NHANES]    Frequency of Communication with Friends and Family: More than three times a week    Frequency of Social Gatherings with Friends and Family: Three times a week    Attends Religious Services: Patient declined    Active Member of Clubs or Organizations: No    Attends Banker Meetings: Never    Marital Status: Married   Past Surgical History:  Procedure Laterality Date   Norlin Beck sure ablation  2012   rotator cuff surgery  12/2014   TONSILLECTOMY     as a child   Past Medical History:  Diagnosis Date   Atrophy of thyroid  (acquired)    Autoimmune thyroiditis    Chronic fatigue, unspecified    Gastro-esophageal reflux disease without esophagitis    Generalized anxiety disorder    Menopausal and female climacteric states    Migraine without aura, not intractable, without status migrainosus    Peripheral vascular disease (HCC) 1992   blood clot with broken leg, coumadin x 6 mos   Vitamin D  deficiency, unspecified    BP 109/75   Pulse 66   Ht 5\' 6"  (1.676 m)   Wt 186 lb 3.2 oz (84.5 kg)   SpO2 99%   BMI 30.05 kg/m   Opioid Risk Score:   Fall Risk Score:  `1  Depression screen [facility] 2/9     12/27/2023    9:23 AM 10/11/2023    8:52 AM 09/27/2023   11:27 AM 04/06/2023    9:03 AM 02/21/2023    9:30 AM 11/01/2022   11:08 AM 07/15/2022    9:23 AM  Depression screen PHQ 2/9  Decreased Interest 0 0 0 0 0 0 0  Down, Depressed, Hopeless 0 0 0 0 0 0 0  PHQ - 2 Score 0 0 0 0 0 0 0  Altered sleeping  3  2      Tired, decreased energy  0  2     Change in appetite  0  2     Feeling bad or failure about yourself   0  0     Trouble concentrating  0  2     Moving slowly or fidgety/restless  0  0     Suicidal thoughts  0  0     PHQ-9 Score  3  8     Difficult doing work/chores  Not difficult at all  Not difficult at all        Review of Systems  Musculoskeletal:  Positive for gait problem.       Bilateral feet and hands  Neurological:  Positive for numbness.       Tingling  All other systems reviewed and are negative.      Objective:   Physical Exam Gen: no distress, normal appearing HEENT: oral mucosa pink and moist, NCAT Cardio: Reg rate Chest: normal effort, normal rate of breathing Abd: soft, non-distended Ext: no edema Psych: pleasant, normal affect Skin: intact Neuro: Alert and oriented x3, sensation intact in feet is intact    Assessment & Plan:  1) Peripheral neuropathy -will defer Qutenza  -continue gabapentin  -discussed benefits of red light therapy  -discussed that Journavx is a highly selective inhibitor for Nav 1.8, which is specific for pain in the peripheral nervous system, discussed that lidocaine  in contrast affects all Nav receptors, discussed loading dose is 2 50mg  tablets and this can be taken 1 hour before food, discussed that then 50mg  can be taken with out without food q12H until pain is tolerable. Discussed that potential side effects are pruritus, muscle spasms, increase blood creatinine, rash. Discussed that we have samples available and we have copay cards available. Discussed that the 2 phase 3 clinicial trials sent to the FDA were for abdominoplasty and bunionectomy, discussed that it has been currently studied for 14 days, discussed that it can reduce efficacy of opioids and benzodiazepines, discussed that it has not be studied in severe hepatic impairment. Discussed that it is contraindicated with fluconazole. Discussed that it can interfere with hormonal  contraception up to 28 days after use. Discussed that it can decrease fertility, it has not been studied in pregnancy, that it has been present in animal milk during lactation, not recommended for GFR <15, discussed that outpatient if the medication requires a prior auth the copay should be $30 for at least a 60 day supply. The medication may be more likely to be in stock in [facility] and [facility]. We do have samples available   -methylated B vitamin can help, discussed this -will defer metformin  as her other physician does not want her on this -hgbA1c reviewed and is 5.8, due later this month for a recheck -discussed that she tried berberine -f/u with neurology -change topamax  to 2 tabs in the morning and 2 at night -discussed that pain is worst at night -Provided with a pain relief journal and discussed that it contains foods and lifestyle tips to naturally help to improve pain. Discussed that these lifestyle strategies are also very good for health unlike some medications which can have negative side effects. Discussed that the act of keeping a journal can be therapeutic and helpful to realize patterns what helps to trigger and alleviate pain.   -Discussed current symptoms of pain and history of pain.  -Discussed benefits of exercise in reducing pain. -Discussed following foods that may reduce pain: 1) Ginger (especially studied for arthritis)- reduce leukotriene production to decrease inflammation 2) Blueberries- high in phytonutrients that decrease inflammation 3) Salmon- marine omega-3s reduce joint swelling and pain 4) Pumpkin seeds- reduce inflammation 5) dark chocolate- reduces inflammation 6) turmeric- reduces inflammation 7) tart cherries - reduce pain and stiffness 8) extra virgin olive oil - its compound olecanthal helps to block prostaglandins  9) chili peppers- can be  eaten or applied topically via capsaicin  10) mint- helpful for headache, muscle aches, joint pain, and itching 11)  garlic- reduces inflammation  Link to further information on diet for chronic pain: http://[email or web address].com/   2) Overweight: -Discussed the benefits of intermittent fasting. Recommended starting with pushing dinner 15 minutes earlier and when this feels easy, continuing to push dinner 15 minutes earlier. Discussed that this can help her body to improve its ability to burn fat rather than glucose, improving insulin sensitivity. Recommended drinking Roobois tea in the evening to help curb appetite and for its numerous health benefits.   Commended on 9 lb weight loss! Discussed current weight is 165 lbs.  -prescribed topamax , increase to TID -discussed that weight has been stable -discussed that the weight watchers clinic was very helpful but the online app is not as helpful. -discussed her recent weight gain -discussed that she has not been able to exercise  3) Prediabetes: -d/c metformin   -discussed berberine 500mg  TID -continue fruits and vegetables -repeat HgbA1c reviewed and shows elevation to 5.8.  -discussed discussing with endocrinologists -Discussed the benefits of intermittent fasting. Recommended starting with pushing dinner 15 minutes earlier and when this feels easy, continuing to push dinner 15 minutes earlier. Discussed that this can help her body to improve its ability to burn fat rather than glucose, improving insulin sensitivity. Recommended drinking Roobois tea in the evening to help curb appetite and for its numerous health benefits.   -discussed benefits of continuous blood glucose monitoring.   4) Fall -discussed that recent fall was due to balance issue  5) Impaired balance: -discussed that the gabapentin  and topamax  can worsen the balance  6) Shingles in mouth: -discussed that this occurred after a dental procedure -discussed that she had a root canal and it didn't take and she needed her tooth  pulled and an implant placed -discussed that the titanium implant she felt irritated her -discussed that she developed shingles in her tongue -discussed that her gabapentin  was increased and she was prescribed mouthwash -discussed that it took weeks to heal -discussed that it was a big blow to her to have the tooth removed  7) Hypothyroidism: -continue Synthroid   8) Insomnia:  -continue melatonin

## 2024-01-02 ENCOUNTER — Encounter: Payer: Self-pay | Admitting: Pulmonary Disease

## 2024-01-02 ENCOUNTER — Ambulatory Visit: Payer: 59 | Admitting: Pulmonary Disease

## 2024-01-02 VITALS — BP 108/60 | HR 80 | Ht 66.0 in | Wt 185.0 lb

## 2024-01-02 DIAGNOSIS — R0602 Shortness of breath: Secondary | ICD-10-CM

## 2024-01-02 DIAGNOSIS — I272 Pulmonary hypertension, unspecified: Secondary | ICD-10-CM

## 2024-01-02 DIAGNOSIS — Z7901 Long term (current) use of anticoagulants: Secondary | ICD-10-CM

## 2024-01-02 LAB — PULMONARY FUNCTION TEST
DL/VA % pred: 109 %
DL/VA: 4.56 ml/min/mmHg/L
DLCO unc % pred: 85 %
DLCO unc: 18.81 ml/min/mmHg
FEF 25-75 Post: 2.4 L/s
FEF 25-75 Pre: 2.46 L/s
FEF2575-%Change-Post: -2 %
FEF2575-%Pred-Post: 92 %
FEF2575-%Pred-Pre: 94 %
FEV1-%Change-Post: -1 %
FEV1-%Pred-Post: 72 %
FEV1-%Pred-Pre: 73 %
FEV1-Post: 2.05 L
FEV1-Pre: 2.07 L
FEV1FVC-%Change-Post: -1 %
FEV1FVC-%Pred-Pre: 105 %
FEV6-%Change-Post: 1 %
FEV6-%Pred-Post: 71 %
FEV6-%Pred-Pre: 70 %
FEV6-Post: 2.5 L
FEV6-Pre: 2.48 L
FEV6FVC-%Pred-Post: 103 %
FEV6FVC-%Pred-Pre: 103 %
FVC-%Change-Post: 0 %
FVC-%Pred-Post: 68 %
FVC-%Pred-Pre: 68 %
FVC-Post: 2.5 L
FVC-Pre: 2.48 L
Post FEV1/FVC ratio: 82 %
Post FEV6/FVC ratio: 100 %
Pre FEV1/FVC ratio: 83 %
Pre FEV6/FVC Ratio: 100 %
RV % pred: 102 %
RV: 2.07 L
TLC % pred: 85 %
TLC: 4.57 L

## 2024-01-02 NOTE — Patient Instructions (Signed)
 I will see you in about 6 months  Call us  with significant concerns  Use the albuterol  as needed  Your breathing study shows very minimal narrowing in the airway  Call us  with significant concerns

## 2024-01-02 NOTE — Patient Instructions (Signed)
 Full pft performed today.

## 2024-01-02 NOTE — Progress Notes (Signed)
 WING GFELLER    [id number]    1966/02/22  Primary Care Physician:Davis, Janice Meeter  Referring Physician: Cyndi Drain, PA-C 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  Did have a DVT about 7 years ago 99 Due to did have a PE for which she was on anticoagulation for many months  Was seen in the ED recently and CT showed a thrombus in the lower right lower lobe  Breathing is a little bit better compared to the last time Occasionally uses albuterol  but has not really noticed any significant benefit  Never smoker  Works as a Scientist, research (medical)  Sometimes with persistently cold weather, will have more symptoms  Overall breathing feels a little bit better since her last visit  Not limited with activities of daily living  Tolerating anticoagulation well   Outpatient Encounter Medications as of 01/02/2024  Medication Sig   albuterol  (VENTOLIN  HFA) 108 (90 Base) MCG/ACT inhaler Inhale 2 puffs into the lungs every 6 (six) hours as needed for wheezing or shortness of breath.   apixaban  (ELIQUIS ) 5 MG TABS tablet Take 2 tablets (10mg ) twice daily for 7 days, then 1 tablet (5mg ) twice daily   clonazePAM  (KLONOPIN ) 0.5 MG tablet TAKE 1 TABLET BY MOUTH EVERYDAY AT BEDTIME   escitalopram  (LEXAPRO ) 20 MG tablet TAKE 1 TABLET BY MOUTH EVERY DAY   famotidine  (PEPCID ) 40 MG tablet Take 40 mg by mouth daily as needed for heartburn or indigestion.   fexofenadine (ALLEGRA) 180 MG tablet Take 180 mg by mouth daily.   gabapentin  (NEURONTIN ) 300 MG capsule Take 1 capsule (300 mg total) by mouth 2 (two) times daily.   ipratropium-albuterol  (DUONEB) 0.5-2.5 (3) MG/3ML SOLN Take 3 mLs by nebulization every 4  (four) hours as needed.   polyethylene glycol (MIRALAX / GLYCOLAX) 17 g packet Take 17 g by mouth daily.   rosuvastatin  (CRESTOR ) 5 MG tablet TAKE 1 TABLET (5 MG TOTAL) BY MOUTH DAILY.   SUMAtriptan  (IMITREX ) 100 MG tablet Take 1 tablet (100 mg total) by mouth every 2 (two) hours as needed for migraine. May repeat in 2 hours if headache persists or recurs.   SYNTHROID  75 MCG tablet Take 1 tablet (75 mcg total) by mouth daily before breakfast.   topiramate  (TOPAMAX ) 25 MG tablet Take 1 tablet (25 mg total) by mouth in the morning, at noon, in the evening, and at bedtime. Instead take 1 in the morning and 3 at night   [DISCONTINUED] clonazePAM  (KLONOPIN ) 0.5 MG tablet TAKE 1 TABLET BY MOUTH EVERYDAY AT BEDTIME   [DISCONTINUED] gabapentin  (NEURONTIN ) 300 MG capsule Take 1 capsule (300 mg total) by mouth 2 (two) times daily.   [DISCONTINUED] rosuvastatin  (CRESTOR ) 5 MG tablet TAKE 1 TABLET (5 MG TOTAL) BY MOUTH DAILY.   [DISCONTINUED] SUMAtriptan  (IMITREX ) 100 MG tablet TAKE 1 TABLET BY MOUTH AT ONSET OF MIGRAINE, REPEAT DOSE IN 2 HRS IF NEEDED, MAX 2 TABS IN 24 HRS   [DISCONTINUED] SYNTHROID  75 MCG tablet TAKE 1 TABLET BY MOUTH DAILY BEFORE BREAKFAST.   No facility-administered encounter medications on file as of 01/02/2024.    Allergies as of 01/02/2024   (No Known Allergies)  Past Medical History:  Diagnosis Date   Atrophy of thyroid  (acquired)    Autoimmune thyroiditis    Chronic fatigue, unspecified    Gastro-esophageal reflux disease without esophagitis    Generalized anxiety disorder    Menopausal and female climacteric states    Migraine without aura, not intractable, without status migrainosus    Peripheral vascular disease (HCC) 1992   blood clot with broken leg, coumadin x 6 mos   Vitamin D  deficiency, unspecified     Past Surgical History:  Procedure Laterality Date   Nova sure ablation  2012   rotator cuff surgery  12/2014   TONSILLECTOMY     as a child    Family  History  Problem Relation Age of Onset   Hypertension Mother    Hyperlipidemia Mother    Breast cancer Mother    Melanoma Mother    Migraines Mother    Diabetes type II Mother    Lung cancer Father    Hypertension Sister    Heart attack Maternal Grandmother    Heart attack Maternal Grandfather    Stroke Maternal Grandfather     Social History   Socioeconomic History   Marital status: Married    Spouse name: Jimmy Stipes   Number of children: 2   Years of education: 14   Highest education level: Associate degree: occupational, Scientist, product/process development, or vocational program  Occupational History   Occupation: Hair stylist  Tobacco Use   Smoking status: Never    Passive exposure: Past   Smokeless tobacco: Never  Vaping Use   Vaping status: Never Used  Substance and Sexual Activity   Alcohol use: Yes    Alcohol/week: 1.0 standard drink of alcohol    Types: 1 Glasses of wine per week   Drug use: Never   Sexual activity: Yes    Partners: Male    Birth control/protection: Surgical  Other Topics Concern   Not on file  Social History Narrative   ** Merged History Encounter **       Right-handedness One-story home   Social Drivers of Health   Financial Resource Strain: Low Risk  (04/06/2023)   Overall Financial Resource Strain (CARDIA)    Difficulty of Paying Living Expenses: Not hard at all  Food Insecurity: No Food Insecurity (04/06/2023)   Hunger Vital Sign    Worried About Running Out of Food in the Last Year: Never true    Ran Out of Food in the Last Year: Never true  Transportation Needs: No Transportation Needs (04/06/2023)   PRAPARE - Administrator, Civil Service (Medical): No    Lack of Transportation (Non-Medical): No  Physical Activity: Inactive (04/06/2023)   Exercise Vital Sign    Days of Exercise per Week: 0 days    Minutes of Exercise per Session: 0 min  Stress: No Stress Concern Present (04/06/2023)   [employer] of Occupational Health -  Occupational Stress Questionnaire    Feeling of Stress : Not at all  Social Connections: Unknown (04/06/2023)   Social Connection and Isolation Panel [NHANES]    Frequency of Communication with Friends and Family: More than three times a week    Frequency of Social Gatherings with Friends and Family: Three times a week    Attends Religious Services: Patient declined    Active Member of Clubs or Organizations: No    Attends Banker Meetings: Never    Marital Status: Married  Catering manager Violence: Not At Risk (04/06/2023)  Humiliation, Afraid, Rape, and Kick questionnaire    Fear of Current or Ex-Partner: No    Emotionally Abused: No    Physically Abused: No    Sexually Abused: No    Review of Systems  Constitutional:  Negative for fatigue.  Respiratory:  Positive for shortness of breath.     Vitals:   01/02/24 1457  BP: 108/60  Pulse: 80  SpO2: 98%    Physical Exam Constitutional:      Appearance: Normal appearance.  HENT:     Head: Normocephalic.     Mouth/Throat:     Mouth: Mucous membranes are moist.  Eyes:     General: No scleral icterus. Cardiovascular:     Rate and Rhythm: Normal rate and regular rhythm.     Heart sounds: No murmur heard. Pulmonary:     Effort: No respiratory distress.     Breath sounds: No stridor. No wheezing or rhonchi.  Musculoskeletal:     Cervical back: No rigidity or tenderness.  Neurological:     Mental Status: She is alert.  Psychiatric:        Mood and Affect: Mood normal.    Data Reviewed: CT chest was reviewed by myself-discussed with the patient-10/03/2023  PFT shows minimal obstruction with minimal reduction in FEV1 but FEV1 of FVC is normal  Assessment:  History of DVT/PE Recent CT scan showing thrombus in right lower lobe, thrombosed aneurysmal dilatation  Shortness of breath - Intermittent use of albuterol   Pulmonary embolus - Remains on Eliquis  and tolerating it well  No significant large  airway obstruction  Plan/Recommendations:  Continue albuterol  use just as needed  Continue anticoagulation  No further testing needed at present  Graded activities as tolerated  Follow-up in about 6 months  If stable at that point, may be seen as needed   Myer Artis MD [facility] 01/02/2024, 3:24 PM  CC: Cyndi Drain, PA-C

## 2024-01-02 NOTE — Progress Notes (Signed)
 Full pft performed today.

## 2024-01-13 ENCOUNTER — Other Ambulatory Visit: Payer: Self-pay | Admitting: Physician Assistant

## 2024-01-13 DIAGNOSIS — R7989 Other specified abnormal findings of blood chemistry: Secondary | ICD-10-CM

## 2024-01-16 ENCOUNTER — Ambulatory Visit: Payer: 59 | Admitting: Physician Assistant

## 2024-01-18 ENCOUNTER — Ambulatory Visit: Admitting: Physician Assistant

## 2024-01-24 ENCOUNTER — Ambulatory Visit: Admitting: Physician Assistant

## 2024-01-25 ENCOUNTER — Ambulatory Visit: Admitting: Physician Assistant

## 2024-01-25 VITALS — BP 124/78 | HR 72 | Temp 97.5°F | Ht 66.0 in | Wt 186.0 lb

## 2024-01-25 DIAGNOSIS — M25511 Pain in right shoulder: Secondary | ICD-10-CM

## 2024-01-25 DIAGNOSIS — E559 Vitamin D deficiency, unspecified: Secondary | ICD-10-CM | POA: Diagnosis not present

## 2024-01-25 DIAGNOSIS — Z86711 Personal history of pulmonary embolism: Secondary | ICD-10-CM

## 2024-01-25 DIAGNOSIS — F419 Anxiety disorder, unspecified: Secondary | ICD-10-CM | POA: Insufficient documentation

## 2024-01-25 DIAGNOSIS — G8929 Other chronic pain: Secondary | ICD-10-CM | POA: Insufficient documentation

## 2024-01-25 DIAGNOSIS — M792 Neuralgia and neuritis, unspecified: Secondary | ICD-10-CM

## 2024-01-25 DIAGNOSIS — E039 Hypothyroidism, unspecified: Secondary | ICD-10-CM

## 2024-01-25 DIAGNOSIS — R7303 Prediabetes: Secondary | ICD-10-CM | POA: Insufficient documentation

## 2024-01-25 DIAGNOSIS — E782 Mixed hyperlipidemia: Secondary | ICD-10-CM | POA: Diagnosis not present

## 2024-01-25 NOTE — Progress Notes (Signed)
 Subjective:  Patient ID: [name], female    DOB: [birth year]  Age: 58 y.o. MRN: [id number]  Chief Complaint  Patient presents with   Medical Management of Chronic Issues    Hyperlipidemia  Anxiety      Pt with hypothyroidism - currently on synthroid  75 mcg and follows with endocrinology Dr Dessie Flow - voices no problems or concerns  Pt with anxiety -  currently stable on medication klonopin  and lexapro  20mg  - she has not had any breakthrough symptoms recently  Pt with  vit D deficiency - pt is due for repeat labwork - was advised with last labwork to start daily otc supplement  Pt with hyperlipidemia - does watch diet - pt states she is taking crestor  5mg  qd and watching diet - due for labwork  Pt is being followed by specialist for chronic foot pain/neuralgia - she is taking gabapentin  for pain which is being managed at this time  Pt with migraines - takes topamax  and imitrex  - states had a lot of stress last month and had more headaches at that time - currently not having issues  Pt with history of PE - does see pulmonologist and states overall her breathing is doing better - still on Eliquis   Pt complains of right shoulder pain.  In 2016 she had rotator cuff surgery on right shoulder and then a year ago she fell onto that shoulder.  Has had intermittent pain since that time and will be contacting her ortho office for a follow up appt Current Outpatient Medications on File Prior to Visit  Medication Sig Dispense Refill   albuterol  (VENTOLIN  HFA) 108 (90 Base) MCG/ACT inhaler Inhale 2 puffs into the lungs every 6 (six) hours as needed for wheezing or shortness of breath. 8 g 2   clonazePAM  (KLONOPIN ) 0.5 MG tablet TAKE 1 TABLET BY MOUTH EVERYDAY AT BEDTIME 30 tablet 0   ELIQUIS  5 MG TABS tablet TAKE 2 TABLETS (10MG ) TWICE DAILY FOR 7 DAYS, THEN 1 TABLET (5MG ) TWICE DAILY 60 tablet 3   escitalopram  (LEXAPRO ) 20 MG tablet TAKE 1 TABLET BY MOUTH EVERY DAY 90 tablet 1    famotidine  (PEPCID ) 40 MG tablet Take 40 mg by mouth daily as needed for heartburn or indigestion.     fexofenadine (ALLEGRA) 180 MG tablet Take 180 mg by mouth daily.     gabapentin  (NEURONTIN ) 300 MG capsule Take 1 capsule (300 mg total) by mouth 2 (two) times daily. 60 capsule 3   ipratropium-albuterol  (DUONEB) 0.5-2.5 (3) MG/3ML SOLN Take 3 mLs by nebulization every 4 (four) hours as needed. 360 mL 0   polyethylene glycol (MIRALAX / GLYCOLAX) 17 g packet Take 17 g by mouth daily.     rosuvastatin  (CRESTOR ) 5 MG tablet TAKE 1 TABLET (5 MG TOTAL) BY MOUTH DAILY. 90 tablet 0   SUMAtriptan  (IMITREX ) 100 MG tablet Take 1 tablet (100 mg total) by mouth every 2 (two) hours as needed for migraine. May repeat in 2 hours if headache persists or recurs. 9 tablet 2   SYNTHROID  75 MCG tablet Take 1 tablet (75 mcg total) by mouth daily before breakfast. 90 tablet 3   topiramate  (TOPAMAX ) 25 MG tablet Take 1 tablet (25 mg total) by mouth in the morning, at noon, in the evening, and at bedtime. Instead take 1 in the morning and 3 at night 180 tablet 1   No current facility-administered medications on file prior to visit.   Past Medical History:  Diagnosis Date  Atrophy of thyroid  (acquired)    Autoimmune thyroiditis    Chronic fatigue, unspecified    Gastro-esophageal reflux disease without esophagitis    Generalized anxiety disorder    Menopausal and female climacteric states    Migraine without aura, not intractable, without status migrainosus    Peripheral vascular disease (HCC) 1992   blood clot with broken leg, coumadin x 6 mos   Vitamin D  deficiency, unspecified    Past Surgical History:  Procedure Laterality Date   Nova sure ablation  2012   rotator cuff surgery  12/2014   TONSILLECTOMY     as a child    Family History  Problem Relation Age of Onset   Hypertension Mother    Hyperlipidemia Mother    Breast cancer Mother    Melanoma Mother    Migraines Mother    Diabetes type II  Mother    Lung cancer Father    Hypertension Sister    Heart attack Maternal Grandmother    Heart attack Maternal Grandfather    Stroke Maternal Grandfather    Social History   Socioeconomic History   Marital status: Married    Spouse name: Jinan Biggins   Number of children: 2   Years of education: 14   Highest education level: 12th grade  Occupational History   Occupation: Hair stylist  Tobacco Use   Smoking status: Never    Passive exposure: Past   Smokeless tobacco: Never  Vaping Use   Vaping status: Never Used  Substance and Sexual Activity   Alcohol use: Yes    Alcohol/week: 1.0 standard drink of alcohol    Types: 1 Glasses of wine per week   Drug use: Never   Sexual activity: Yes    Partners: Male    Birth control/protection: Surgical  Other Topics Concern   Not on file  Social History Narrative   ** Merged History Encounter **       Right-handedness One-story home   Social Drivers of Health   Financial Resource Strain: Low Risk  (01/25/2024)   Overall Financial Resource Strain (CARDIA)    Difficulty of Paying Living Expenses: Not very hard  Food Insecurity: No Food Insecurity (01/25/2024)   Hunger Vital Sign    Worried About Running Out of Food in the Last Year: Never true    Ran Out of Food in the Last Year: Never true  Transportation Needs: No Transportation Needs (01/25/2024)   PRAPARE - Administrator, Civil Service (Medical): No    Lack of Transportation (Non-Medical): No  Physical Activity: Unknown (01/25/2024)   Exercise Vital Sign    Days of Exercise per Week: Patient declined    Minutes of Exercise per Session: 0 min  Stress: No Stress Concern Present (01/25/2024)   [employer] of Occupational Health - Occupational Stress Questionnaire    Feeling of Stress : Only a little  Social Connections: Socially Integrated (01/25/2024)   Social Connection and Isolation Panel [NHANES]    Frequency of Communication with Friends and Family:  Three times a week    Frequency of Social Gatherings with Friends and Family: Three times a week    Attends Religious Services: More than 4 times per year    Active Member of Clubs or Organizations: Yes    Attends Banker Meetings: More than 4 times per year    Marital Status: Married    CONSTITUTIONAL: Negative for chills, fatigue, fever, unintentional weight gain and unintentional weight loss.  E/N/T:  Negative for ear pain, nasal congestion and sore throat.  CARDIOVASCULAR: Negative for chest pain, dizziness, palpitations and pedal edema.  RESPIRATORY: Negative for recent cough and dyspnea.  GASTROINTESTINAL: Negative for abdominal pain, acid reflux symptoms, constipation, diarrhea, nausea and vomiting.  MSK: see HPI INTEGUMENTARY: Negative for rash.  NEUROLOGICAL: Negative for dizziness and headaches.  PSYCHIATRIC: Negative for sleep disturbance and to question depression screen.  Negative for depression, negative for anhedonia.       Objective:  PHYSICAL EXAM:   VS: BP 124/78   Pulse 72   Temp (!) 97.5 F (36.4 C)   Ht 5' 6 (1.676 m)   Wt 186 lb (84.4 kg)   SpO2 98%   BMI 30.02 kg/m   GEN: Well nourished, well developed, in no acute distress   Cardiac: RRR; no murmurs, rubs, Respiratory:  normal respiratory rate and pattern with no distress - normal breath sounds with no rales, rhonchi, wheezes or rubs  MS: no deformity or atrophy - decreased rom of right shoulder Skin: warm and dry, no rash  Neuro:  Alert and Oriented x 3,  - CN II-Xii grossly intact Psych: euthymic mood, appropriate affect and demeanor  Lab Results  Component Value Date   WBC 7.6 10/11/2023   HGB 13.3 10/11/2023   HCT 39.8 10/11/2023   PLT 274 10/11/2023   GLUCOSE 82 10/11/2023   CHOL 160 10/11/2023   TRIG 185 (H) 10/11/2023   HDL 54 10/11/2023   LDLCALC 75 10/11/2023   ALT 15 10/11/2023   AST 19 10/11/2023   NA 141 10/11/2023   K 4.4 10/11/2023   CL 104 10/11/2023    CREATININE 0.68 10/11/2023   BUN 14 10/11/2023   CO2 24 10/11/2023   TSH 0.766 10/11/2023   HGBA1C 5.7 (H) 10/11/2023      Assessment & Plan:   Problem List Items Addressed This Visit       Endocrine   Acquired hypothyroidism Continue follow up with endocrinologist Continue syntrhoid 75mcg qd Labwork pending     Other   Vitamin D  insufficiency   Relevant Orders   VITAMIN D  25 Hydroxy (Vit-D Deficiency, Fractures)    Other Visit Diagnoses     Neuralgia    -  Primary   Relevant Orders   CBC with Differential/Platelet   Comprehensive metabolic panel Continue gabapentin  Follow up with specialist as directed   Anxiety     Continue current meds   Mixed hyperlipidemia       Relevant Orders   CBC with Differential/Platelet   Comprehensive metabolic panel   Lipid panel Watch diet  Prediabetes Continue to watch diet  History of PE  Continue Eliquis  5mg  1 po bid Follow up with pulmonology as directed  Chronic right shoulder pain Follow up with ortho     .  No orders of the defined types were placed in this encounter.   No orders of the defined types were placed in this encounter.    Follow-up: No follow-ups on file.  An After Visit Summary was printed and given to the patient.  Anthonette Bastos [facility] [phone]

## 2024-01-26 ENCOUNTER — Ambulatory Visit: Payer: Self-pay | Admitting: Physician Assistant

## 2024-01-26 ENCOUNTER — Encounter: Payer: Self-pay | Admitting: Internal Medicine

## 2024-01-26 LAB — TSH: TSH: 0.282 u[IU]/mL — ABNORMAL LOW (ref 0.450–4.500)

## 2024-01-26 LAB — COMPREHENSIVE METABOLIC PANEL WITH GFR
ALT: 18 IU/L (ref 0–32)
AST: 22 IU/L (ref 0–40)
Albumin: 4.4 g/dL (ref 3.8–4.9)
Alkaline Phosphatase: 74 IU/L (ref 44–121)
BUN/Creatinine Ratio: 14 (ref 9–23)
BUN: 9 mg/dL (ref 6–24)
Bilirubin Total: 0.5 mg/dL (ref 0.0–1.2)
CO2: 21 mmol/L (ref 20–29)
Calcium: 9.4 mg/dL (ref 8.7–10.2)
Chloride: 106 mmol/L (ref 96–106)
Creatinine, Ser: 0.64 mg/dL (ref 0.57–1.00)
Globulin, Total: 2.1 g/dL (ref 1.5–4.5)
Glucose: 86 mg/dL (ref 70–99)
Potassium: 4.2 mmol/L (ref 3.5–5.2)
Sodium: 141 mmol/L (ref 134–144)
Total Protein: 6.5 g/dL (ref 6.0–8.5)
eGFR: 103 mL/min/{1.73_m2} (ref >59)

## 2024-01-26 LAB — LIPID PANEL
Chol/HDL Ratio: 3.3 ratio (ref 0.0–4.4)
Cholesterol, Total: 162 mg/dL (ref 100–199)
HDL: 49 mg/dL (ref >39)
LDL Chol Calc (NIH): 91 mg/dL (ref 0–99)
Triglycerides: 123 mg/dL (ref 0–149)
VLDL Cholesterol Cal: 22 mg/dL (ref 5–40)

## 2024-01-26 LAB — CBC WITH DIFFERENTIAL/PLATELET
Basophils Absolute: 0.1 10*3/uL (ref 0.0–0.2)
Basos: 1 %
EOS (ABSOLUTE): 0.1 10*3/uL (ref 0.0–0.4)
Eos: 2 %
Hematocrit: 40.1 % (ref 34.0–46.6)
Hemoglobin: 12.9 g/dL (ref 11.1–15.9)
Immature Grans (Abs): 0 10*3/uL (ref 0.0–0.1)
Immature Granulocytes: 0 %
Lymphocytes Absolute: 2.4 10*3/uL (ref 0.7–3.1)
Lymphs: 37 %
MCH: 30.5 pg (ref 26.6–33.0)
MCHC: 32.2 g/dL (ref 31.5–35.7)
MCV: 95 fL (ref 79–97)
Monocytes Absolute: 0.6 10*3/uL (ref 0.1–0.9)
Monocytes: 9 %
Neutrophils Absolute: 3.3 10*3/uL (ref 1.4–7.0)
Neutrophils: 51 %
Platelets: 251 10*3/uL (ref 150–450)
RBC: 4.23 x10E6/uL (ref 3.77–5.28)
RDW: 13.1 % (ref 11.7–15.4)
WBC: 6.5 10*3/uL (ref 3.4–10.8)

## 2024-01-26 LAB — HEMOGLOBIN A1C
Est. average glucose Bld gHb Est-mCnc: 111 mg/dL
Hgb A1c MFr Bld: 5.5 % (ref 4.8–5.6)

## 2024-01-26 LAB — VITAMIN D 25 HYDROXY (VIT D DEFICIENCY, FRACTURES): Vit D, 25-Hydroxy: 41.1 ng/mL (ref 30.0–100.0)

## 2024-01-27 ENCOUNTER — Other Ambulatory Visit: Payer: Self-pay | Admitting: Internal Medicine

## 2024-01-27 DIAGNOSIS — E063 Autoimmune thyroiditis: Secondary | ICD-10-CM

## 2024-02-01 ENCOUNTER — Other Ambulatory Visit: Payer: Self-pay | Admitting: Physician Assistant

## 2024-02-01 DIAGNOSIS — E039 Hypothyroidism, unspecified: Secondary | ICD-10-CM

## 2024-02-12 ENCOUNTER — Other Ambulatory Visit: Payer: Self-pay | Admitting: Family Medicine

## 2024-02-28 ENCOUNTER — Other Ambulatory Visit

## 2024-02-28 ENCOUNTER — Other Ambulatory Visit: Payer: Self-pay | Admitting: Physician Assistant

## 2024-02-28 ENCOUNTER — Encounter: Payer: Self-pay | Admitting: Podiatry

## 2024-02-28 ENCOUNTER — Ambulatory Visit: Admitting: Podiatry

## 2024-02-28 DIAGNOSIS — M722 Plantar fascial fibromatosis: Secondary | ICD-10-CM

## 2024-02-28 DIAGNOSIS — E039 Hypothyroidism, unspecified: Secondary | ICD-10-CM

## 2024-02-28 DIAGNOSIS — G5793 Unspecified mononeuropathy of bilateral lower limbs: Secondary | ICD-10-CM | POA: Diagnosis not present

## 2024-02-28 MED ORDER — TRIAMCINOLONE ACETONIDE 40 MG/ML IJ SUSP
20.0000 mg | Freq: Once | INTRAMUSCULAR | Status: AC
  Administered 2024-02-28: 20 mg

## 2024-02-28 NOTE — Progress Notes (Signed)
 She presents today for continued issues with her neuropathy.  States that she sees a neurologist in [place] and also sees a doctor with the pain clinic.  States that the doctor with the pain clinic started her on Topamax  and decreased her gabapentin  to 300 mg twice a day which she feels has really set her back.  She also states that the neurologist is only seeing her once a year and still feels that she is not having any nerve issues neuropathy etc.  She does have severe pain and numbness in her feet which is also starting to radiate up her legs and she also notices it in her fingers and hands as well.  She states that she is getting to the point where she is starting to fall she is noticing more right sided weakness along her entire right side but she states that the pain is the worst and she feels that if she were not in so much pain maybe she could exercise more and maintain general strength.  She is also complaining of pain in her right heel today.  Objective: Vital signs are stable alert oriented x 3 pulses are palpable.  She has a smaller muscle size on her right leg as opposed to her left leg she has pain on palpation medial calcaneal tubercle.  She still has epicritic sensation positional sensation and light touch.  This is one of the reasons that her neurologist feels that she does not have neuropathy even though we did a epidermal nerve fiber density test which was positive showing that she had a decreased density.  She has pain on palpation medial calcaneal tubercle and some tenderness on palpation of the posterior tibial tendon with fluctuance in the tendon sheath right side only.  Assessment: Plan fasciitis and some posterior tibial tendinitis right.  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  Plan: I injected the right heel today 20 mg Kenalog  5 mg Marcaine .  I am going to ask if she can be seen by Dr. Eichman for  possible evaluation and treatment.  According to neurology everything that she has is idiopathic there is no physiological biological findings to justify her issues.  I feel that with all of the steps that she has taken that she may possibly benefit at least in the lower extremity from implantable spinal stem products.

## 2024-02-29 ENCOUNTER — Encounter: Payer: Self-pay | Admitting: Internal Medicine

## 2024-02-29 ENCOUNTER — Ambulatory Visit: Payer: Self-pay | Admitting: Physician Assistant

## 2024-02-29 ENCOUNTER — Other Ambulatory Visit: Payer: Self-pay | Admitting: Physician Assistant

## 2024-02-29 DIAGNOSIS — E039 Hypothyroidism, unspecified: Secondary | ICD-10-CM

## 2024-02-29 LAB — TSH: TSH: 0.385 u[IU]/mL — ABNORMAL LOW (ref 0.450–4.500)

## 2024-02-29 MED ORDER — LEVOTHYROXINE SODIUM 50 MCG PO TABS: 50.0000 ug | ORAL_TABLET | Freq: Every day | ORAL | 3 refills | Status: DC

## 2024-02-29 NOTE — Telephone Encounter (Signed)
 Please call pharmacy and notify name brand only

## 2024-02-29 NOTE — Telephone Encounter (Signed)
 Spoke with pharmacy, the ran it as brand only and is working on rx now.

## 2024-03-01 NOTE — Addendum Note (Signed)
 Addended by: ELAYNE ROSINA BRAVO on: 03/01/2024 08:26 AM   Modules accepted: Orders

## 2024-03-08 ENCOUNTER — Ambulatory Visit: Admitting: Podiatry

## 2024-03-15 ENCOUNTER — Ambulatory Visit: Admitting: Podiatry

## 2024-03-24 ENCOUNTER — Other Ambulatory Visit: Payer: Self-pay | Admitting: Physician Assistant

## 2024-03-25 ENCOUNTER — Other Ambulatory Visit: Payer: Self-pay | Admitting: Physician Assistant

## 2024-03-25 DIAGNOSIS — E782 Mixed hyperlipidemia: Secondary | ICD-10-CM

## 2024-04-03 ENCOUNTER — Encounter: Attending: Physical Medicine and Rehabilitation | Admitting: Physical Medicine and Rehabilitation

## 2024-04-03 ENCOUNTER — Encounter: Payer: Self-pay | Admitting: Physical Medicine and Rehabilitation

## 2024-04-03 VITALS — BP 132/83 | HR 61 | Ht <= 58 in | Wt 191.0 lb

## 2024-04-03 DIAGNOSIS — G43809 Other migraine, not intractable, without status migrainosus: Secondary | ICD-10-CM | POA: Insufficient documentation

## 2024-04-03 DIAGNOSIS — M792 Neuralgia and neuritis, unspecified: Secondary | ICD-10-CM | POA: Diagnosis present

## 2024-04-03 MED ORDER — NURTEC 75 MG PO TBDP: 1.0000 | ORAL_TABLET | ORAL | 3 refills | Status: AC

## 2024-04-03 NOTE — Patient Instructions (Addendum)
 Avoid gluten dairy eggs  Eat a lot of fruits and vegetables  Foods that may reduce pain: 1) Ginger (especially studied for arthritis)- reduce leukotriene production to decrease inflammation 2) Blueberries- high in phytonutrients that decrease inflammation 3) Salmon- marine omega-3s reduce joint swelling and pain 4) Pumpkin seeds- reduce inflammation 5) B12 supplement 6) turmeric- reduces inflammation 7) tart cherries - reduce pain and stiffness 8) extra virgin olive oil - its compound olecanthal helps to block prostaglandins  9) chili peppers- can be eaten or applied topically via capsaicin  10) mint- helpful for headache, muscle aches, joint pain, and itching 11) garlic- reduces inflammation 12) Green tea- reduces inflammation and oxidative stress, helps with weight loss, may reduce the risk of cancer, recommend Double Green Matcha [place] of Tea daily  Link to further information on diet for chronic pain: [email or web address]

## 2024-04-03 NOTE — Progress Notes (Addendum)
 Subjective:    Patient ID: [name], female    DOB: [birth year], 58 y.o.   MRN: [id number]  HPI [name] is a 58 year year old woman presenting for follow-up of peripheral neuropathy.   1) Peripheral neuropathy -average pain is 6/10 -present in hands now as well -pain has been stable -her podiatrist recommended stopping the topamax  because he said this could contribute to her numbness and tingling in her hands -she has not seen any negative side effects to the topamax  -had an allergic reaction to Qutenza  -pain has been stable -at some times it is worse than others.  -metformin  helps -has been rough month with the feet burning and aching -she has been rubbing lotion on the feet- blue emu oil, has been mixing with lidocaine  -started back hurting in her toes and ankles -has an appointment with Dr. Santo neurology as it is moving into her fingers -has been present for a year -she is reading my pain journal book and she loves blueberries and cherries -she has never been diagnosed with diabetes -pain is present in forefoot and top of toes -the Qutenza  hurt very badly last time so does not want to repeat -toes are constantly tingling -she feels sharp pains like knives -she has tried the TENS unit- this does help ease it. -she takes an extra gabapentin  at night if the pain is really bad  2) Overweight -she has tried Weight Watchers and lost 21 bs but this did not help her pain Current BMI is 28.25 and weight is 169 -has lost weight due to the topamax   3) Plantar fasciitis: -she was diagnosed with plantar fasciitis and this was treated.   4) Prediabetes -discussed HgbA1c with her, she has followup with PCP -her PCP was unhappy that she was started on metformin  since her hemoglobin A1c  5) Insomnia:  -currently topamax  BID helping -her sleep fluctuates  6) Migraines: -worse with the weather -has never had food allergy blood test  7) Fall: -had a hard  fall Pain Inventory Average Pain 8 Pain Right Now 58 My pain is constant, sharp, burning, stabbing, tingling, and aching  In the last 24 hours, has pain interfered with the following? General activity 7 Relation with others 0 Enjoyment of life 4 What TIME of day is your pain at its worst? morning  and night Sleep (in general) Fair  Pain is worse with: walking, inactivity, and standing Pain improves with: TENS and light Relief from Meds: 5     Family History  Problem Relation Age of Onset   Hypertension Mother    Hyperlipidemia Mother    Breast cancer Mother    Melanoma Mother    Migraines Mother    Diabetes type II Mother    Lung cancer Father    Hypertension Sister    Heart attack Maternal Grandmother    Heart attack Maternal Grandfather    Stroke Maternal Grandfather    Social History   Socioeconomic History   Marital status: Married    Spouse name: Cyanna Neace   Number of children: 2   Years of education: 14   Highest education level: 12th grade  Occupational History   Occupation: Hair stylist  Tobacco Use   Smoking status: Never    Passive exposure: Past   Smokeless tobacco: Never  Vaping Use   Vaping status: Never Used  Substance and Sexual Activity   Alcohol use: Yes    Alcohol/week: 1.0 standard drink of alcohol  Types: 1 Glasses of wine per week   Drug use: Never   Sexual activity: Yes    Partners: Male    Birth control/protection: Surgical  Other Topics Concern   Not on file  Social History Narrative   ** Merged History Encounter **       Right-handedness One-story home   Social Drivers of Health   Financial Resource Strain: Low Risk  (01/25/2024)   Overall Financial Resource Strain (CARDIA)    Difficulty of Paying Living Expenses: Not very hard  Food Insecurity: No Food Insecurity (01/25/2024)   Hunger Vital Sign    Worried About Running Out of Food in the Last Year: Never true    Ran Out of Food in the Last Year: Never true   Transportation Needs: No Transportation Needs (01/25/2024)   PRAPARE - Administrator, Civil Service (Medical): No    Lack of Transportation (Non-Medical): No  Physical Activity: Unknown (01/25/2024)   Exercise Vital Sign    Days of Exercise per Week: Patient declined    Minutes of Exercise per Session: 0 min  Stress: No Stress Concern Present (01/25/2024)   [employer] of Occupational Health - Occupational Stress Questionnaire    Feeling of Stress : Only a little  Social Connections: Socially Integrated (01/25/2024)   Social Connection and Isolation Panel    Frequency of Communication with Friends and Family: Three times a week    Frequency of Social Gatherings with Friends and Family: Three times a week    Attends Religious Services: More than 4 times per year    Active Member of Clubs or Organizations: Yes    Attends Banker Meetings: More than 4 times per year    Marital Status: Married   Past Surgical History:  Procedure Laterality Date   Bettye sure ablation  2012   rotator cuff surgery  12/2014   TONSILLECTOMY     as a child   Past Medical History:  Diagnosis Date   Atrophy of thyroid  (acquired)    Autoimmune thyroiditis    Chronic fatigue, unspecified    Gastro-esophageal reflux disease without esophagitis    Generalized anxiety disorder    Menopausal and female climacteric states    Migraine without aura, not intractable, without status migrainosus    Peripheral vascular disease (HCC) 1992   blood clot with broken leg, coumadin x 6 mos   Vitamin D  deficiency, unspecified    Ht 1' (0.305 m)   Wt 191 lb (86.6 kg)   BMI 932.55 kg/m   Opioid Risk Score:   Fall Risk Score:  `1  Depression screen [facility] 2/9     04/03/2024    9:20 AM 01/25/2024    1:53 PM 12/27/2023    9:23 AM 10/11/2023    8:52 AM 09/27/2023   11:27 AM 04/06/2023    9:03 AM 02/21/2023    9:30 AM  Depression screen PHQ 2/9  Decreased Interest 0 0 0 0 0 0 0  Down,  Depressed, Hopeless 0 0 0 0 0 0 0  PHQ - 2 Score 0 0 0 0 0 0 0  Altered sleeping    3  2   Tired, decreased energy    0  2   Change in appetite    0  2   Feeling bad or failure about yourself     0  0   Trouble concentrating    0  2   Moving slowly or fidgety/restless  0  0   Suicidal thoughts    0  0   PHQ-9 Score    3  8   Difficult doing work/chores    Not difficult at all  Not difficult at all      Review of Systems  Musculoskeletal:  Positive for gait problem.       Bilateral feet and hands  Neurological:  Positive for numbness and headaches.       Tingling  All other systems reviewed and are negative.      Objective:   Physical Exam Gen: no distress, normal appearing, in dark room due to light sensitivity HEENT: oral mucosa pink and moist, NCAT Cardio: Reg rate Chest: normal effort, normal rate of breathing Abd: soft, non-distended Ext: no edema Psych: pleasant, normal affect Skin: intact Neuro: Alert and oriented x3, sensation intact in feet is intact    Assessment & Plan:  1) Peripheral neuropathy -will defer Qutenza  Continue gabapentin  -discussed benefits of red light therapy  -discussed that Journavx is a highly selective inhibitor for Nav 1.8, which is specific for pain in the peripheral nervous system, discussed that lidocaine  in contrast affects all Nav receptors, discussed loading dose is 2 50mg  tablets and this can be taken 1 hour before food, discussed that then 50mg  can be taken with out without food q12H until pain is tolerable. Discussed that potential side effects are pruritus, muscle spasms, increase blood creatinine, rash. Discussed that we have samples available and we have copay cards available. Discussed that the 2 phase 3 clinicial trials sent to the FDA were for abdominoplasty and bunionectomy, discussed that it has been currently studied for 14 days, discussed that it can reduce efficacy of opioids and benzodiazepines, discussed that it has not be  studied in severe hepatic impairment. Discussed that it is contraindicated with fluconazole. Discussed that it can interfere with hormonal contraception up to 28 days after use. Discussed that it can decrease fertility, it has not been studied in pregnancy, that it has been present in animal milk during lactation, not recommended for GFR <15, discussed that outpatient if the medication requires a prior auth the copay should be $30 for at least a 60 day supply. The medication may be more likely to be in stock in [facility] and [facility]. We do have samples available   -methylated B vitamin can help, discussed this -will defer metformin  as her other physician does not want her on this -hgbA1c reviewed and is 5.8, due later this month for a recheck -discussed that she tried berberine -f/u with neurology -change topamax  to 2 tabs in the morning and 2 at night -discussed that pain is worst at night -Provided with a pain relief journal and discussed that it contains foods and lifestyle tips to naturally help to improve pain. Discussed that these lifestyle strategies are also very good for health unlike some medications which can have negative side effects. Discussed that the act of keeping a journal can be therapeutic and helpful to realize patterns what helps to trigger and alleviate pain.   -Discussed current symptoms of pain and history of pain.  -Discussed benefits of exercise in reducing pain. -Discussed following foods that may reduce pain: 1) Ginger (especially studied for arthritis)- reduce leukotriene production to decrease inflammation 2) Blueberries- high in phytonutrients that decrease inflammation 3) Salmon- marine omega-3s reduce joint swelling and pain 4) Pumpkin seeds- reduce inflammation 5) dark chocolate- reduces inflammation 6) turmeric- reduces inflammation 7) tart cherries - reduce pain and stiffness  8) extra virgin olive oil - its compound olecanthal helps to block prostaglandins  9)  chili peppers- can be eaten or applied topically via capsaicin  10) mint- helpful for headache, muscle aches, joint pain, and itching 11) garlic- reduces inflammation  Link to further information on diet for chronic pain: [email or web address]   2) Overweight: -Discussed the benefits of intermittent fasting. Recommended starting with pushing dinner 15 minutes earlier and when this feels easy, continuing to push dinner 15 minutes earlier. Discussed that this can help her body to improve its ability to burn fat rather than glucose, improving insulin sensitivity. Recommended drinking [place] tea in the evening to help curb appetite and for its numerous health benefits.   Commended on 9 lb weight loss! Discussed current weight is 165 lbs.  -prescribed topamax , increase to TID -discussed that weight has been stable -discussed that the weight watchers clinic was very helpful but the online app is not as helpful. -discussed her recent weight gain -discussed that she has not been able to exercise  3) Prediabetes: -d/c metformin   -discussed berberine 500mg  TID -continue fruits and vegetables -repeat HgbA1c reviewed and shows elevation to 5.8.  -discussed discussing with endocrinologists -Discussed the benefits of intermittent fasting. Recommended starting with pushing dinner 15 minutes earlier and when this feels easy, continuing to push dinner 15 minutes earlier. Discussed that this can help her body to improve its ability to burn fat rather than glucose, improving insulin sensitivity. Recommended drinking [place] tea in the evening to help curb appetite and for its numerous health benefits.   -discussed benefits of continuous blood glucose monitoring.   4) Fall -discussed that recent fall was due to balance issue  5) Impaired balance: -discussed that the gabapentin  and topamax  can worsen the balance  6) Shingles in  mouth: -discussed that this occurred after a dental procedure -discussed that she had a root canal and it didn't take and she needed her tooth pulled and an implant placed -discussed that the titanium implant she felt irritated her -discussed that she developed shingles in her tongue -discussed that her gabapentin  was increased and she was prescribed mouthwash -discussed that it took weeks to heal -discussed that it was a big blow to her to have the tooth removed  7) Hypothyroidism: -continue Synthroid   8) Insomnia:  -continue melatonin  9) Migraines: -provided Nurtec samples- gave her 4 units/8 tablets of Nurtec LOT [id number] A / exp 7/28, educated on medication -prescribed nurtec -discussed these may have worsened since topamax  was stopped -recommended avoiding gluten, dairy, eggs -recommended eating a lot of fruits and vegetables  -Discussed current symptoms of pain and history of pain.  -Discussed benefits of exercise in reducing pain. -Discussed following foods that may reduce pain: 1) Ginger (especially studied for arthritis)- reduce leukotriene production to decrease inflammation 2) Blueberries- high in phytonutrients that decrease inflammation 3) Salmon- marine omega-3s reduce joint swelling and pain 4) Pumpkin seeds- reduce inflammation 5) dark chocolate- reduces inflammation 6) turmeric- reduces inflammation 7) tart cherries - reduce pain and stiffness 8) extra virgin olive oil - its compound olecanthal helps to block prostaglandins  9) chili peppers- can be eaten or applied topically via capsaicin  10) mint- helpful for headache, muscle aches, joint pain, and itching 11) garlic- reduces inflammation 12) Green tea- reduces inflammation and oxidative stress, helps with weight loss, may reduce the risk of cancer, recommend Double Green Matcha [place] of Tea daily  Link to further information on diet for chronic pain:  [email or web address].[email or web address]

## 2024-04-22 ENCOUNTER — Other Ambulatory Visit: Payer: Self-pay | Admitting: Physical Medicine and Rehabilitation

## 2024-04-24 ENCOUNTER — Ambulatory Visit: Admitting: Podiatry

## 2024-04-27 MED ORDER — NURTEC 75 MG PO TBDP: 1.0000 | ORAL_TABLET | Freq: Every day | ORAL | Status: DC | PRN

## 2024-04-27 NOTE — Addendum Note (Signed)
 Addended by: LORILEE SVEN SQUIBB on: 04/27/2024 09:41 AM   Modules accepted: Orders

## 2024-05-09 ENCOUNTER — Other Ambulatory Visit: Payer: Self-pay | Admitting: Physician Assistant

## 2024-05-09 DIAGNOSIS — R7989 Other specified abnormal findings of blood chemistry: Secondary | ICD-10-CM

## 2024-05-21 ENCOUNTER — Encounter: Payer: Self-pay | Admitting: Physician Assistant

## 2024-05-23 ENCOUNTER — Other Ambulatory Visit

## 2024-05-23 DIAGNOSIS — E039 Hypothyroidism, unspecified: Secondary | ICD-10-CM

## 2024-05-24 ENCOUNTER — Ambulatory Visit: Admitting: Podiatry

## 2024-05-24 ENCOUNTER — Ambulatory Visit: Payer: Self-pay | Admitting: Physician Assistant

## 2024-05-24 ENCOUNTER — Encounter: Payer: Self-pay | Admitting: Physician Assistant

## 2024-05-24 LAB — TSH: TSH: 9.11 u[IU]/mL — ABNORMAL HIGH (ref 0.450–4.500)

## 2024-05-28 ENCOUNTER — Other Ambulatory Visit: Payer: Self-pay | Admitting: Physician Assistant

## 2024-05-28 DIAGNOSIS — E039 Hypothyroidism, unspecified: Secondary | ICD-10-CM

## 2024-05-28 MED ORDER — LEVOTHYROXINE SODIUM 75 MCG PO TABS
75.0000 ug | ORAL_TABLET | Freq: Every day | ORAL | 3 refills | Status: AC
Start: 2024-05-28 — End: ?

## 2024-06-18 ENCOUNTER — Other Ambulatory Visit: Payer: Self-pay | Admitting: Physician Assistant

## 2024-07-04 NOTE — Progress Notes (Unsigned)
 Subjective:    Patient ID: [name], female    DOB: [birth year], 58 y.o.   MRN: [id number]  HPI [name] is a 60 year year old woman presenting for follow-up of peripheral neuropathy.   1) Peripheral neuropathy -pain has been stable -average pain is 4/10 -present in hands now as well -pain has been stable -her podiatrist recommended stopping the topamax  because he said this could contribute to her numbness and tingling in her hands -she has not seen any negative side effects to the topamax  -had an allergic reaction to Qutenza  -pain has been stable -at some times it is worse than others.  -metformin  helps -has been rough month with the feet burning and aching -she has been rubbing lotion on the feet- blue emu oil, has been mixing with lidocaine  -started back hurting in her toes and ankles -has an appointment with Dr. Santo neurology as it is moving into her fingers -has been present for a year -she is reading my pain journal book and she loves blueberries and cherries -she has never been diagnosed with diabetes -pain is present in forefoot and top of toes -the Qutenza  hurt very badly last time so does not want to repeat -toes are constantly tingling -she feels sharp pains like knives -she has tried the TENS unit- this does help ease it. -she takes an extra gabapentin  at night if the pain is really bad  2) Overweight -she has tried Weight Watchers and lost 21 bs but this did not help her pain Current BMI is 28.25 and weight is 169 -has lost weight due to the topamax   3) Plantar fasciitis: -she was diagnosed with plantar fasciitis and this was treated.   4) Prediabetes -discussed HgbA1c with her, she has followup with PCP -her PCP was unhappy that she was started on metformin  since her hemoglobin A1c  5) Insomnia:  -currently topamax  BID helping -her sleep fluctuates  6) Migraines: -worse with the weather -has never had food allergy blood test  7)  Fall: -had a hard fall  8) Anxiety: -she takes Klonopin  and this helps -used to follow with a psychologist, her New Year's resolution is to get back on his schedule Pain Inventory Average Pain 8 Pain Right Now 4 My pain is constant, sharp, burning, stabbing, tingling, and aching  In the last 24 hours, has pain interfered with the following? General activity 6 Relation with others 6 Enjoyment of life 6 What TIME of day is your pain at its worst? Morning, daytime, evening and night I feel it all the time Sleep (in general) Fair  Pain is worse with: walking, inactivity, and standing Pain improves with: TENS and light Relief from Meds: 6     Family History  Problem Relation Age of Onset   Hypertension Mother    Hyperlipidemia Mother    Breast cancer Mother    Melanoma Mother    Migraines Mother    Diabetes type II Mother    Lung cancer Father    Hypertension Sister    Heart attack Maternal Grandmother    Heart attack Maternal Grandfather    Stroke Maternal Grandfather    Social History   Socioeconomic History   Marital status: Married    Spouse name: [name]   Number of children: 2   Years of education: 14   Highest education level: 12th grade  Occupational History   Occupation: Hair stylist  Tobacco Use   Smoking status: Never    Passive exposure:  Past   Smokeless tobacco: Never  Vaping Use   Vaping status: Never Used  Substance and Sexual Activity   Alcohol use: Yes    Alcohol/week: 1.0 standard drink of alcohol    Types: 1 Glasses of wine per week   Drug use: Never   Sexual activity: Yes    Partners: Male    Birth control/protection: Surgical  Other Topics Concern   Not on file  Social History Narrative   ** Merged History Encounter **       Right-handedness One-story home   Social Drivers of Health   Financial Resource Strain: Low Risk  (01/25/2024)   Overall Financial Resource Strain (CARDIA)    Difficulty of Paying Living Expenses:  Not very hard  Food Insecurity: No Food Insecurity (01/25/2024)   Hunger Vital Sign    Worried About Running Out of Food in the Last Year: Never true    Ran Out of Food in the Last Year: Never true  Transportation Needs: No Transportation Needs (01/25/2024)   PRAPARE - Administrator, Civil Service (Medical): No    Lack of Transportation (Non-Medical): No  Physical Activity: Unknown (01/25/2024)   Exercise Vital Sign    Days of Exercise per Week: Patient declined    Minutes of Exercise per Session: 0 min  Stress: No Stress Concern Present (01/25/2024)   [employer] of Occupational Health - Occupational Stress Questionnaire    Feeling of Stress : Only a little  Social Connections: Socially Integrated (01/25/2024)   Social Connection and Isolation Panel    Frequency of Communication with Friends and Family: Three times a week    Frequency of Social Gatherings with Friends and Family: Three times a week    Attends Religious Services: More than 4 times per year    Active Member of Clubs or Organizations: Yes    Attends Banker Meetings: More than 4 times per year    Marital Status: Married   Past Surgical History:  Procedure Laterality Date   Bettye sure ablation  2012   rotator cuff surgery  12/2014   TONSILLECTOMY     as a child   Past Medical History:  Diagnosis Date   Atrophy of thyroid  (acquired)    Autoimmune thyroiditis    Chronic fatigue, unspecified    Gastro-esophageal reflux disease without esophagitis    Generalized anxiety disorder    Menopausal and female climacteric states    Migraine without aura, not intractable, without status migrainosus    Peripheral vascular disease 1992   blood clot with broken leg, coumadin x 6 mos   Vitamin D  deficiency, unspecified    BP 112/67 (BP Location: Left Arm, Patient Position: Sitting, Cuff Size: Normal)   Pulse 70   Ht 5' 6 (1.676 m)   Wt 179 lb 9.6 oz (81.5 kg)   SpO2 98%   BMI 28.99 kg/m    Opioid Risk Score:   Fall Risk Score:  `1  Depression screen [facility] 2/9     04/03/2024    9:20 AM 01/25/2024    1:53 PM 12/27/2023    9:23 AM 10/11/2023    8:52 AM 09/27/2023   11:27 AM 04/06/2023    9:03 AM 02/21/2023    9:30 AM  Depression screen PHQ 2/9  Decreased Interest 0 0 0 0 0 0 0  Down, Depressed, Hopeless 0 0 0 0 0 0 0  PHQ - 2 Score 0 0 0 0 0 0 0  Altered  sleeping    3  2   Tired, decreased energy    0  2   Change in appetite    0  2   Feeling bad or failure about yourself     0  0   Trouble concentrating    0  2   Moving slowly or fidgety/restless    0  0   Suicidal thoughts    0  0   PHQ-9 Score    3   8    Difficult doing work/chores    Not difficult at all  Not difficult at all      Data saved with a previous flowsheet row definition     Review of Systems  Musculoskeletal:  Positive for gait problem.       Bilateral feet and hands  Neurological:  Positive for numbness and headaches.       Tingling  All other systems reviewed and are negative.      Objective:   Physical Exam Gen: no distress, normal appearing, in dark room due to light sensitivity HEENT: oral mucosa pink and moist, NCAT Cardio: Reg rate Chest: normal effort, normal rate of breathing Abd: soft, non-distended Ext: no edema Psych: pleasant, normal affect Skin: intact Neuro: Alert and oriented x3, sensation intact in feet is intact, stable 11/20    Assessment & Plan:  1) Peripheral neuropathy -will defer Qutenza  Continue gabapentin  -discussed benefits of red light therapy -discussed vagal nerve stimulation -discussed that her podiatrist sent her to see a neurosurgeon -continue topamax  -recommended avoiding gluten -discussed that Crestor  could worsen neuropathy -discussed aquatherapy  -discussed that Journavx is a highly selective inhibitor for Nav 1.8, which is specific for pain in the peripheral nervous system, discussed that lidocaine  in contrast affects all Nav receptors,  discussed loading dose is 2 50mg  tablets and this can be taken 1 hour before food, discussed that then 50mg  can be taken with out without food q12H until pain is tolerable. Discussed that potential side effects are pruritus, muscle spasms, increase blood creatinine, rash. Discussed that we have samples available and we have copay cards available. Discussed that the 2 phase 3 clinicial trials sent to the FDA were for abdominoplasty and bunionectomy, discussed that it has been currently studied for 14 days, discussed that it can reduce efficacy of opioids and benzodiazepines, discussed that it has not be studied in severe hepatic impairment. Discussed that it is contraindicated with fluconazole. Discussed that it can interfere with hormonal contraception up to 28 days after use. Discussed that it can decrease fertility, it has not been studied in pregnancy, that it has been present in animal milk during lactation, not recommended for GFR <15, discussed that outpatient if the medication requires a prior auth the copay should be $30 for at least a 60 day supply. The medication may be more likely to be in stock in [facility] and [facility]. We do have samples available   -methylated B vitamin can help, discussed this -will defer metformin  as her other physician does not want her on this -hgbA1c reviewed and is 5.8, due later this month for a recheck -discussed that she tried berberine -f/u with neurology -change topamax  to 2 tabs in the morning and 2 at night -discussed that pain is worst at night -Provided with a pain relief journal and discussed that it contains foods and lifestyle tips to naturally help to improve pain. Discussed that these lifestyle strategies are also very good for health unlike some medications  which can have negative side effects. Discussed that the act of keeping a journal can be therapeutic and helpful to realize patterns what helps to trigger and alleviate pain.   -Discussed current symptoms  of pain and history of pain.  -Discussed benefits of exercise in reducing pain. -Discussed following foods that may reduce pain: 1) Ginger (especially studied for arthritis)- reduce leukotriene production to decrease inflammation 2) Blueberries- high in phytonutrients that decrease inflammation 3) Salmon- marine omega-3s reduce joint swelling and pain 4) Pumpkin seeds- reduce inflammation 5) dark chocolate- reduces inflammation 6) turmeric- reduces inflammation 7) tart cherries - reduce pain and stiffness 8) extra virgin olive oil - its compound olecanthal helps to block prostaglandins  9) chili peppers- can be eaten or applied topically via capsaicin  10) mint- helpful for headache, muscle aches, joint pain, and itching 11) garlic- reduces inflammation  Link to further information on diet for chronic pain: [email or web address]   2) Overweight: -Discussed the benefits of intermittent fasting. Recommended starting with pushing dinner 15 minutes earlier and when this feels easy, continuing to push dinner 15 minutes earlier. Discussed that this can help her body to improve its ability to burn fat rather than glucose, improving insulin sensitivity. Recommended drinking Roobois tea in the evening to help curb appetite and for its numerous health benefits.   Commended on 9 lb weight loss! Discussed current weight is 165 lbs.  -prescribed topamax , increase to TID -discussed that weight has been stable -discussed that the weight watchers clinic was very helpful but the online app is not as helpful. -discussed her recent weight gain -discussed that she has not been able to exercise  3) Prediabetes: -d/c metformin   -discussed berberine 500mg  TID -continue fruits and vegetables -repeat HgbA1c reviewed and shows elevation to 5.8.  -discussed discussing with endocrinologists -Discussed the benefits of intermittent fasting.  Recommended starting with pushing dinner 15 minutes earlier and when this feels easy, continuing to push dinner 15 minutes earlier. Discussed that this can help her body to improve its ability to burn fat rather than glucose, improving insulin sensitivity. Recommended drinking Roobois tea in the evening to help curb appetite and for its numerous health benefits.   -discussed benefits of continuous blood glucose monitoring.   4) Fall -discussed that recent fall was due to balance issue  5) Impaired balance: -discussed that the gabapentin  and topamax  can worsen the balance  6) Shingles in mouth: -discussed that this occurred after a dental procedure -discussed that she had a root canal and it didn't take and she needed her tooth pulled and an implant placed -discussed that the titanium implant she felt irritated her -discussed that she developed shingles in her tongue -discussed that her gabapentin  was increased and she was prescribed mouthwash -discussed that it took weeks to heal -discussed that it was a big blow to her to have the tooth removed  7) Hypothyroidism: -continue Synthroid   8) Insomnia:  -continue melatonin  9) Migraines: -continue nurtec -discussed these may have worsened since topamax  was stopped -recommended avoiding gluten, dairy, eggs -recommended eating a lot of fruits and vegetables  -Discussed current symptoms of pain and history of pain.  -Discussed benefits of exercise in reducing pain. -Discussed following foods that may reduce pain: 1) Ginger (especially studied for arthritis)- reduce leukotriene production to decrease inflammation 2) Blueberries- high in phytonutrients that decrease inflammation 3) Salmon- marine omega-3s reduce joint swelling and pain 4) Pumpkin seeds- reduce inflammation 5) dark chocolate- reduces inflammation 6) turmeric- reduces inflammation 7) tart  cherries - reduce pain and stiffness 8) extra virgin olive oil - its compound  olecanthal helps to block prostaglandins  9) chili peppers- can be eaten or applied topically via capsaicin  10) mint- helpful for headache, muscle aches, joint pain, and itching 11) garlic- reduces inflammation 12) Green tea- reduces inflammation and oxidative stress, helps with weight loss, may reduce the risk of cancer, recommend Double Green Matcha [place] of Tea daily  Link to further information on diet for chronic pain: [email or web address].[email or web address]    10) Anxiety:  -continue Klonopin 

## 2024-07-05 ENCOUNTER — Encounter: Attending: Physical Medicine and Rehabilitation | Admitting: Physical Medicine and Rehabilitation

## 2024-07-05 ENCOUNTER — Encounter: Payer: Self-pay | Admitting: Physical Medicine and Rehabilitation

## 2024-07-05 VITALS — BP 112/67 | HR 70 | Ht 66.0 in | Wt 179.6 lb

## 2024-07-05 DIAGNOSIS — G43809 Other migraine, not intractable, without status migrainosus: Secondary | ICD-10-CM | POA: Insufficient documentation

## 2024-07-05 DIAGNOSIS — E063 Autoimmune thyroiditis: Secondary | ICD-10-CM | POA: Insufficient documentation

## 2024-07-05 DIAGNOSIS — M792 Neuralgia and neuritis, unspecified: Secondary | ICD-10-CM | POA: Diagnosis not present

## 2024-07-05 DIAGNOSIS — F411 Generalized anxiety disorder: Secondary | ICD-10-CM | POA: Insufficient documentation

## 2024-07-11 ENCOUNTER — Telehealth: Payer: Self-pay | Admitting: Physician Assistant

## 2024-07-11 NOTE — Telephone Encounter (Signed)
 Called and left voicemail as well as sent a MyChart message for patient to call the office to reschedule the appointment for 07/31/24 due to the provider being out of the office.

## 2024-07-16 ENCOUNTER — Other Ambulatory Visit: Payer: Self-pay

## 2024-07-16 DIAGNOSIS — E782 Mixed hyperlipidemia: Secondary | ICD-10-CM

## 2024-07-16 MED ORDER — ROSUVASTATIN CALCIUM 5 MG PO TABS: 5.0000 mg | ORAL_TABLET | Freq: Every day | ORAL | 0 refills | Status: AC

## 2024-07-26 ENCOUNTER — Ambulatory Visit: Admitting: Physician Assistant

## 2024-07-26 VITALS — BP 120/70 | HR 73 | Temp 97.6°F | Resp 16 | Ht 66.0 in | Wt 176.0 lb

## 2024-07-26 DIAGNOSIS — R7303 Prediabetes: Secondary | ICD-10-CM

## 2024-07-26 DIAGNOSIS — E039 Hypothyroidism, unspecified: Secondary | ICD-10-CM

## 2024-07-26 DIAGNOSIS — F419 Anxiety disorder, unspecified: Secondary | ICD-10-CM

## 2024-07-26 DIAGNOSIS — M792 Neuralgia and neuritis, unspecified: Secondary | ICD-10-CM

## 2024-07-26 DIAGNOSIS — Z86711 Personal history of pulmonary embolism: Secondary | ICD-10-CM

## 2024-07-26 DIAGNOSIS — E782 Mixed hyperlipidemia: Secondary | ICD-10-CM | POA: Diagnosis not present

## 2024-07-26 DIAGNOSIS — E559 Vitamin D deficiency, unspecified: Secondary | ICD-10-CM

## 2024-07-26 DIAGNOSIS — L309 Dermatitis, unspecified: Secondary | ICD-10-CM

## 2024-07-26 MED ORDER — CLOBETASOL PROPIONATE 0.05 % EX SOLN: 1.0000 | Freq: Two times a day (BID) | CUTANEOUS | 0 refills | Status: AC

## 2024-07-26 NOTE — Progress Notes (Signed)
 Subjective:  Patient ID: [name], female    DOB: [birth year]  Age: 58 y.o. MRN: [id number]  Chief Complaint  Patient presents with   Medical Management of Chronic Issues    Hyperlipidemia  Anxiety      Pt with hypothyroidism - currently on synthroid  75 mcg and follows with endocrinology Dr Macky - voices no problems or concerns - due for labwork  Pt with anxiety -  currently stable on medication klonopin  and lexapro  20mg  - she has had some mild anxiety with recent loss of brother and husband being diagnosed with prostate cancer  Pt with  vit D deficiency - pt is due for repeat labwork -   Pt with hyperlipidemia - does watch diet - pt states she is taking crestor  5mg  qd and watching diet - due for labwork She had stopped medication for about a month to see if it made a difference in her foot pain but it did not  Pt is being followed by specialist for chronic foot pain/neuralgia - she is taking gabapentin  for pain which is being managed at this time  Pt with migraines - takes topamax  and imitrex  -  - currently not having issues  Pt with history of PE - does see pulmonologist and states overall her breathing is doing better - still on Eliquis  and is actually being referred to hematology for further evaluation  Pt requests refill of temovate  scalp solution - has mild rash in scalp and on bottom of neck Current Outpatient Medications on File Prior to Visit  Medication Sig Dispense Refill   albuterol  (VENTOLIN  HFA) 108 (90 Base) MCG/ACT inhaler Inhale 2 puffs into the lungs every 6 (six) hours as needed for wheezing or shortness of breath. 8 g 2   clonazePAM  (KLONOPIN ) 0.5 MG tablet TAKE 1 TABLET BY MOUTH EVERYDAY AT BEDTIME 30 tablet 0   ELIQUIS  5 MG TABS tablet TAKE 2 TABLETS (10MG ) TWICE DAILY FOR 7 DAYS, THEN 1 TABLET (5MG ) TWICE DAILY 60 tablet 3   escitalopram  (LEXAPRO ) 20 MG tablet TAKE 1 TABLET BY MOUTH EVERY DAY 90 tablet 1   famotidine  (PEPCID ) 40 MG tablet Take 40  mg by mouth daily as needed for heartburn or indigestion.     fexofenadine (ALLEGRA) 180 MG tablet Take 180 mg by mouth daily.     gabapentin  (NEURONTIN ) 300 MG capsule TAKE 1 CAPSULE BY MOUTH TWICE A DAY 60 capsule 3   ipratropium-albuterol  (DUONEB) 0.5-2.5 (3) MG/3ML SOLN Take 3 mLs by nebulization every 4 (four) hours as needed. 360 mL 0   levothyroxine  (SYNTHROID ) 75 MCG tablet Take 1 tablet (75 mcg total) by mouth daily. 90 tablet 3   polyethylene glycol (MIRALAX / GLYCOLAX) 17 g packet Take 17 g by mouth daily.     Rimegepant Sulfate (NURTEC) 75 MG TBDP Take 1 tablet (75 mg total) by mouth every other day. 16 tablet 3   rosuvastatin  (CRESTOR ) 5 MG tablet Take 1 tablet (5 mg total) by mouth daily. 90 tablet 0   SUMAtriptan  (IMITREX ) 100 MG tablet Take 1 tablet (100 mg total) by mouth every 2 (two) hours as needed for migraine. May repeat in 2 hours if headache persists or recurs. 9 tablet 2   No current facility-administered medications on file prior to visit.   Past Medical History:  Diagnosis Date   Atrophy of thyroid  (acquired)    Autoimmune thyroiditis    Chronic fatigue, unspecified    Gastro-esophageal reflux disease without esophagitis  Generalized anxiety disorder    Menopausal and female climacteric states    Migraine without aura, not intractable, without status migrainosus    Peripheral vascular disease 1992   blood clot with broken leg, coumadin x 6 mos   Vitamin D  deficiency, unspecified    Past Surgical History:  Procedure Laterality Date   Nova sure ablation  2012   rotator cuff surgery  12/2014   TONSILLECTOMY     as a child    Family History  Problem Relation Age of Onset   Hypertension Mother    Hyperlipidemia Mother    Breast cancer Mother    Melanoma Mother    Migraines Mother    Diabetes type II Mother    Lung cancer Father    Hypertension Sister    Heart attack Maternal Grandmother    Heart attack Maternal Grandfather    Stroke Maternal  Grandfather    Social History   Socioeconomic History   Marital status: Married    Spouse name: Keenan Dimitrov   Number of children: 2   Years of education: 14   Highest education level: Associate degree: occupational, scientist, product/process development, or vocational program  Occupational History   Occupation: Hair stylist  Tobacco Use   Smoking status: Never    Passive exposure: Past   Smokeless tobacco: Never  Vaping Use   Vaping status: Never Used  Substance and Sexual Activity   Alcohol use: Yes    Alcohol/week: 1.0 standard drink of alcohol    Types: 1 Glasses of wine per week   Drug use: Never   Sexual activity: Yes    Partners: Male    Birth control/protection: Surgical  Other Topics Concern   Not on file  Social History Narrative   ** Merged History Encounter **       Right-handedness One-story home   Social Drivers of Health   Tobacco Use: Low Risk (07/26/2024)   Patient History    Smoking Tobacco Use: Never    Smokeless Tobacco Use: Never    Passive Exposure: Past  Financial Resource Strain: Low Risk (07/26/2024)   Overall Financial Resource Strain (CARDIA)    Difficulty of Paying Living Expenses: Not hard at all  Food Insecurity: No Food Insecurity (07/26/2024)   Epic    Worried About Programme Researcher, Broadcasting/film/video in the Last Year: Never true    Ran Out of Food in the Last Year: Never true  Transportation Needs: No Transportation Needs (07/26/2024)   Epic    Lack of Transportation (Medical): No    Lack of Transportation (Non-Medical): No  Physical Activity: Inactive (07/26/2024)   Exercise Vital Sign    Days of Exercise per Week: 0 days    Minutes of Exercise per Session: Not on file  Stress: Stress Concern Present (07/26/2024)   [employer] of Occupational Health - Occupational Stress Questionnaire    Feeling of Stress: Rather much  Social Connections: Socially Integrated (07/26/2024)   Social Connection and Isolation Panel    Frequency of Communication with Friends and  Family: More than three times a week    Frequency of Social Gatherings with Friends and Family: Three times a week    Attends Religious Services: More than 4 times per year    Active Member of Clubs or Organizations: Yes    Attends Banker Meetings: More than 4 times per year    Marital Status: Married  Depression (PHQ2-9): Low Risk (04/03/2024)   Depression (PHQ2-9)    PHQ-2 Score:  0  Alcohol Screen: Low Risk (04/06/2023)   Alcohol Screen    Last Alcohol Screening Score (AUDIT): 0  Housing: Unknown (07/26/2024)   Epic    Unable to Pay for Housing in the Last Year: No    Number of Times Moved in the Last Year: Not on file    Homeless in the Last Year: No  Utilities: Not At Risk (04/06/2023)   [facility] Utilities    Threatened with loss of utilities: No  Health Literacy: Adequate Health Literacy (04/06/2023)   B1300 Health Literacy    Frequency of need for help with medical instructions: Never    CONSTITUTIONAL: Negative for chills, fatigue, fever,  E/N/T: Negative for ear pain, nasal congestion and sore throat.  CARDIOVASCULAR: Negative for chest pain, dizziness, palpitations and pedal edema.  RESPIRATORY: Negative for recent cough and dyspnea.  GASTROINTESTINAL: Negative for abdominal pain, acid reflux symptoms, constipation, diarrhea, nausea and vomiting.  MSK: Negative for arthralgias and myalgias.  INTEGUMENTARY: see HPI NEUROLOGICAL: Negative for dizziness and headaches.  PSYCHIATRIC: Negative for sleep disturbance and to question depression screen.  Negative for depression, negative for anhedonia.       Objective:  PHYSICAL EXAM:   VS: BP 120/70   Pulse 73   Temp 97.6 F (36.4 C)   Resp 16   Ht 5' 6 (1.676 m)   Wt 176 lb (79.8 kg)   SpO2 97%   BMI 28.41 kg/m   GEN: Well nourished, well developed, in no acute distress  Cardiac: RRR; no murmurs, rubs, or gallops,no edema - Respiratory:  normal respiratory rate and pattern with no distress - normal breath  sounds with no rales, rhonchi, wheezes or rubs MS: no deformity or atrophy  Skin: slight erythema at nape of neck Neuro:  Alert and Oriented x 3, - CN II-Xii grossly intact Psych: euthymic mood, appropriate affect and demeanor   Lab Results  Component Value Date   WBC 6.5 01/25/2024   HGB 12.9 01/25/2024   HCT 40.1 01/25/2024   PLT 251 01/25/2024   GLUCOSE 86 01/25/2024   CHOL 162 01/25/2024   TRIG 123 01/25/2024   HDL 49 01/25/2024   LDLCALC 91 01/25/2024   ALT 18 01/25/2024   AST 22 01/25/2024   NA 141 01/25/2024   K 4.2 01/25/2024   CL 106 01/25/2024   CREATININE 0.64 01/25/2024   BUN 9 01/25/2024   CO2 21 01/25/2024   TSH 9.110 (H) 05/23/2024   HGBA1C 5.5 01/25/2024      Assessment & Plan:   Problem List Items Addressed This Visit       Endocrine   Acquired hypothyroidism Continue follow up with endocrinologist Continue syntrhoid 75mcg qd Labwork pending     Other   Vitamin D  insufficiency   Relevant Orders   VITAMIN D  25 Hydroxy (Vit-D Deficiency, Fractures)    Other Visit Diagnoses     Neuralgia    -  Primary   Relevant Orders   CBC with Differential/Platelet   Comprehensive metabolic panel Continue gabapentin  Follow up with specialist as directed   Anxiety     Continue current meds   Mixed hyperlipidemia       Relevant Orders   CBC with Differential/Platelet   Comprehensive metabolic panel   Lipid panel Watch diet Restart crestor   Prediabetes Continue to watch diet  History of PE  Continue Eliquis  5mg  1 po bid Follow up with pulmonology as directed  Dermatitis Refill temovate  scalp solution     .  Meds ordered this encounter  Medications   clobetasol  (TEMOVATE ) 0.05 % external solution    Sig: Apply 1 Application topically 2 (two) times daily.    Dispense:  50 mL    Refill:  0    Supervising Provider:   SHERRE CLAPPER [phone]    Orders Placed This Encounter  Procedures   CBC with Differential/Platelet   Comprehensive  metabolic panel with GFR   TSH   Lipid panel   Hemoglobin A1c   VITAMIN D  25 Hydroxy (Vit-D Deficiency, Fractures)     Follow-up: Return in about 6 months (around 01/24/2025) for chronic fasting follow-up.  An After Visit Summary was printed and given to the patient.  CAMIE JONELLE NICHOLAUS DEVONNA [facility] [phone]

## 2024-07-27 LAB — CBC WITH DIFFERENTIAL/PLATELET
Basophils Absolute: 0 x10E3/uL (ref 0.0–0.2)
Basos: 1 %
EOS (ABSOLUTE): 0.1 x10E3/uL (ref 0.0–0.4)
Eos: 2 %
Hematocrit: 40.9 % (ref 34.0–46.6)
Hemoglobin: 12.9 g/dL (ref 11.1–15.9)
Immature Grans (Abs): 0 x10E3/uL (ref 0.0–0.1)
Immature Granulocytes: 0 %
Lymphocytes Absolute: 2.2 x10E3/uL (ref 0.7–3.1)
Lymphs: 34 %
MCH: 30.2 pg (ref 26.6–33.0)
MCHC: 31.5 g/dL (ref 31.5–35.7)
MCV: 96 fL (ref 79–97)
Monocytes Absolute: 0.5 x10E3/uL (ref 0.1–0.9)
Monocytes: 8 %
Neutrophils Absolute: 3.7 x10E3/uL (ref 1.4–7.0)
Neutrophils: 55 %
Platelets: 253 x10E3/uL (ref 150–450)
RBC: 4.27 x10E6/uL (ref 3.77–5.28)
RDW: 12.9 % (ref 11.7–15.4)
WBC: 6.6 x10E3/uL (ref 3.4–10.8)

## 2024-07-27 LAB — LIPID PANEL
Chol/HDL Ratio: 3.9 ratio (ref 0.0–4.4)
Cholesterol, Total: 201 mg/dL — ABNORMAL HIGH (ref 100–199)
HDL: 51 mg/dL (ref >39)
LDL Chol Calc (NIH): 128 mg/dL — ABNORMAL HIGH (ref 0–99)
Triglycerides: 123 mg/dL (ref 0–149)
VLDL Cholesterol Cal: 22 mg/dL (ref 5–40)

## 2024-07-27 LAB — COMPREHENSIVE METABOLIC PANEL WITH GFR
ALT: 11 IU/L (ref 0–32)
AST: 16 IU/L (ref 0–40)
Albumin: 4.3 g/dL (ref 3.8–4.9)
Alkaline Phosphatase: 69 IU/L (ref 49–135)
BUN/Creatinine Ratio: 17 (ref 9–23)
BUN: 12 mg/dL (ref 6–24)
Bilirubin Total: 0.6 mg/dL (ref 0.0–1.2)
CO2: 24 mmol/L (ref 20–29)
Calcium: 9.2 mg/dL (ref 8.7–10.2)
Chloride: 104 mmol/L (ref 96–106)
Creatinine, Ser: 0.7 mg/dL (ref 0.57–1.00)
Globulin, Total: 2.2 g/dL (ref 1.5–4.5)
Glucose: 86 mg/dL (ref 70–99)
Potassium: 4.1 mmol/L (ref 3.5–5.2)
Sodium: 141 mmol/L (ref 134–144)
Total Protein: 6.5 g/dL (ref 6.0–8.5)
eGFR: 100 mL/min/1.73 (ref >59)

## 2024-07-27 LAB — TSH: TSH: 1.29 u[IU]/mL (ref 0.450–4.500)

## 2024-07-27 LAB — HEMOGLOBIN A1C
Est. average glucose Bld gHb Est-mCnc: 114 mg/dL
Hgb A1c MFr Bld: 5.6 % (ref 4.8–5.6)

## 2024-07-27 LAB — VITAMIN D 25 HYDROXY (VIT D DEFICIENCY, FRACTURES): Vit D, 25-Hydroxy: 33.5 ng/mL (ref 30.0–100.0)

## 2024-07-30 ENCOUNTER — Ambulatory Visit: Admitting: Physician Assistant

## 2024-07-31 ENCOUNTER — Ambulatory Visit: Admitting: Physician Assistant

## 2024-07-31 ENCOUNTER — Ambulatory Visit: Payer: Self-pay | Admitting: Physician Assistant

## 2024-08-01 ENCOUNTER — Other Ambulatory Visit: Payer: Self-pay | Admitting: Physician Assistant

## 2024-08-17 NOTE — Progress Notes (Signed)
 [name]  Patient Care Team: [name], [name] as PCP - General (Physician Assistant) Kaylee File, MD as Consulting Physician (Internal Medicine)   ASSESSMENT & PLAN:  59 y.o.female with history of PE, neuropathy, migraine, hypothyroid vitamin D  deficiency, anxiety, hyperlipidemia referred to [facility] for history of recurrent VTE.   Patient presented with history of recurrent VTE.  Initially with 2 provoked event in the right lower extremity.  More recently with unprovoked pulmonary aneurysmal thrombosis and symptoms resolved after Eliquis  anticoagulation.  Systemically she is feeling well and most recent CBC and CMP was normal.  We discussed provoked versus unprovoked setting.  We discussed about the pros and cons about testing for thrombophilia disorder. For known thrombophilia testing, most of which do not impact the risk of recurrence and do not change the therapeutic options or recommendations for duration of therapy.  Patient can still develop recurrent venous thrombosis with negative testing. For patients with unprovoked VTE, the risks of recurrent VTE after completing a course of anticoagulant therapy have been estimated to be 10% by 2 years and >30% by 10 years. ASH 2020. Therefore, I do not see a reason to order excessive testing to screen for thrombophilia disorder as it would not change our management. The goal of anticoagulation therapy is for life if able to tolerate. Assessment & Plan History of pulmonary embolism Last episode was unprovoked Tolerating anticoagulation, apixaban  well.  May continue as tolerated May stop anticoagulation about 2 days before procedures, or at instruction of surgeon. Postoperatively, resume at discretion of surgeon at hemostasis or the following day.  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  Prevention of clotting like deep vein thrombosis including: Strong risk factors including fractures of lower limb, hospitalization for severe illness, such as heart failure, myocardial infarction, spinal cord injury, major trauma, hip or knee replacement, and previous VTE. avoid prolonged immobilization and moving extremities every 1-2 hours during long car rides or flights.  Taking a break and moving extremities if working in a job setting with prolonged sitting.   Avoid dehydration, especially in high altitude. Avoid cigarette smoking Avoid use of hormone replacement therapy, estrogen-containing contraceptive in women.   Maintaining healthy lifestyle to prevent development of diabetes.  Weight loss if BMI over 30.  Regular exercises but not extreme.  Stay hydrated with exercises. Other risk factors for clotting are surgery, hospitalization, inflammatory disease or severe infection, trauma or injuries from inflammatory state and stasis. If developing one-sided leg swelling, pain, color change, chest pain, sudden short of breath, difficulty taking deep breaths, taking deep breath with chest discomfort or pain, dizziness or heart racing sensation, go to the emergency room immediately for evaluation. If developing trauma, uncontrolled bleeding, such as bloody stools report ED immediately. Avoid NSAIDs, aspirin  while on blood thinner.  I have not made a return appointment for the patient to come back. I would be happy to assist in perioperative DVT management in the future should she need any interruption of her anticoagulation therapy for elective procedures.  All questions were answered. The patient knows to call the clinic with any problems, questions or concerns.  Kaylee JAYSON Chihuahua, MD 1/5/202612:38 PM  CHIEF  COMPLAINTS/PURPOSE OF CONSULTATION:  Recurrent VTE  HISTORY OF PRESENTING ILLNESS:  Kaylee M  Medina 59 y.o. female is here because of history of recurrent VTE  Patient reports first episode was while on contraceptive, cast in right lower extremity in 1992.  She underwent 6 months of anticoagulation with Coumadin and resolution.  The next episode was in 2019 after local vascular procedure.  At that time, reports she sought Dr. Ezzard in [place].  She is unclear if she may have testing done but does not have results.  Report due to merged between home health and local hospital results cannot be obtained.  She is not entirely sure what kind of testing she may have been done already.  Records show she was on Xarelto at the time.  She reported brief course of anticoagulation and then discontinued.  She was doing fine until February 2025.  Reports she presented with sudden onset of right-sided chest pain, difficulty breathing.  Initially thought she had pneumonia.  She saw her primary care in the interim who ordered a D-dimer which was elevated.  She was sent to the emergency room for evaluation.  CTA showed   10/03/23.CTA 1. No embolism to the proximal subsegmental pulmonary artery level. 2. 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 The aneurysm is not opacified, suggesting thrombosed. This is of unknown clinical significance. 3. No lung mass, consolidation, pleural effusion or pneumothorax.  Patient was started on Eliquis .  Patient saw pulmonary.  Her chest pain quickly resolved over the next few days.  She has no recurrent symptoms.  More recently, she saw vascular surgery and was told that she should be referred to hematology again.  Plan to perform a local procedure for her lower extremity veins.  Patient denies extensive family history of DVT or PE.  One half sibling and 1 sibling without DVT.  1  child and 1 adopted without history of DVT or PE.  Parents did not have DVT or PE.  She had 1 episode of regnancy without DVT.  She is otherwise feeling well.  Review of system without any systemic symptoms.  She denied prolonged bleeding from anticoagulation and does not use any NSAIDs or aspirin .  She denies any active abdominal pain, GI bleeding, melena, hematochezia, vaginal bleeding or hematuria.  Some bruising that has not changed.  MEDICAL HISTORY:  Past Medical History:  Diagnosis Date   Atrophy of thyroid  (acquired)    Autoimmune thyroiditis    Chronic fatigue, unspecified    Gastro-esophageal reflux disease without esophagitis    Generalized anxiety disorder    Menopausal and female climacteric states    Migraine without aura, not intractable, without status migrainosus    Peripheral vascular disease 1992   blood clot with broken leg, coumadin x 6 mos   Vitamin D  deficiency, unspecified     SURGICAL HISTORY: Past Surgical History:  Procedure Laterality Date   Nova sure ablation  2012   rotator cuff surgery  12/2014   TONSILLECTOMY     as a child    SOCIAL HISTORY: Social History   Socioeconomic History   Marital status: Married    Spouse name: [name]   Number of children: 2   Years of education: 14   Highest education level: Associate degree: occupational, scientist, product/process development, or vocational program  Occupational History   Occupation: Hair stylist  Tobacco Use   Smoking status: Never    Passive exposure: Past   Smokeless tobacco: Never  Vaping Use   Vaping status:  Never Used  Substance and Sexual Activity   Alcohol use: Yes    Alcohol/week: 1.0 standard drink of alcohol    Types: 1 Glasses of wine per week   Drug use: Never   Sexual activity: Yes    Partners: Male    Birth control/protection: Surgical  Other Topics Concern   Not on Medina  Social History Narrative   ** Merged History Encounter **       Right-handedness One-story home   Social Drivers of  Health   Tobacco Use: Low Risk (07/26/2024)   Patient History    Smoking Tobacco Use: Never    Smokeless Tobacco Use: Never    Passive Exposure: Past  Financial Resource Strain: Low Risk (07/26/2024)   Overall Financial Resource Strain (CARDIA)    Difficulty of Paying Living Expenses: Not hard at all  Food Insecurity: No Food Insecurity (08/19/2024)   Epic    Worried About Radiation Protection Practitioner of Food in the Last Year: Never true    Ran Out of Food in the Last Year: Never true  Transportation Needs: No Transportation Needs (08/19/2024)   Epic    Lack of Transportation (Medical): No    Lack of Transportation (Non-Medical): No  Physical Activity: Inactive (07/26/2024)   Exercise Vital Sign    Days of Exercise per Week: 0 days    Minutes of Exercise per Session: Not on Medina  Stress: Stress Concern Present (07/26/2024)   [employer] of Occupational Health - Occupational Stress Questionnaire    Feeling of Stress: Rather much  Social Connections: Socially Integrated (07/26/2024)   Social Connection and Isolation Panel    Frequency of Communication with Friends and Family: More than three times a week    Frequency of Social Gatherings with Friends and Family: Three times a week    Attends Religious Services: More than 4 times per year    Active Member of Clubs or Organizations: Yes    Attends Banker Meetings: More than 4 times per year    Marital Status: Married  Catering Manager Violence: Not At Risk (08/20/2024)   Epic    Fear of Current or Ex-Partner: No    Emotionally Abused: No    Physically Abused: No    Sexually Abused: No  Depression (PHQ2-9): Low Risk (08/20/2024)   Depression (PHQ2-9)    PHQ-2 Score: 0  Alcohol Screen: Low Risk (04/06/2023)   Alcohol Screen    Last Alcohol Screening Score (AUDIT): 0  Housing: Unknown (08/19/2024)   Epic    Unable to Pay for Housing in the Last Year: No    Number of Times Moved in the Last Year: Not on Medina    Homeless in the Last  Year: No  Utilities: Not At Risk (08/19/2024)   Epic    Threatened with loss of utilities: No  Health Literacy: Adequate Health Literacy (04/06/2023)   B1300 Health Literacy    Frequency of need for help with medical instructions: Never    FAMILY HISTORY: Family History  Problem Relation Age of Onset   Hypertension Mother    Hyperlipidemia Mother    Breast cancer Mother    Melanoma Mother    Migraines Mother    Diabetes type II Mother    Lung cancer Father    Hypertension Sister    Heart attack Maternal Grandmother    Heart attack Maternal Grandfather    Stroke Maternal Grandfather     ALLERGIES:  has no known allergies.  MEDICATIONS:  Current  Outpatient Medications  Medication Sig Dispense Refill   albuterol  (VENTOLIN  HFA) 108 (90 Base) MCG/ACT inhaler Inhale 2 puffs into the lungs every 6 (six) hours as needed for wheezing or shortness of breath. 8 g 2   clobetasol  (TEMOVATE ) 0.05 % external solution Apply 1 Application topically 2 (two) times daily. 50 mL 0   clonazePAM  (KLONOPIN ) 0.5 MG tablet TAKE 1 TABLET BY MOUTH EVERYDAY AT BEDTIME 30 tablet 0   ELIQUIS  5 MG TABS tablet TAKE 2 TABLETS (10MG ) TWICE DAILY FOR 7 DAYS, THEN 1 TABLET (5MG ) TWICE DAILY 60 tablet 3   escitalopram  (LEXAPRO ) 20 MG tablet TAKE 1 TABLET BY MOUTH EVERY DAY 90 tablet 1   famotidine  (PEPCID ) 40 MG tablet Take 40 mg by mouth daily as needed for heartburn or indigestion.     fexofenadine (ALLEGRA) 180 MG tablet Take 180 mg by mouth daily.     gabapentin  (NEURONTIN ) 300 MG capsule TAKE 1 CAPSULE BY MOUTH TWICE A DAY 60 capsule 3   ipratropium-albuterol  (DUONEB) 0.5-2.5 (3) MG/3ML SOLN Take 3 mLs by nebulization every 4 (four) hours as needed. 360 mL 0   levothyroxine  (SYNTHROID ) 75 MCG tablet Take 1 tablet (75 mcg total) by mouth daily. 90 tablet 3   polyethylene glycol (MIRALAX / GLYCOLAX) 17 g packet Take 17 g by mouth daily.     Rimegepant Sulfate (NURTEC) 75 MG TBDP Take 1 tablet (75 mg total) by  mouth every other day. 16 tablet 3   rosuvastatin  (CRESTOR ) 5 MG tablet Take 1 tablet (5 mg total) by mouth daily. 90 tablet 0   SUMAtriptan  (IMITREX ) 100 MG tablet Take 1 tablet (100 mg total) by mouth every 2 (two) hours as needed for migraine. May repeat in 2 hours if headache persists or recurs. 9 tablet 2   No current facility-administered medications for this visit.    REVIEW OF SYSTEMS:   All relevant systems were reviewed with the patient and are negative.  Vitals:   08/20/24 1000  BP: 110/68  Pulse: 74  Resp: 17  Temp: (!) 97.3 F (36.3 C)  SpO2: 100%   Filed Weights   08/20/24 1000  Weight: 175 lb 8 oz (79.6 kg)    GENERAL: alert, no distress and comfortable SKIN: skin color normal LUNGS: Effort normal and no respiratory distress.  Clear to auscultation HEART: regular rate & rhythm ABDOMEN: abdomen soft, non-tender Lymph: No palpable cervical lymphadenopathy Musculoskeletal: no cyanosis and edema.  Right lower extremity no edema, left lower extremity no edema  LABORATORY DATA:  I have reviewed the data as listed   RADIOGRAPHIC STUDIES: I have personally reviewed the radiological images as listed and agreed with the findings in the report.

## 2024-08-20 ENCOUNTER — Inpatient Hospital Stay

## 2024-08-20 VITALS — BP 110/68 | HR 74 | Temp 97.3°F | Resp 17 | Ht 66.0 in | Wt 175.5 lb

## 2024-08-20 DIAGNOSIS — Z7901 Long term (current) use of anticoagulants: Secondary | ICD-10-CM | POA: Insufficient documentation

## 2024-08-20 DIAGNOSIS — E063 Autoimmune thyroiditis: Secondary | ICD-10-CM | POA: Insufficient documentation

## 2024-08-20 DIAGNOSIS — E034 Atrophy of thyroid (acquired): Secondary | ICD-10-CM | POA: Diagnosis not present

## 2024-08-20 DIAGNOSIS — E785 Hyperlipidemia, unspecified: Secondary | ICD-10-CM | POA: Diagnosis not present

## 2024-08-20 DIAGNOSIS — Z86711 Personal history of pulmonary embolism: Secondary | ICD-10-CM | POA: Diagnosis not present

## 2024-08-20 NOTE — Patient Instructions (Addendum)
 Patient education for risk factors and prevention of clotting We talked about modifiable risk factors.  Prevention of clotting like deep vein thrombosis including: Strong risk factors including fractures of lower limb, hospitalization for severe illness, such as heart failure, myocardial infarction, spinal cord injury, major trauma, hip or knee replacement, and previous clotting. avoid prolonged immobilization and moving extremities every 1-2 hours during long car rides or flights.  Taking a break and moving extremities if working in a job setting with prolonged sitting.   Avoid dehydration, especially in high altitude. Avoid cigarette smoking Avoid use of hormone replacement therapy, estrogen-containing contraceptive in women.   Maintaining healthy lifestyle to prevent development of diabetes.  Weight loss if BMI over 30.  Regular exercises but not extreme.  Stay hydrated with exercises. Other risk factors for clotting are surgery, hospitalization, inflammatory disease or severe infection, trauma or injuries from inflammatory state and stasis. If developing one-sided leg swelling, pain, color change, chest pain, sudden short of breath, difficulty taking deep breaths, taking deep breath with chest discomfort or pain, dizziness or heart racing sensation, go to the emergency room immediately for evaluation. If developing trauma, uncontrolled bleeding, such as bloody stools report ED immediately. Avoid ibuprofen and alike, aspirin  while on blood thinner. Ok to hold blood thinner 2 days before procedures or at instruction of careers adviser. After procedures, resume the next day or when surgeon feels safe.

## 2024-08-20 NOTE — Assessment & Plan Note (Addendum)
 Last episode was unprovoked Tolerating anticoagulation, apixaban  well.  May continue as tolerated May stop anticoagulation about 2 days before procedures, or at instruction of surgeon. Postoperatively, resume at discretion of surgeon at hemostasis or the following day.

## 2024-08-28 ENCOUNTER — Telehealth: Payer: Self-pay

## 2024-08-28 MED ORDER — GABAPENTIN 300 MG PO CAPS: 300.0000 mg | ORAL_CAPSULE | Freq: Two times a day (BID) | ORAL | 3 refills | Status: AC

## 2024-08-28 NOTE — Telephone Encounter (Signed)
"  Refill    Gabapentin   "

## 2024-08-30 NOTE — Addendum Note (Signed)
 Addended by: Guy Toney M on: 08/30/2024 12:55 PM   Modules accepted: Orders

## 2024-08-30 NOTE — Telephone Encounter (Signed)
 Rx sent by Dr. Lorilee

## 2024-09-15 ENCOUNTER — Other Ambulatory Visit: Payer: Self-pay | Admitting: Family Medicine

## 2024-09-25 ENCOUNTER — Other Ambulatory Visit: Payer: Self-pay | Admitting: Physician Assistant

## 2024-09-25 DIAGNOSIS — R7989 Other specified abnormal findings of blood chemistry: Secondary | ICD-10-CM

## 2024-10-05 ENCOUNTER — Encounter: Admitting: Physical Medicine and Rehabilitation

## 2024-10-15 ENCOUNTER — Other Ambulatory Visit: Payer: Self-pay | Admitting: Physician Assistant

## 2024-10-15 DIAGNOSIS — E782 Mixed hyperlipidemia: Secondary | ICD-10-CM

## 2024-10-18 ENCOUNTER — Other Ambulatory Visit: Payer: Self-pay | Admitting: Physician Assistant

## 2024-10-22 ENCOUNTER — Ambulatory Visit: Payer: Self-pay

## 2024-10-24 ENCOUNTER — Encounter: Payer: Self-pay | Admitting: Physician Assistant

## 2024-10-24 ENCOUNTER — Ambulatory Visit: Admitting: Physician Assistant

## 2024-10-24 VITALS — BP 112/70 | HR 69 | Temp 97.3°F | Resp 16 | Ht 66.0 in | Wt 178.0 lb

## 2024-10-24 DIAGNOSIS — K140 Glossitis: Secondary | ICD-10-CM | POA: Diagnosis not present

## 2024-10-24 MED ORDER — NYSTATIN 100000 UNIT/ML MT SUSP: 5.0000 mL | Freq: Four times a day (QID) | ORAL | 1 refills | Status: AC

## 2024-10-25 ENCOUNTER — Other Ambulatory Visit: Payer: Self-pay | Admitting: Physician Assistant

## 2024-10-25 ENCOUNTER — Ambulatory Visit: Payer: Self-pay | Admitting: Physician Assistant

## 2024-10-25 DIAGNOSIS — E039 Hypothyroidism, unspecified: Secondary | ICD-10-CM

## 2024-10-25 LAB — FE+CBC/D/PLT+TIBC+FER+RETIC
Basophils Absolute: 0 10*3/uL (ref 0.0–0.2)
Basos: 1 %
EOS (ABSOLUTE): 0.2 10*3/uL (ref 0.0–0.4)
Eos: 3 %
Ferritin: 124 ng/mL (ref 15–150)
Hematocrit: 38.6 % (ref 34.0–46.6)
Hemoglobin: 12.4 g/dL (ref 11.1–15.9)
Immature Grans (Abs): 0 10*3/uL (ref 0.0–0.1)
Immature Granulocytes: 0 %
Iron Saturation: 23 % (ref 15–55)
Iron: 64 ug/dL (ref 27–159)
Lymphocytes Absolute: 2.3 10*3/uL (ref 0.7–3.1)
Lymphs: 40 %
MCH: 30.8 pg (ref 26.6–33.0)
MCHC: 32.1 g/dL (ref 31.5–35.7)
MCV: 96 fL (ref 79–97)
Monocytes Absolute: 0.5 10*3/uL (ref 0.1–0.9)
Monocytes: 8 %
Neutrophils Absolute: 2.8 10*3/uL (ref 1.4–7.0)
Neutrophils: 48 %
Platelets: 244 10*3/uL (ref 150–450)
RBC: 4.03 x10E6/uL (ref 3.77–5.28)
RDW: 12.7 % (ref 11.7–15.4)
Retic Ct Pct: 1.5 % (ref 0.6–2.6)
Total Iron Binding Capacity: 280 ug/dL (ref 250–450)
UIBC: 216 ug/dL (ref 131–425)
WBC: 5.9 10*3/uL (ref 3.4–10.8)

## 2024-10-25 LAB — B12 AND FOLATE PANEL
Folate: 15.9 ng/mL
Vitamin B-12: 984 pg/mL (ref 232–1245)

## 2024-10-25 LAB — TSH: TSH: 0.27 u[IU]/mL — ABNORMAL LOW (ref 0.450–4.500)

## 2024-10-25 MED ORDER — LEVOTHYROXINE SODIUM 50 MCG PO TABS: 50.0000 ug | ORAL_TABLET | Freq: Every day | ORAL | 0 refills | Status: AC

## 2024-11-12 LAB — HM MAMMOGRAPHY

## 2024-11-27 ENCOUNTER — Encounter: Payer: Self-pay | Admitting: Physical Medicine and Rehabilitation

## 2024-11-27 ENCOUNTER — Other Ambulatory Visit: Payer: Self-pay

## 2024-11-27 ENCOUNTER — Encounter: Admitting: Physical Medicine and Rehabilitation

## 2024-11-27 VITALS — BP 100/65 | HR 69 | Ht 66.0 in | Wt 180.2 lb

## 2024-11-27 DIAGNOSIS — M792 Neuralgia and neuritis, unspecified: Secondary | ICD-10-CM

## 2024-11-27 DIAGNOSIS — M797 Fibromyalgia: Secondary | ICD-10-CM

## 2024-11-27 DIAGNOSIS — G43809 Other migraine, not intractable, without status migrainosus: Secondary | ICD-10-CM

## 2024-11-27 MED ORDER — GABAPENTIN 300 MG PO CAPS: 300.0000 mg | ORAL_CAPSULE | Freq: Two times a day (BID) | ORAL | 3 refills | Status: AC

## 2024-11-27 MED ORDER — SUMATRIPTAN SUCCINATE 100 MG PO TABS: 100.0000 mg | ORAL_TABLET | ORAL | 2 refills | Status: AC | PRN

## 2024-12-17 ENCOUNTER — Ambulatory Visit: Admitting: Internal Medicine

## 2024-12-18 ENCOUNTER — Ambulatory Visit: Admitting: Internal Medicine

## 2025-01-30 ENCOUNTER — Ambulatory Visit: Admitting: Physician Assistant

## 2025-02-26 ENCOUNTER — Encounter: Admitting: Physical Medicine and Rehabilitation
